# Patient Record
Sex: Female | Born: 1935 | Race: White | Hispanic: No | Marital: Married | Smoking: Never smoker
Health system: Southern US, Community
[De-identification: ages and names within clinical notes are randomized; demographics above are authoritative.]

## PROBLEM LIST (undated history)

## (undated) DIAGNOSIS — I10 Essential (primary) hypertension: Secondary | ICD-10-CM

## (undated) DIAGNOSIS — I4891 Unspecified atrial fibrillation: Secondary | ICD-10-CM

## (undated) DIAGNOSIS — H3530 Unspecified macular degeneration: Secondary | ICD-10-CM

## (undated) DIAGNOSIS — I471 Supraventricular tachycardia, unspecified: Secondary | ICD-10-CM

## (undated) DIAGNOSIS — I2510 Atherosclerotic heart disease of native coronary artery without angina pectoris: Secondary | ICD-10-CM

## (undated) DIAGNOSIS — E785 Hyperlipidemia, unspecified: Secondary | ICD-10-CM

## (undated) DIAGNOSIS — K219 Gastro-esophageal reflux disease without esophagitis: Secondary | ICD-10-CM

## (undated) HISTORY — DX: Supraventricular tachycardia, unspecified: I47.10

## (undated) HISTORY — DX: Supraventricular tachycardia: I47.1

## (undated) HISTORY — DX: Atherosclerotic heart disease of native coronary artery without angina pectoris: I25.10

## (undated) HISTORY — PX: TOTAL ABDOMINAL HYSTERECTOMY: SHX209

## (undated) HISTORY — DX: Unspecified macular degeneration: H35.30

## (undated) HISTORY — DX: Hyperlipidemia, unspecified: E78.5

## (undated) HISTORY — DX: Unspecified atrial fibrillation: I48.91

## (undated) HISTORY — DX: Gastro-esophageal reflux disease without esophagitis: K21.9

## (undated) HISTORY — DX: Essential (primary) hypertension: I10

---

## 2000-05-26 ENCOUNTER — Encounter: Admission: RE | Admit: 2000-05-26 | Discharge: 2000-05-26 | Payer: Self-pay | Admitting: Urology

## 2000-05-26 ENCOUNTER — Encounter: Payer: Self-pay | Admitting: Urology

## 2001-08-17 ENCOUNTER — Encounter: Payer: Self-pay | Admitting: Emergency Medicine

## 2001-08-17 ENCOUNTER — Ambulatory Visit (HOSPITAL_COMMUNITY): Admission: RE | Admit: 2001-08-17 | Discharge: 2001-08-17 | Payer: Self-pay | Admitting: Emergency Medicine

## 2001-09-20 ENCOUNTER — Encounter: Payer: Self-pay | Admitting: Emergency Medicine

## 2001-09-20 ENCOUNTER — Encounter: Admission: RE | Admit: 2001-09-20 | Discharge: 2001-09-20 | Payer: Self-pay | Admitting: Emergency Medicine

## 2001-09-21 ENCOUNTER — Encounter: Payer: Self-pay | Admitting: Emergency Medicine

## 2001-09-21 ENCOUNTER — Encounter: Admission: RE | Admit: 2001-09-21 | Discharge: 2001-09-21 | Payer: Self-pay | Admitting: Emergency Medicine

## 2002-04-13 ENCOUNTER — Ambulatory Visit (HOSPITAL_COMMUNITY): Admission: RE | Admit: 2002-04-13 | Discharge: 2002-04-13 | Payer: Self-pay | Admitting: Cardiology

## 2002-04-13 ENCOUNTER — Encounter: Payer: Self-pay | Admitting: Cardiology

## 2002-05-06 ENCOUNTER — Emergency Department (HOSPITAL_COMMUNITY): Admission: EM | Admit: 2002-05-06 | Discharge: 2002-05-06 | Payer: Self-pay | Admitting: Emergency Medicine

## 2002-05-06 ENCOUNTER — Encounter: Payer: Self-pay | Admitting: Emergency Medicine

## 2002-05-24 ENCOUNTER — Encounter: Admission: RE | Admit: 2002-05-24 | Discharge: 2002-05-24 | Payer: Self-pay | Admitting: Gastroenterology

## 2002-05-24 ENCOUNTER — Encounter: Payer: Self-pay | Admitting: Gastroenterology

## 2002-07-06 ENCOUNTER — Encounter (INDEPENDENT_AMBULATORY_CARE_PROVIDER_SITE_OTHER): Payer: Self-pay | Admitting: Specialist

## 2002-07-06 ENCOUNTER — Ambulatory Visit (HOSPITAL_COMMUNITY): Admission: RE | Admit: 2002-07-06 | Discharge: 2002-07-06 | Payer: Self-pay | Admitting: Gastroenterology

## 2003-05-04 ENCOUNTER — Encounter: Admission: RE | Admit: 2003-05-04 | Discharge: 2003-05-04 | Payer: Self-pay | Admitting: Emergency Medicine

## 2005-07-21 ENCOUNTER — Ambulatory Visit: Payer: Self-pay | Admitting: Cardiology

## 2005-08-05 ENCOUNTER — Ambulatory Visit: Payer: Self-pay

## 2005-08-05 ENCOUNTER — Encounter: Payer: Self-pay | Admitting: Cardiology

## 2005-08-20 ENCOUNTER — Ambulatory Visit: Payer: Self-pay | Admitting: Cardiology

## 2005-08-25 ENCOUNTER — Ambulatory Visit: Payer: Self-pay | Admitting: Internal Medicine

## 2005-11-05 ENCOUNTER — Ambulatory Visit: Payer: Self-pay | Admitting: Internal Medicine

## 2006-01-07 ENCOUNTER — Encounter: Admission: RE | Admit: 2006-01-07 | Discharge: 2006-01-07 | Payer: Self-pay | Admitting: Emergency Medicine

## 2006-02-16 ENCOUNTER — Ambulatory Visit: Payer: Self-pay | Admitting: Cardiology

## 2006-06-05 ENCOUNTER — Encounter: Admission: RE | Admit: 2006-06-05 | Discharge: 2006-06-05 | Payer: Self-pay | Admitting: Emergency Medicine

## 2006-06-24 ENCOUNTER — Ambulatory Visit: Payer: Self-pay | Admitting: Cardiology

## 2006-06-24 LAB — CONVERTED CEMR LAB
CO2: 30 meq/L (ref 19–32)
Calcium: 9.1 mg/dL (ref 8.4–10.5)
Chloride: 107 meq/L (ref 96–112)
Creatinine, Ser: 0.9 mg/dL (ref 0.4–1.2)
Glucose, Bld: 89 mg/dL (ref 70–99)

## 2006-06-29 ENCOUNTER — Ambulatory Visit: Payer: Self-pay | Admitting: Internal Medicine

## 2006-07-02 ENCOUNTER — Ambulatory Visit: Payer: Self-pay | Admitting: Cardiology

## 2006-07-07 ENCOUNTER — Ambulatory Visit: Payer: Self-pay | Admitting: Cardiology

## 2006-07-14 ENCOUNTER — Ambulatory Visit: Payer: Self-pay | Admitting: *Deleted

## 2006-07-21 ENCOUNTER — Ambulatory Visit: Payer: Self-pay | Admitting: Cardiology

## 2006-07-21 ENCOUNTER — Ambulatory Visit: Payer: Self-pay

## 2006-07-21 ENCOUNTER — Encounter: Payer: Self-pay | Admitting: Cardiology

## 2006-07-28 ENCOUNTER — Ambulatory Visit: Payer: Self-pay | Admitting: *Deleted

## 2006-08-03 ENCOUNTER — Ambulatory Visit: Payer: Self-pay | Admitting: Cardiology

## 2006-08-05 ENCOUNTER — Ambulatory Visit (HOSPITAL_COMMUNITY): Admission: RE | Admit: 2006-08-05 | Discharge: 2006-08-05 | Payer: Self-pay | Admitting: Cardiology

## 2006-08-05 ENCOUNTER — Ambulatory Visit: Payer: Self-pay | Admitting: Cardiology

## 2006-08-10 ENCOUNTER — Ambulatory Visit: Payer: Self-pay | Admitting: Cardiology

## 2006-08-24 ENCOUNTER — Ambulatory Visit: Payer: Self-pay | Admitting: Cardiology

## 2006-08-24 ENCOUNTER — Ambulatory Visit: Payer: Self-pay | Admitting: Internal Medicine

## 2006-08-28 ENCOUNTER — Ambulatory Visit: Payer: Self-pay | Admitting: Cardiology

## 2006-09-04 ENCOUNTER — Ambulatory Visit: Payer: Self-pay | Admitting: Internal Medicine

## 2006-09-11 ENCOUNTER — Ambulatory Visit: Payer: Self-pay | Admitting: Cardiology

## 2006-09-18 ENCOUNTER — Ambulatory Visit: Payer: Self-pay | Admitting: Cardiology

## 2006-09-25 ENCOUNTER — Ambulatory Visit: Payer: Self-pay | Admitting: Cardiovascular Disease

## 2006-09-29 ENCOUNTER — Ambulatory Visit: Payer: Self-pay | Admitting: Cardiology

## 2006-10-19 ENCOUNTER — Ambulatory Visit: Payer: Self-pay | Admitting: Cardiology

## 2006-11-09 ENCOUNTER — Ambulatory Visit: Payer: Self-pay | Admitting: Cardiology

## 2006-12-07 ENCOUNTER — Ambulatory Visit: Payer: Self-pay | Admitting: Cardiology

## 2006-12-28 ENCOUNTER — Ambulatory Visit: Payer: Self-pay | Admitting: Internal Medicine

## 2007-01-25 ENCOUNTER — Ambulatory Visit: Payer: Self-pay | Admitting: Internal Medicine

## 2007-02-04 ENCOUNTER — Ambulatory Visit: Payer: Self-pay | Admitting: Cardiology

## 2007-02-04 LAB — CONVERTED CEMR LAB
Basophils Absolute: 0 10*3/uL (ref 0.0–0.1)
Eosinophils Absolute: 0.2 10*3/uL (ref 0.0–0.6)
HCT: 36.8 % (ref 36.0–46.0)
Hemoglobin: 12.8 g/dL (ref 12.0–15.0)
Lymphocytes Relative: 25.2 % (ref 12.0–46.0)
MCHC: 34.7 g/dL (ref 30.0–36.0)
MCV: 90.3 fL (ref 78.0–100.0)
Monocytes Absolute: 0.5 10*3/uL (ref 0.2–0.7)
Neutro Abs: 3 10*3/uL (ref 1.4–7.7)
Neutrophils Relative %: 61.3 % (ref 43.0–77.0)
RDW: 14.4 % (ref 11.5–14.6)

## 2007-02-24 ENCOUNTER — Ambulatory Visit: Payer: Self-pay | Admitting: Cardiology

## 2007-03-24 ENCOUNTER — Ambulatory Visit: Payer: Self-pay | Admitting: Cardiovascular Disease

## 2007-04-14 ENCOUNTER — Ambulatory Visit: Payer: Self-pay | Admitting: Cardiology

## 2007-04-28 ENCOUNTER — Ambulatory Visit: Payer: Self-pay | Admitting: Cardiovascular Disease

## 2007-05-13 ENCOUNTER — Ambulatory Visit: Payer: Self-pay | Admitting: Cardiology

## 2007-05-26 ENCOUNTER — Ambulatory Visit: Payer: Self-pay | Admitting: Cardiology

## 2007-06-03 ENCOUNTER — Ambulatory Visit: Payer: Self-pay | Admitting: Cardiology

## 2007-06-10 ENCOUNTER — Ambulatory Visit: Payer: Self-pay | Admitting: Cardiology

## 2007-06-22 ENCOUNTER — Ambulatory Visit: Payer: Self-pay | Admitting: Cardiology

## 2007-06-29 ENCOUNTER — Ambulatory Visit (HOSPITAL_COMMUNITY): Admission: RE | Admit: 2007-06-29 | Discharge: 2007-06-29 | Payer: Self-pay | Admitting: Cardiology

## 2007-06-29 ENCOUNTER — Ambulatory Visit: Payer: Self-pay | Admitting: Cardiology

## 2007-07-09 ENCOUNTER — Ambulatory Visit: Payer: Self-pay | Admitting: Cardiology

## 2007-07-09 ENCOUNTER — Ambulatory Visit: Payer: Self-pay

## 2007-07-13 ENCOUNTER — Ambulatory Visit: Payer: Self-pay | Admitting: Internal Medicine

## 2007-08-10 ENCOUNTER — Ambulatory Visit: Payer: Self-pay | Admitting: Cardiology

## 2007-08-24 ENCOUNTER — Ambulatory Visit: Payer: Self-pay | Admitting: Cardiology

## 2007-09-07 ENCOUNTER — Ambulatory Visit: Payer: Self-pay | Admitting: Cardiology

## 2007-09-22 ENCOUNTER — Ambulatory Visit: Payer: Self-pay | Admitting: Cardiology

## 2007-10-20 ENCOUNTER — Ambulatory Visit: Payer: Self-pay | Admitting: Cardiovascular Disease

## 2007-11-17 ENCOUNTER — Ambulatory Visit: Payer: Self-pay | Admitting: Cardiology

## 2007-12-01 ENCOUNTER — Ambulatory Visit: Payer: Self-pay | Admitting: Cardiovascular Disease

## 2007-12-22 ENCOUNTER — Ambulatory Visit: Payer: Self-pay | Admitting: Cardiovascular Disease

## 2008-01-24 ENCOUNTER — Ambulatory Visit: Payer: Self-pay | Admitting: Cardiology

## 2008-02-13 DIAGNOSIS — R002 Palpitations: Secondary | ICD-10-CM | POA: Insufficient documentation

## 2008-02-13 DIAGNOSIS — E7800 Pure hypercholesterolemia, unspecified: Secondary | ICD-10-CM | POA: Insufficient documentation

## 2008-02-13 DIAGNOSIS — I471 Supraventricular tachycardia, unspecified: Secondary | ICD-10-CM | POA: Insufficient documentation

## 2008-02-13 DIAGNOSIS — I10 Essential (primary) hypertension: Secondary | ICD-10-CM | POA: Insufficient documentation

## 2008-02-13 DIAGNOSIS — K219 Gastro-esophageal reflux disease without esophagitis: Secondary | ICD-10-CM | POA: Insufficient documentation

## 2008-02-13 DIAGNOSIS — I4891 Unspecified atrial fibrillation: Secondary | ICD-10-CM | POA: Insufficient documentation

## 2008-02-21 ENCOUNTER — Ambulatory Visit: Payer: Self-pay | Admitting: Cardiology

## 2008-03-24 ENCOUNTER — Ambulatory Visit: Payer: Self-pay | Admitting: Cardiovascular Disease

## 2008-04-03 ENCOUNTER — Ambulatory Visit: Payer: Self-pay | Admitting: Cardiology

## 2008-04-05 ENCOUNTER — Encounter: Admission: RE | Admit: 2008-04-05 | Discharge: 2008-04-05 | Payer: Self-pay | Admitting: Emergency Medicine

## 2008-04-17 ENCOUNTER — Encounter: Admission: RE | Admit: 2008-04-17 | Discharge: 2008-04-17 | Payer: Self-pay | Admitting: Emergency Medicine

## 2008-04-18 ENCOUNTER — Ambulatory Visit: Payer: Self-pay | Admitting: Cardiology

## 2008-04-18 LAB — CONVERTED CEMR LAB
Basophils Absolute: 0 10*3/uL (ref 0.0–0.1)
Basophils Relative: 0.2 % (ref 0.0–3.0)
Eosinophils Absolute: 0.2 10*3/uL (ref 0.0–0.7)
MCHC: 34.6 g/dL (ref 30.0–36.0)
MCV: 92.2 fL (ref 78.0–100.0)
Monocytes Relative: 12.7 % — ABNORMAL HIGH (ref 3.0–12.0)
Neutro Abs: 1.8 10*3/uL (ref 1.4–7.7)
Neutrophils Relative %: 51.6 % (ref 43.0–77.0)
RBC: 4.33 M/uL (ref 3.87–5.11)

## 2008-05-02 ENCOUNTER — Ambulatory Visit: Payer: Self-pay | Admitting: Cardiology

## 2008-05-02 LAB — CONVERTED CEMR LAB
Hemoglobin: 12.5 g/dL (ref 12.0–15.0)
Lymphocytes Relative: 27.1 % (ref 12.0–46.0)
Monocytes Relative: 10.5 % (ref 3.0–12.0)
Platelets: 216 10*3/uL (ref 150–400)
RDW: 13.5 % (ref 11.5–14.6)

## 2008-05-16 ENCOUNTER — Ambulatory Visit: Payer: Self-pay | Admitting: Cardiology

## 2008-06-13 ENCOUNTER — Ambulatory Visit: Payer: Self-pay | Admitting: Cardiology

## 2008-07-12 ENCOUNTER — Ambulatory Visit: Payer: Self-pay | Admitting: Cardiology

## 2008-07-12 LAB — CONVERTED CEMR LAB
Basophils Relative: 0.8 % (ref 0.0–3.0)
Eosinophils Relative: 3.6 % (ref 0.0–5.0)
HCT: 39.3 % (ref 36.0–46.0)
Hemoglobin: 13.3 g/dL (ref 12.0–15.0)
Lymphs Abs: 1.1 10*3/uL (ref 0.7–4.0)
MCV: 91.8 fL (ref 78.0–100.0)
Monocytes Absolute: 0.5 10*3/uL (ref 0.1–1.0)
RBC: 4.28 M/uL (ref 3.87–5.11)
WBC: 4.6 10*3/uL (ref 4.5–10.5)

## 2008-08-09 ENCOUNTER — Ambulatory Visit: Payer: Self-pay | Admitting: Internal Medicine

## 2008-08-28 ENCOUNTER — Encounter (INDEPENDENT_AMBULATORY_CARE_PROVIDER_SITE_OTHER): Payer: Self-pay | Admitting: *Deleted

## 2008-09-06 ENCOUNTER — Ambulatory Visit: Payer: Self-pay | Admitting: Cardiology

## 2008-09-12 ENCOUNTER — Encounter: Payer: Self-pay | Admitting: *Deleted

## 2008-10-09 ENCOUNTER — Encounter (INDEPENDENT_AMBULATORY_CARE_PROVIDER_SITE_OTHER): Payer: Self-pay | Admitting: Cardiology

## 2008-10-09 ENCOUNTER — Ambulatory Visit: Payer: Self-pay | Admitting: Cardiovascular Disease

## 2008-10-09 ENCOUNTER — Ambulatory Visit: Payer: Self-pay | Admitting: Cardiology

## 2008-10-18 ENCOUNTER — Encounter: Payer: Self-pay | Admitting: *Deleted

## 2008-11-06 ENCOUNTER — Ambulatory Visit: Payer: Self-pay | Admitting: Cardiovascular Disease

## 2008-12-04 ENCOUNTER — Ambulatory Visit: Payer: Self-pay | Admitting: Cardiology

## 2008-12-04 LAB — CONVERTED CEMR LAB: POC INR: 3.1

## 2008-12-14 ENCOUNTER — Telehealth: Payer: Self-pay | Admitting: Cardiology

## 2008-12-19 ENCOUNTER — Telehealth: Payer: Self-pay | Admitting: Cardiology

## 2008-12-27 ENCOUNTER — Telehealth: Payer: Self-pay | Admitting: Cardiology

## 2009-01-01 ENCOUNTER — Telehealth (INDEPENDENT_AMBULATORY_CARE_PROVIDER_SITE_OTHER): Payer: Self-pay | Admitting: *Deleted

## 2009-01-01 ENCOUNTER — Ambulatory Visit: Payer: Self-pay | Admitting: Cardiology

## 2009-01-01 LAB — CONVERTED CEMR LAB: POC INR: 4.4

## 2009-01-03 ENCOUNTER — Telehealth: Payer: Self-pay | Admitting: Cardiology

## 2009-01-09 ENCOUNTER — Encounter: Admission: RE | Admit: 2009-01-09 | Discharge: 2009-01-09 | Payer: Self-pay | Admitting: Orthopedic Surgery

## 2009-01-15 ENCOUNTER — Ambulatory Visit: Payer: Self-pay | Admitting: Cardiovascular Disease

## 2009-02-05 ENCOUNTER — Ambulatory Visit: Payer: Self-pay | Admitting: Cardiology

## 2009-02-05 LAB — CONVERTED CEMR LAB: POC INR: 2.4

## 2009-03-05 ENCOUNTER — Ambulatory Visit: Payer: Self-pay | Admitting: Cardiology

## 2009-03-05 ENCOUNTER — Ambulatory Visit: Payer: Self-pay | Admitting: Internal Medicine

## 2009-03-05 LAB — CONVERTED CEMR LAB: POC INR: 2.9

## 2009-03-07 LAB — CONVERTED CEMR LAB
Albumin: 3.9 g/dL (ref 3.5–5.2)
Alkaline Phosphatase: 35 units/L — ABNORMAL LOW (ref 39–117)
Basophils Absolute: 0 10*3/uL (ref 0.0–0.1)
Eosinophils Absolute: 0.2 10*3/uL (ref 0.0–0.7)
HCT: 40 % (ref 36.0–46.0)
Lymphocytes Relative: 26.5 % (ref 12.0–46.0)
Lymphs Abs: 1.6 10*3/uL (ref 0.7–4.0)
Monocytes Relative: 4.9 % (ref 3.0–12.0)
Platelets: 200 10*3/uL (ref 150.0–400.0)
RDW: 13.7 % (ref 11.5–14.6)
TSH: 1.71 microintl units/mL (ref 0.35–5.50)

## 2009-03-13 ENCOUNTER — Ambulatory Visit: Payer: Self-pay | Admitting: Cardiology

## 2009-03-13 LAB — CONVERTED CEMR LAB: POC INR: 3.4

## 2009-03-23 ENCOUNTER — Ambulatory Visit: Payer: Self-pay | Admitting: Cardiology

## 2009-03-23 LAB — CONVERTED CEMR LAB: POC INR: 2.2

## 2009-03-27 ENCOUNTER — Ambulatory Visit: Payer: Self-pay | Admitting: Cardiology

## 2009-03-27 ENCOUNTER — Encounter (INDEPENDENT_AMBULATORY_CARE_PROVIDER_SITE_OTHER): Payer: Self-pay | Admitting: *Deleted

## 2009-03-28 ENCOUNTER — Ambulatory Visit (HOSPITAL_COMMUNITY): Admission: RE | Admit: 2009-03-28 | Discharge: 2009-03-28 | Payer: Self-pay | Admitting: Internal Medicine

## 2009-03-29 LAB — CONVERTED CEMR LAB
Basophils Relative: 0.6 % (ref 0.0–3.0)
Eosinophils Relative: 4.4 % (ref 0.0–5.0)
GFR calc non Af Amer: 57.58 mL/min (ref >60)
Glucose, Bld: 108 mg/dL — ABNORMAL HIGH (ref 70–99)
HCT: 38.6 % (ref 36.0–46.0)
Hemoglobin: 13 g/dL (ref 12.0–15.0)
Lymphocytes Relative: 26.2 % (ref 12.0–46.0)
Lymphs Abs: 1.5 10*3/uL (ref 0.7–4.0)
Monocytes Relative: 10.3 % (ref 3.0–12.0)
Neutro Abs: 3.3 10*3/uL (ref 1.4–7.7)
Potassium: 3.9 meq/L (ref 3.5–5.1)
RBC: 4.04 M/uL (ref 3.87–5.11)
Sodium: 140 meq/L (ref 135–145)
WBC: 5.6 10*3/uL (ref 4.5–10.5)

## 2009-04-10 ENCOUNTER — Ambulatory Visit: Payer: Self-pay | Admitting: Cardiology

## 2009-04-10 LAB — CONVERTED CEMR LAB: POC INR: 2.2

## 2009-05-08 ENCOUNTER — Ambulatory Visit: Payer: Self-pay | Admitting: Cardiovascular Disease

## 2009-05-08 LAB — CONVERTED CEMR LAB: POC INR: 2.1

## 2009-06-04 ENCOUNTER — Telehealth: Payer: Self-pay | Admitting: Cardiology

## 2009-06-05 ENCOUNTER — Ambulatory Visit: Payer: Self-pay | Admitting: Cardiology

## 2009-06-05 LAB — CONVERTED CEMR LAB: POC INR: 1.9

## 2009-06-12 ENCOUNTER — Ambulatory Visit: Payer: Self-pay | Admitting: Cardiology

## 2009-06-13 LAB — CONVERTED CEMR LAB
Bilirubin, Direct: 0.1 mg/dL (ref 0.0–0.3)
Total Bilirubin: 0.5 mg/dL (ref 0.3–1.2)

## 2009-07-10 ENCOUNTER — Ambulatory Visit: Payer: Self-pay | Admitting: Internal Medicine

## 2009-07-10 LAB — CONVERTED CEMR LAB: POC INR: 4.8

## 2009-07-19 ENCOUNTER — Telehealth (INDEPENDENT_AMBULATORY_CARE_PROVIDER_SITE_OTHER): Payer: Self-pay | Admitting: *Deleted

## 2009-07-24 ENCOUNTER — Ambulatory Visit: Payer: Self-pay | Admitting: Cardiovascular Disease

## 2009-07-24 LAB — CONVERTED CEMR LAB: POC INR: 3.4

## 2009-08-07 ENCOUNTER — Ambulatory Visit: Payer: Self-pay | Admitting: Cardiovascular Disease

## 2009-08-07 LAB — CONVERTED CEMR LAB: POC INR: 2.9

## 2009-08-20 ENCOUNTER — Telehealth: Payer: Self-pay | Admitting: Cardiology

## 2009-08-24 ENCOUNTER — Ambulatory Visit: Payer: Self-pay | Admitting: Cardiology

## 2009-08-27 ENCOUNTER — Ambulatory Visit: Payer: Self-pay | Admitting: Cardiovascular Disease

## 2009-08-27 ENCOUNTER — Ambulatory Visit: Payer: Self-pay | Admitting: Internal Medicine

## 2009-08-31 ENCOUNTER — Encounter: Payer: Self-pay | Admitting: Internal Medicine

## 2009-09-07 ENCOUNTER — Ambulatory Visit: Payer: Self-pay | Admitting: Internal Medicine

## 2009-09-07 LAB — CONVERTED CEMR LAB
BUN: 16 mg/dL (ref 6–23)
Basophils Absolute: 0 10*3/uL (ref 0.0–0.1)
Calcium: 8.9 mg/dL (ref 8.4–10.5)
Eosinophils Relative: 4.8 % (ref 0.0–5.0)
GFR calc non Af Amer: 64.12 mL/min (ref >60)
Glucose, Bld: 81 mg/dL (ref 70–99)
HCT: 35.3 % — ABNORMAL LOW (ref 36.0–46.0)
Hemoglobin: 12.2 g/dL (ref 12.0–15.0)
INR: 2.9 — ABNORMAL HIGH (ref 0.8–1.0)
Lymphocytes Relative: 29.4 % (ref 12.0–46.0)
Lymphs Abs: 1.2 10*3/uL (ref 0.7–4.0)
Monocytes Relative: 9.9 % (ref 3.0–12.0)
Platelets: 218 10*3/uL (ref 150.0–400.0)
Potassium: 4.3 meq/L (ref 3.5–5.1)
RDW: 15.6 % — ABNORMAL HIGH (ref 11.5–14.6)
Sodium: 143 meq/L (ref 135–145)
WBC: 4 10*3/uL — ABNORMAL LOW (ref 4.5–10.5)
aPTT: 39.1 s — ABNORMAL HIGH (ref 21.7–28.8)

## 2009-09-12 HISTORY — PX: ABLATION OF DYSRHYTHMIC FOCUS: SHX254

## 2009-09-13 ENCOUNTER — Ambulatory Visit (HOSPITAL_COMMUNITY): Admission: RE | Admit: 2009-09-13 | Discharge: 2009-09-13 | Payer: Self-pay | Admitting: Internal Medicine

## 2009-09-13 ENCOUNTER — Encounter: Payer: Self-pay | Admitting: Internal Medicine

## 2009-09-13 ENCOUNTER — Ambulatory Visit: Payer: Self-pay | Admitting: Internal Medicine

## 2009-09-14 ENCOUNTER — Ambulatory Visit (HOSPITAL_COMMUNITY): Admission: RE | Admit: 2009-09-14 | Discharge: 2009-09-15 | Payer: Self-pay | Admitting: Internal Medicine

## 2009-09-14 ENCOUNTER — Ambulatory Visit: Payer: Self-pay | Admitting: Internal Medicine

## 2009-09-17 ENCOUNTER — Telehealth: Payer: Self-pay | Admitting: Internal Medicine

## 2009-09-24 ENCOUNTER — Telehealth: Payer: Self-pay | Admitting: Internal Medicine

## 2009-09-27 ENCOUNTER — Telehealth: Payer: Self-pay | Admitting: Internal Medicine

## 2009-09-27 ENCOUNTER — Encounter: Payer: Self-pay | Admitting: Internal Medicine

## 2009-09-28 ENCOUNTER — Ambulatory Visit: Payer: Self-pay | Admitting: Cardiology

## 2009-09-28 ENCOUNTER — Ambulatory Visit (HOSPITAL_COMMUNITY): Admission: RE | Admit: 2009-09-28 | Discharge: 2009-09-28 | Payer: Self-pay | Admitting: Internal Medicine

## 2009-10-01 ENCOUNTER — Encounter: Payer: Self-pay | Admitting: Cardiology

## 2009-10-01 ENCOUNTER — Ambulatory Visit (HOSPITAL_COMMUNITY): Admission: RE | Admit: 2009-10-01 | Discharge: 2009-10-01 | Payer: Self-pay | Admitting: Cardiology

## 2009-10-01 ENCOUNTER — Ambulatory Visit: Payer: Self-pay | Admitting: Cardiology

## 2009-10-01 ENCOUNTER — Ambulatory Visit: Payer: Self-pay

## 2009-10-02 ENCOUNTER — Encounter: Payer: Self-pay | Admitting: Internal Medicine

## 2009-10-17 ENCOUNTER — Encounter: Payer: Self-pay | Admitting: Cardiology

## 2009-10-19 ENCOUNTER — Ambulatory Visit: Payer: Self-pay | Admitting: Cardiology

## 2009-10-19 ENCOUNTER — Ambulatory Visit: Payer: Self-pay | Admitting: Cardiovascular Disease

## 2009-10-19 LAB — CONVERTED CEMR LAB: POC INR: 2.8

## 2009-11-16 ENCOUNTER — Ambulatory Visit: Payer: Self-pay | Admitting: Cardiovascular Disease

## 2009-11-16 LAB — CONVERTED CEMR LAB: POC INR: 2

## 2009-11-19 ENCOUNTER — Ambulatory Visit: Payer: Self-pay | Admitting: Internal Medicine

## 2009-12-24 ENCOUNTER — Ambulatory Visit: Payer: Self-pay | Admitting: Internal Medicine

## 2010-01-18 ENCOUNTER — Ambulatory Visit: Payer: Self-pay | Admitting: Cardiology

## 2010-02-18 ENCOUNTER — Ambulatory Visit: Payer: Self-pay | Admitting: Internal Medicine

## 2010-02-18 ENCOUNTER — Ambulatory Visit: Payer: Self-pay | Admitting: Cardiovascular Disease

## 2010-03-18 ENCOUNTER — Ambulatory Visit: Payer: Self-pay | Admitting: Cardiovascular Disease

## 2010-04-16 ENCOUNTER — Ambulatory Visit: Admission: RE | Admit: 2010-04-16 | Discharge: 2010-04-16 | Payer: Self-pay | Source: Home / Self Care

## 2010-04-22 ENCOUNTER — Telehealth: Payer: Self-pay | Admitting: Internal Medicine

## 2010-04-30 ENCOUNTER — Ambulatory Visit: Admission: RE | Admit: 2010-04-30 | Discharge: 2010-04-30 | Payer: Self-pay | Source: Home / Self Care

## 2010-05-05 ENCOUNTER — Encounter: Payer: Self-pay | Admitting: Emergency Medicine

## 2010-05-14 NOTE — Medication Information (Signed)
Summary: ccr  Anticoagulant Therapy  Managed by: Kristine Linea, PharmD Referring MD: Olga Millers MD PCP: Jarome Matin, MD Supervising MD: Tenny Craw MD, Gunnar Fusi Indication 1: Atrial Fibrillation (ICD-427.31) Lab Used: [facility] Site: [facility] INR POC 2.6 INR RANGE 2 - 3  Dietary changes: no    Health status changes: no    Bleeding/hemorrhagic complications: no    Recent/future hospitalizations: no    Any changes in medication regimen? no    Recent/future dental: no  Any missed doses?: no       Is patient compliant with meds? yes       Anticoagulation Management History:      Positive risk factors for bleeding include an age of 75 years or older.  The bleeding index is 'intermediate risk'.  Positive CHADS2 values include History of HTN.  Negative CHADS2 values include Age > 61 years old.  The start date was 06/24/2006.  Her last INR was 2.9 ratio.  Anticoagulation responsible provider: Tenny Craw MD, Gunnar Fusi.  INR POC: 2.6.  Exp: 01/2011.    Anticoagulation Management Assessment/Plan:      The patient's current anticoagulation dose is Warfarin sodium 5 mg tabs: Take as directed by coumadin clinic..  The target INR is 2 - 3.  The next INR is due 01/18/2010.  Anticoagulation instructions were given to patient.  Results were reviewed/authorized by Kristine Linea, PharmD.         Prior Anticoagulation Instructions: INR 2.0  Continue Coumadin 1 tablet daily except 1/2 tablet Tue, Thu and Sat. Return to clinic in 4 weeks.  Current Anticoagulation Instructions: Continue taking 1/2 tablets (2.5mg ) on Tue, Thu, and Sat, and take one tablet (5mg ) the rest of the days. and recheck in 4 weeks.

## 2010-05-14 NOTE — Progress Notes (Signed)
Summary: pt not feeling well-EKG  Phone Note Call from Patient Call back at Home Phone [phone]   Caller: Spouse Reason for Call: Talk to Nurse, Talk to Doctor Summary of Call: pt has ablation on the 3rd her heart is still racing, nausea,tired,and sleeping a lot. Husband is concerned and wants to make sure it is normal and she is ok Initial call taken by: Omer Jack,  September 24, 2009 8:51 AM  Follow-up for Phone Call        c/o HR in the 80's to as high as 110.  She thinks she is out of rhythm now. I have asked her to come by for an EKG today. Dennis Bast, RN, BSN  September 24, 2009 9:33 AM per Dr Johney Frame if not in Rhythm by Thurs set up for DCCV     Appended Document: pt not feeling well-EKG per Dr Johney Frame increase Verapamil to 240mg  daily.  pt aware.  Will schedule for DCCV if not back in rhythm by end of week

## 2010-05-14 NOTE — Assessment & Plan Note (Signed)
Summary: d/c amiodarone and decrease Verapamil    Patient Instructions: 1)  Your physician recommends that you schedule a follow-up appointment in: 3 months with Dr Johney Frame 2)  Your physician has recommended you make the following change in your medication: stop Amiodarone and decrease Verapamil to 180mg  daily

## 2010-05-14 NOTE — Assessment & Plan Note (Signed)
Summary: 2 month rov/sl   Primary Provider:  Jarome Matin, MD  CC:  pt complains of pain on the right side of her body also arm  pain.  History of Present Illness: Sylvia [name] is a pleasant female who has a history of paroxysmal atrial fibrillation and SVT.  Her LV function is normal. Last echocardiogram in June of 2011 showed normal LV function. There was trivial aortic and mitral regurgitation.  Also note she had a cardiac catheterization in December 2003 that showed a 20% first obtuse marginal but otherwise no obstructive disease.She was referred for evaluation of atrial fibrillation and ablation. She had this procedure in June of 2011. Following the procedure she did have recurrent atrial fibrillation requiring cardioversion. Since then she did have recurrent atrial fibrillation but apparently converted recently. She does feel fatigue when she is in atrial fibrillation. She otherwise denies dyspnea, chest pain, palpitations or syncope. There is no bleeding. She has had some nausea which has improved over the past 3 days.  Current Medications (verified): 1)  Verapamil Hcl Cr 180 Mg Cr-Tabs (Verapamil Hcl) .... Take 1 and 1/2  Tablet By Mouth Once Daily 2)  Warfarin Sodium 5 Mg Tabs (Warfarin Sodium) .... Take As Directed By Coumadin Clinic. 3)  Amiodarone Hcl 200 Mg Tabs (Amiodarone Hcl) .... One Tablet Daily 4)  Vitamin C 500 Mg Tabs (Ascorbic Acid) .Marland Kitchen.. 1 Tab By Mouth Once Daily 5)  Fish Oil   Oil (Fish Oil) .... Two Tabs By Mouth Once Daily 6)  Multivitamins   Tabs (Multiple Vitamin) .Marland Kitchen.. 1 Tab By Mouth Once Daily 7)  Calcium .Marland Kitchen.. 1 Tab By Mouth Twice Daily 8)  Evista 60 Mg Tabs (Raloxifene Hcl) .... Take 1 Tablet By Mouth Once A Day 9)  Clobetasol Propionate 0.05 % Crea (Clobetasol Propionate) .... Appley Twice Daily As Needed To Scalp 10)  Miralax  Powd (Polyethylene Glycol 3350) .... As Needed 11)  Fluocinonide 0.05 % Soln (Fluocinonide) .Marland Kitchen.. 1-2 Gtts in Ears Twice Weekly As  Needed 12)  Protonix 40 Mg Tbec (Pantoprazole Sodium) .... Take 1 Tablet Twice Daily 13)  Pravachol 20 Mg Tabs (Pravastatin Sodium) .... Take 1 Tablet Each Night. 14)  Tylenol 325 Mg Tabs (Acetaminophen) .... As Needed  Allergies: 1)  ! Pcn 2)  ! Sulfa 3)  ! Reglan  Past History:  Past Medical History: Reviewed history from 08/27/2009 and no changes required. Persistent Atrial Fibrillation G E R D Macular degeneration Hyperlipidemia Hypertension  (pt denies) ?SVT  Past Surgical History: Reviewed history from 08/27/2009 and no changes required. TAH (remote)  Social History: Reviewed history from 08/27/2009 and no changes required. Tobacco Use - No.  Alcohol Use - no Married  Lives in [place].  Retired from [employer].  Review of Systems       problems with nausea and right shoulder pain but no fevers or chills, productive cough, hemoptysis, dysphasia, odynophagia, melena, hematochezia, dysuria, hematuria, rash, seizure activity, orthopnea, PND, pedal edema, claudication. Remaining systems are negative.   Vital Signs:  Patient profile:   77 year old female Height:      61 inches Weight:      154 pounds BMI:     29.20 Pulse rate:   74 / minute Resp:     12 per minute BP sitting:   135 / 74  (left arm)  Vitals Entered By: Kem Parkinson (October 19, 2009 9:36 AM)  Physical Exam  General:  Well-developed well-nourished in no acute distress.  Skin is warm and dry.  HEENT is normal.  Neck is supple. No thyromegaly.  Chest is clear to auscultation with normal expansion.  Cardiovascular exam is regular rate and rhythm.  Abdominal exam nontender or distended. No masses palpated. Extremities show no edema. neuro grossly intact    EKG  Procedure date:  10/19/2009  Findings:      Normal sinus rhythm rate 75. Axis normal. No ST changes.  Impression & Recommendations:  Problem # 1:  ATRIAL FIBRILLATION (ICD-427.31) Patient is in sinus rhythm today. We will  continue with her amiodarone, verapamil and Coumadin. Hopefully over the next 3 months the inflammation from her ablation will resolve and her atrial fibrillation will not recur. We can consider discontinuing her amiodarone at that point. This may be contributing to her nausea. If she continues on this medication then we will plan TSH, liver functions and chest x-ray in 3 months. Her updated medication list for this problem includes:    Warfarin Sodium 5 Mg Tabs (Warfarin sodium) .Marland Kitchen... Take as directed by coumadin clinic.    Amiodarone Hcl 200 Mg Tabs (Amiodarone hcl) ..... One tablet daily  Problem # 2:  COUMADIN THERAPY (ICD-V58.61) Goal INR 2-3.  Problem # 3:  HYPERTENSION, BENIGN (ICD-401.1) Blood pressure controlled. Her updated medication list for this problem includes:    Verapamil Hcl Cr 180 Mg Cr-tabs (Verapamil hcl) .Marland Kitchen... Take 1 and 1/2  tablet by mouth once daily  Problem # 4:  SVT/ PSVT/ PAT (ICD-427.0)  Her updated medication list for this problem includes:    Verapamil Hcl Cr 180 Mg Cr-tabs (Verapamil hcl) .Marland Kitchen... Take 1 and 1/2  tablet by mouth once daily    Warfarin Sodium 5 Mg Tabs (Warfarin sodium) .Marland Kitchen... Take as directed by coumadin clinic.    Amiodarone Hcl 200 Mg Tabs (Amiodarone hcl) ..... One tablet daily  Problem # 5:  HYPERCHOLESTEROLEMIA, PURE (ICD-272.0) Continue statin. Lipids and liver monitored by primary care. Her updated medication list for this problem includes:    Pravachol 20 Mg Tabs (Pravastatin sodium) .Marland Kitchen... Take 1 tablet each night.  Patient Instructions: 1)  Your physician recommends that you schedule a follow-up appointment in: 3 MONTHS

## 2010-05-14 NOTE — Letter (Signed)
Summary: [facility]  [facility]   Imported By: Debby Freiberg 10/17/2009 09:58:25  _____________________________________________________________________  External Attachment:    Type:   Image     Comment:   External Document

## 2010-05-14 NOTE — Medication Information (Signed)
Summary: rov/tm  Anticoagulant Therapy  Managed by: Lynann Bologna, PharmD Referring MD: Olga Millers MD Supervising MD: Clifton James MD, Cristal Deer Indication 1: Atrial Fibrillation (ICD-427.31) Lab Used: [facility] Site: [facility] INR POC 2.4 INR RANGE 2 - 3  Dietary changes: no    Health status changes: no    Bleeding/hemorrhagic complications: no    Recent/future hospitalizations: no    Any changes in medication regimen? no    Recent/future dental: no  Any missed doses?: no       Is patient compliant with meds? yes       Current Medications (verified): 1)  Verapamil Hcl Cr 180 Mg Cr-Tabs (Verapamil Hcl) .... One Tablet By Mouth Once Daily 2)  Warfarin Sodium 5 Mg Tabs (Warfarin Sodium) .... Take As Directed By Coumadin Clinic. 3)  Amiodarone Hcl 200 Mg Tabs (Amiodarone Hcl) .... One Tablet Daily 4)  Vitamin C 500 Mg Tabs (Ascorbic Acid) .Marland Kitchen.. 1 Tab By Mouth Once Daily 5)  Fish Oil   Oil (Fish Oil) .... Two Tabs By Mouth Once Daily 6)  Multivitamins   Tabs (Multiple Vitamin) .Marland Kitchen.. 1 Tab By Mouth Once Daily 7)  Zantac 150 Mg Tabs (Ranitidine Hcl) .Marland Kitchen.. 1 Tab By Mouth Two Times A Day 8)  Prilosec 20 Mg Cpdr (Omeprazole) .Marland Kitchen.. 1 Tab By Mouth Two Times A Day 9)  Calcium .Marland Kitchen.. 1 Tab By Mouth Twice Daily 10)  Evista 60 Mg Tabs (Raloxifene Hcl) .... Take 1 Tablet By Mouth Once A Day 11)  Clobetasol Propionate 0.05 % Crea (Clobetasol Propionate) .... Appley Twice Daily As Needed To Scalp 12)  Miralax  Powd (Polyethylene Glycol 3350) .... As Needed 13)  Fluocinonide 0.05 % Soln (Fluocinonide) .Marland Kitchen.. 1-2 Gtts in Ears Twice Weekly As Needed  Allergies (verified): 1)  ! Pcn 2)  ! Sulfa 3)  ! Reglan  Anticoagulation Management History:      The patient is taking warfarin and comes in today for a routine follow up visit.  Positive risk factors for bleeding include an age of 75 years or older.  The bleeding index is 'intermediate risk'.  Positive CHADS2 values include History of  HTN.  Negative CHADS2 values include Age > 64 years old.  The start date was 06/24/2006.  Her last INR was 2.6 ratio.  Anticoagulation responsible provider: Clifton James MD, Cristal Deer.  INR POC: 2.4.  Cuvette Lot#: [id number].  Exp: 11/2010.    Anticoagulation Management Assessment/Plan:      The patient's current anticoagulation dose is Warfarin sodium 5 mg tabs: Take as directed by coumadin clinic..  The target INR is 2 - 3.  The next INR is due 09/24/2009.  Anticoagulation instructions were given to patient.  Results were reviewed/authorized by Lynann Bologna, PharmD.  She was notified by Lynann Bologna.         Prior Anticoagulation Instructions: INR 2.9 Continue 5mg s everyday except 2.5mg s on Tuesdays, Thursdays and Saturdays. Recheck in 3 weeks.   Current Anticoagulation Instructions: INR 2.4  The patient is to continue with the same dose of coumadin.  This dosage includes: 1 tablet daily, except 1/2 tablet on Tuesdays, Thursdays, and Saturdays.   Next appointment Monday, June 13th at 11:30.

## 2010-05-14 NOTE — Progress Notes (Signed)
Summary: Heart out rhythm, racing ,skipping  Phone Note Call from Patient Call back at [facility] Phone [phone]   Caller: Patient Summary of Call: Pt heart out of rhythm and skipping and racing pt said call her after 3:00 Initial call taken by: Judie Grieve,  Aug 20, 2009 9:19 AM  Follow-up for Phone Call        spoke with pt, she is in atrial fib, she states her heart rate is running 90-105. she states she in general feels bad. she does not feel the racing as bad but when she checks her pulse it is consistantly high. she feels she has been in atrial fib for about 2 weeks now. her meds were confirmed. will foward for dr Jens Som review Deliah Goody, RN  Aug 20, 2009 4:03 PM   Additional Follow-up for Phone Call Additional follow up Details #1::        schedule f/u ov Ferman Hamming, MD, [facility]  Aug 20, 2009 5:28 PM  office visit scheduled Deliah Goody, RN  Aug 20, 2009 5:52 PM

## 2010-05-14 NOTE — Medication Information (Signed)
Summary: rov/mlw  Anticoagulant Therapy  Managed by: Eda Keys, PharmD Referring MD: Olga Millers MD PCP: Jarome Matin, MD Supervising MD: Shirlee Latch MD, Yula Crotwell Indication 1: Atrial Fibrillation (ICD-427.31) Lab Used: [facility] Site: [facility] INR RANGE 2 - 3  Dietary changes: no    Health status changes: no    Bleeding/hemorrhagic complications: no    Recent/future hospitalizations: yes       Details: Patient recently had TEE and ablation performed on 6/2 and 6/3 and patietn underwent cardioversion this past friday.  Any changes in medication regimen? yes       Details: Newly started protonix and pravastatin  Recent/future dental: no  Any missed doses?: no       Is patient compliant with meds? yes       Current Medications (verified): 1)  Verapamil Hcl Cr 180 Mg Cr-Tabs (Verapamil Hcl) .... Take 1 and 1/2  Tablet By Mouth Once Daily 2)  Warfarin Sodium 5 Mg Tabs (Warfarin Sodium) .... Take As Directed By Coumadin Clinic. 3)  Amiodarone Hcl 200 Mg Tabs (Amiodarone Hcl) .... One Tablet Daily 4)  Vitamin C 500 Mg Tabs (Ascorbic Acid) .Marland Kitchen.. 1 Tab By Mouth Once Daily 5)  Fish Oil   Oil (Fish Oil) .... Two Tabs By Mouth Once Daily 6)  Multivitamins   Tabs (Multiple Vitamin) .Marland Kitchen.. 1 Tab By Mouth Once Daily 7)  Zantac 150 Mg Tabs (Ranitidine Hcl) .Marland Kitchen.. 1 Tab By Mouth Two Times A Day 8)  Calcium .Marland Kitchen.. 1 Tab By Mouth Twice Daily 9)  Evista 60 Mg Tabs (Raloxifene Hcl) .... Take 1 Tablet By Mouth Once A Day 10)  Clobetasol Propionate 0.05 % Crea (Clobetasol Propionate) .... Appley Twice Daily As Needed To Scalp 11)  Miralax  Powd (Polyethylene Glycol 3350) .... As Needed 12)  Fluocinonide 0.05 % Soln (Fluocinonide) .Marland Kitchen.. 1-2 Gtts in Ears Twice Weekly As Needed 13)  Protonix 40 Mg Tbec (Pantoprazole Sodium) .... Take 1 Tablet Twice Daily 14)  Pravachol 20 Mg Tabs (Pravastatin Sodium) .... Take 1 Tablet Each Night.  Allergies: 1)  ! Pcn 2)  ! Sulfa 3)  !  Reglan  Anticoagulation Management History:      The patient is taking warfarin and comes in today for a routine follow up visit.  Positive risk factors for bleeding include an age of 58 years or older.  The bleeding index is 'intermediate risk'.  Positive CHADS2 values include History of HTN.  Negative CHADS2 values include Age > 28 years old.  The start date was 06/24/2006.  Her last INR was 2.9 ratio.  Anticoagulation responsible provider: Shirlee Latch MD, Tangelia Sanson.  Cuvette Lot#: [id number].  Exp: 12/2010.    Anticoagulation Management Assessment/Plan:      The patient's current anticoagulation dose is Warfarin sodium 5 mg tabs: Take as directed by coumadin clinic..  The target INR is 2 - 3.  The next INR is due 10/16/2009.  Anticoagulation instructions were given to patient.  Results were reviewed/authorized by Eda Keys, PharmD.  She was notified by Eda Keys.         Prior Anticoagulation Instructions: INR 2.4  The patient is to continue with the same dose of coumadin.  This dosage includes: 1 tablet daily, except 1/2 tablet on Tuesdays, Thursdays, and Saturdays.   Next appointment Monday, June 13th at 11:30.     Current Anticoagulation Instructions: INR 3.6  Do NOT take coumadin today.  Then return to normal dosing schedule of 1/2 tablet on Tuesday, Thursday, and  Saturday, and 1 tablet all other days.  Then return to clinic in 2 weeks.

## 2010-05-14 NOTE — Assessment & Plan Note (Signed)
Summary: F3M/DM   Primary Maguadalupe Lata:  Jarome Matin, MD  CC:  check up.  History of Present Illness: Ms. Primeau is a pleasant female who has a history of paroxysmal atrial fibrillation and SVT.  Her LV function is normal. Last echocardiogram in June of 2011 showed normal LV function. There was trivial aortic and mitral regurgitation.  Also note she had a cardiac catheterization in December 2003 that showed a 20% first obtuse marginal but otherwise no obstructive disease.She was referred for evaluation of atrial fibrillation and ablation. She had this procedure in June of 2011. Following the procedure she did have recurrent atrial fibrillation requiring cardioversion. I last saw her in July of 2011. Since then the patient denies any dyspnea on exertion, orthopnea, PND, pedal edema, palpitations, syncope or chest pain. Patient does have some fatigue.   Current Medications (verified): 1)  Verapamil Hcl Cr 180 Mg Cr-Tabs (Verapamil Hcl) .... Take 1 and 1/2  Tablet By Mouth Once Daily 2)  Warfarin Sodium 5 Mg Tabs (Warfarin Sodium) .... Take As Directed By Coumadin Clinic. 3)  Amiodarone Hcl 200 Mg Tabs (Amiodarone Hcl) .... One Tablet Daily 4)  Vitamin C 500 Mg Tabs (Ascorbic Acid) .Marland Kitchen.. 1 Tab By Mouth Once Daily 5)  Fish Oil   Oil (Fish Oil) .... Two Tabs By Mouth Once Daily 6)  Multivitamins   Tabs (Multiple Vitamin) .Marland Kitchen.. 1 Tab By Mouth Once Daily 7)  Calcium .Marland Kitchen.. 1 Tab By Mouth Twice Daily 8)  Evista 60 Mg Tabs (Raloxifene Hcl) .... Take 1 Tablet By Mouth Once A Day 9)  Clobetasol Propionate 0.05 % Crea (Clobetasol Propionate) .... Appley Twice Daily As Needed To Scalp 10)  Miralax  Powd (Polyethylene Glycol 3350) .... As Needed 11)  Fluocinonide 0.05 % Soln (Fluocinonide) .Marland Kitchen.. 1-2 Gtts in Ears Twice Weekly As Needed 12)  Pravachol 20 Mg Tabs (Pravastatin Sodium) .... Take 1 Tablet Each Night. 13)  Tylenol 325 Mg Tabs (Acetaminophen) .... As Needed 14)  Zantac 75 Mg Tabs (Ranitidine Hcl)  .... One By Mouth Two Times A Day 15)  Prilosec 20 Mg Cpdr (Omeprazole) .Marland Kitchen.. 1  Tab By Mouth Once Daily 16)  Zantac 75 75 Mg Tabs (Ranitidine Hcl) .Marland Kitchen.. 1 Tab By Mouth Once Daily 17)  Mucus Relief 400 Mg Tabs (Guaifenesin) .... As Needed 18)  Claritin 10 Mg Tabs (Loratadine) .Marland Kitchen.. 1 Tab By Mouth Once Daily 19)  Albuterol Sulfate (2.5 Mg/71ml) 0.083% Nebu (Albuterol Sulfate) .... As Directed 20)  Cough Medication .... As Needed  Allergies: 1)  ! Pcn 2)  ! Sulfa 3)  ! Reglan  Past History:  Past Medical History: Reviewed history from 11/19/2009 and no changes required. Persistent Atrial Fibrillation s/p PVI 6/11 G E R D Macular degeneration Hyperlipidemia Hypertension  (pt denies)  Past Surgical History: Reviewed history from 08/27/2009 and no changes required. TAH (remote)  Social History: Reviewed history from 08/27/2009 and no changes required. Tobacco Use - No.  Alcohol Use - no Married  Lives in [place].  Retired from [employer].  Review of Systems       Some fatigue but no fevers or chills, productive cough, hemoptysis, dysphasia, odynophagia, melena, hematochezia, dysuria, hematuria, rash, seizure activity, orthopnea, PND, pedal edema, claudication. Remaining systems are negative.   Vital Signs:  Patient profile:   77 year old female Height:      61 inches Weight:      156 pounds BMI:     29.58 Pulse rate:   76 /  minute Resp:     12 per minute BP sitting:   149 / 70  (left arm)  Vitals Entered By: Kem Parkinson (January 18, 2010 3:12 PM)  Physical Exam  General:  Well-developed well-nourished in no acute distress.  Skin is warm and dry.  HEENT is normal.  Neck is supple. No thyromegaly.  Chest is clear to auscultation with normal expansion.  Cardiovascular exam is regular rate and rhythm.  Abdominal exam nontender or distended. No masses palpated. Extremities show no edema. neuro grossly intact    Impression & Recommendations:  Problem # 1:   COUMADIN THERAPY (ICD-V58.61) Goal INR 2-3.  Problem # 2:  SVT/ PSVT/ PAT (ICD-427.0)  Her updated medication list for this problem includes:    Verapamil Hcl Cr 180 Mg Cr-tabs (Verapamil hcl) .Marland Kitchen... Take 1 and 1/2  tablet by mouth once daily    Warfarin Sodium 5 Mg Tabs (Warfarin sodium) .Marland Kitchen... Take as directed by coumadin clinic.    Amiodarone Hcl 200 Mg Tabs (Amiodarone hcl) ..... One tablet daily  Problem # 3:  HYPERTENSION, BENIGN (ICD-401.1) Blood pressure mildly elevated. We will follow this and adjust as indicated. Her updated medication list for this problem includes:    Verapamil Hcl Cr 180 Mg Cr-tabs (Verapamil hcl) .Marland Kitchen... Take 1 and 1/2  tablet by mouth once daily  Problem # 4:  ATRIAL FIBRILLATION (ICD-427.31) Patient remains in sinus rhythm status post ablation. Continue Coumadin. She will see Dr. Johney Frame back later this month and he will most likely discontinue her amiodarone at that time if she remains in sinus rhythm. Her updated medication list for this problem includes:    Warfarin Sodium 5 Mg Tabs (Warfarin sodium) .Marland Kitchen... Take as directed by coumadin clinic.    Amiodarone Hcl 200 Mg Tabs (Amiodarone hcl) ..... One tablet daily  Problem # 5:  HYPERCHOLESTEROLEMIA, PURE (ICD-272.0) Continue statin. Lipids and liver monitored by primary care. Her updated medication list for this problem includes:    Pravachol 20 Mg Tabs (Pravastatin sodium) .Marland Kitchen... Take 1 tablet each night.  Patient Instructions: 1)  Your physician recommends that you schedule a follow-up appointment in: 6 MONTHS

## 2010-05-14 NOTE — Progress Notes (Signed)
Summary: heart racing,skipping beats  out of rhythm  Phone Note Call from Patient Call back at Home Phone [phone]   Caller: Mom Summary of Call: Pt heart out of rhythm.racing,skipping beats Initial call taken by: Judie Grieve,  June 04, 2009 9:32 AM  Follow-up for Phone Call        spoke with pt, she has been out of rhythm since about wednesday. she feels great and her heart rate is around 87. she was told by the coumadin clinic to call when out of rhythm because of the amiodarone. will let dr Jens Som know Deliah Goody, RN  June 04, 2009 11:28 AM   Additional Follow-up for Phone Call Additional follow up Details #1::        Continue present meds; schedule f/u ov if she does not convert Ferman Hamming, MD, [facility]  June 04, 2009 11:47 AM  pt aware Deliah Goody, RN  June 04, 2009 3:15 PM

## 2010-05-14 NOTE — Letter (Signed)
Summary: ELectrophysiology/Ablation Procedure Instructions  [employer], Main Office  [address]   [place], [place] [address]   Phone: [phone]  Fax: [phone]     Electrophysiology/Ablation Procedure Instructions    You are scheduled for a(n) a-fib ablation on 09/14/09 at 7:30am with Dr. Johney Frame.  1.  Please come to the Short Stay Center at [facility] at 5:30am on the day of your procedure.  2.  Come prepared to stay overnight.   Please bring your insurance cards and a list of your medications.  3.  Come to the [facility] office on 09/07/09 for lab work.    You do not have to be fasting.  4.  Do not have anything to eat or drink after midnight the night before your procedure.  5.  Do NOT take these medications for the morning of your procedure unless otherwise instructed: Verapamil.  All of your remaining medications may be taken with a small amount of water.  6.  Educational material received:  _____ Ablation   * Occasionally, EP studies and ablations can become lengthy.  Please make your family aware of this before your procedure starts.  Average time ranges from 2-8 hours for EP studies/ablations.  Your physician will locate your family after the procedure with the results.  * If you have any questions after you get home, please call the office at [phone].  Anselm Pancoast    TEE---Scheduled for 09/13/09 with Dr Gala Romney  Don't eat or drink after midnight the night before procedure.  Go to Short Stay at [facility].  Be There at 11:30am

## 2010-05-14 NOTE — Medication Information (Signed)
Summary: rov/mwb  Anticoagulant Therapy  Managed by: Weston Brass, PharmD Referring MD: Olga Millers MD PCP: Jarome Matin, MD Supervising MD: Juanda Chance MD, Bruce Indication 1: Atrial Fibrillation (ICD-427.31) Lab Used: [facility] Site: [facility] INR POC 1.8 INR RANGE 2 - 3  Dietary changes: no    Health status changes: yes       Details: Bronchitis, took tri-pak of azithromycin  Bleeding/hemorrhagic complications: no    Recent/future hospitalizations: no    Any changes in medication regimen? no    Recent/future dental: no  Any missed doses?: no       Is patient compliant with meds? yes       Allergies: 1)  ! Pcn 2)  ! Sulfa 3)  ! Reglan  Anticoagulation Management History:      The patient is taking warfarin and comes in today for a routine follow up visit.  Positive risk factors for bleeding include an age of 75 years or older.  The bleeding index is 'intermediate risk'.  Positive CHADS2 values include History of HTN.  Negative CHADS2 values include Age > 27 years old.  The start date was 06/24/2006.  Her last INR was 2.9 ratio.  Anticoagulation responsible provider: Juanda Chance MD, Smitty Cords.  INR POC: 1.8.  Cuvette Lot#: [id number].  Exp: 02/2011.    Anticoagulation Management Assessment/Plan:      The patient's current anticoagulation dose is Warfarin sodium 5 mg tabs: Take as directed by coumadin clinic..  The target INR is 2 - 3.  The next INR is due 02/15/2010.  Anticoagulation instructions were given to patient.  Results were reviewed/authorized by Weston Brass, PharmD.  She was notified by Ilean Skill D candidate.         Prior Anticoagulation Instructions: Continue taking 1/2 tablets (2.5mg ) on Tue, Thu, and Sat, and take one tablet (5mg ) the rest of the days. and recheck in 4 weeks.  Current Anticoagulation Instructions: INR 1.8  Take 1 1/2 tablet today, then continue taking 1 tablet everyday except 1/2 tablet on Tuesday, Thursday, and Saturday.  Recheck in 4  weeks.

## 2010-05-14 NOTE — Assessment & Plan Note (Signed)
Summary: ? a-fib ablation per c[facility]shaw   Visit Type:  Initial Consult Primary Provider:  Jarome Matin, MD   History of Present Illness: Sylvia [name] is a pleasant 77 yo WF with a h/o persistent atrial fibrillation who presents today for EP consultation.  She reports having atrial fibrillation for about 5 years.  She has required cardioverison twice previously.  She failed medical therapy with flecainide and amiodarone.  She reports symptoms of palpitations, fatigue, and decreased exercise tolerance.  She is unaware of any triggers or precipitants for her afib.  Episodes typically begin at rest.  She reports mild associated dizziness.  She denies CP, SOB, or syncope.  She is otherwise without complaint today.  Current Medications (verified): 1)  Verapamil Hcl Cr 180 Mg Cr-Tabs (Verapamil Hcl) .... One Tablet By Mouth Once Daily 2)  Warfarin Sodium 5 Mg Tabs (Warfarin Sodium) .... Take As Directed By Coumadin Clinic. 3)  Amiodarone Hcl 200 Mg Tabs (Amiodarone Hcl) .... One Tablet Daily 4)  Vitamin C 500 Mg Tabs (Ascorbic Acid) .Marland Kitchen.. 1 Tab By Mouth Once Daily 5)  Fish Oil   Oil (Fish Oil) .... Two Tabs By Mouth Once Daily 6)  Multivitamins   Tabs (Multiple Vitamin) .Marland Kitchen.. 1 Tab By Mouth Once Daily 7)  Zantac 150 Mg Tabs (Ranitidine Hcl) .Marland Kitchen.. 1 Tab By Mouth Two Times A Day 8)  Prilosec 20 Mg Cpdr (Omeprazole) .Marland Kitchen.. 1 Tab By Mouth Two Times A Day 9)  Calcium .Marland Kitchen.. 1 Tab By Mouth Twice Daily 10)  Evista 60 Mg Tabs (Raloxifene Hcl) .... Take 1 Tablet By Mouth Once A Day 11)  Clobetasol Propionate 0.05 % Crea (Clobetasol Propionate) .... Appley Twice Daily As Needed To Scalp 12)  Miralax  Powd (Polyethylene Glycol 3350) .... As Needed 13)  Fluocinonide 0.05 % Soln (Fluocinonide) .Marland Kitchen.. 1-2 Gtts in Ears Twice Weekly As Needed  Allergies (verified): 1)  ! Pcn 2)  ! Sulfa 3)  ! Reglan  Past History:  Past Medical History: Persistent Atrial Fibrillation [facility] Macular  degeneration Hyperlipidemia Hypertension  (pt denies) ?SVT  Past Surgical History: TAH (remote)  Family History: CAD, malignancy, stroke  Social History: Tobacco Use - No.  Alcohol Use - no Married  Lives in [place].  Retired from [employer].  Review of Systems       All systems are reviewed and negative except as listed in the HPI.   Vital Signs:  Patient profile:   77 year old female Height:      61 inches Pulse rate:   93 / minute BP sitting:   148 / 84  (left arm)  Vitals Entered By: Laurance Flatten CMA (Aug 27, 2009 3:51 PM)  Physical Exam  General:  Well developed, well nourished, in no acute distress. Head:  normocephalic and atraumatic Eyes:  PERRLA/EOM intact; conjunctiva and lids normal. Nose:  no deformity, discharge, inflammation, or lesions Mouth:  Teeth, gums and palate normal. Oral mucosa normal. Neck:  Neck supple, no JVD. No masses, thyromegaly or abnormal cervical nodes. Lungs:  Clear bilaterally to auscultation and percussion. Heart:  iRRR, no m/r/g Abdomen:  Bowel sounds positive; abdomen soft and non-tender without masses, organomegaly, or hernias noted. No hepatosplenomegaly. Msk:  Back normal, normal gait. Muscle strength and tone normal. Pulses:  pulses normal in all 4 extremities Extremities:  No clubbing or cyanosis. Neurologic:  Alert and oriented x 3. Skin:  Intact without lesions or rashes. Cervical Nodes:  no significant adenopathy Psych:  Normal affect.   Echocardiogram  Procedure date:  07/21/2006  Findings:       SUMMARY   -  New onset atrial fibrillation.   -  Overall left ventricular systolic function was normal. Left         ventricular ejection fraction was estimated , range being 55         % to 65 %. There were no left ventricular regional wall         motion abnormalities.   -  There was mild aortic valvular regurgitation.   -  There was mild mitral valvular regurgitation.   -  The left atrium was mildly dilated.    -  The estimated peak pulmonary artery systolic pressure was mildly         increased.   -  The right atrium was mildly dilated.    ---------------------------------------------------------------   Prepared and Electronically Authenticated by   Olga Millers M.D.  Cardiac Cath  Procedure date:  04/13/2002  Findings:      normal:    FINDINGS:  1. Hemodynamics     A. Left ventricular pressure 131/9 mmHg.     B. Aortic pressure 131/63 mmHg.  2. Ventriculography:  Ejection fraction 65%.  No segmental wall motion     abnormalities.  No mitral regurgitation.  3. Selective coronary angiography     A. The left main coronary artery was a large caliber vessel with no        evidence of flow-limiting disease.     B. The left anterior descending artery was a large caliber vessel        wrapping around the apex.  The diagonal branches were free of flow-        limiting disease as well as the LAD proper.     C. There was a small ramus branch which was free of flow-limiting        disease.     D. The circumflex coronary artery was a large caliber vessel giving rise        to two obtuse marginal branches.  The first obtuse marginal branch had        a diffuse 20% narrowing.     E. The right coronary artery was a large caliber vessel giving rise to a        posterolateral branch and a posterior descending artery.  There was no        flow-limiting disease within this distribution.    RECOMMENDATIONS:  No angiographic evidence of significant epicardial  coronary artery disease.  The patient's chest pain is unlikely cardiac in  origin.  She will be referred to Dr. Lorenz Coaster for further followup.       EKG  Procedure date:  08/24/2009  Findings:      afib, V rate 80s, nonspecific ST/T changes  Impression & Recommendations:  Problem # 1:  ATRIAL FIBRILLATION (ICD-427.31) The patient has symptomatic persistent atrial fibrillation.  She has failed medical therapy with flecainide and  amiodarone.  Therapeutic strategies for afib including medicine and ablation were discussed in detail with the patient today. Risk, benefits, and alternatives to EP study and radiofrequency ablation for afib were also discussed in detail today. These risks include but are not limited to stroke, bleeding, vascular damage, tamponade, perforation, damage to the esophagus, lungs, and other structures, pulmonary vein stenosis, worsening renal function, and death. The patient understands these risk and wishes to proceed.  We will schedule afib ablation  at the next available time.  If her afib symptoms worsen in the interim, then she may require [facility].  Problem # 2:  COUMADIN THERAPY (ICD-V58.61) goal INR 2-3  Problem # 3:  HYPERTENSION, BENIGN (ICD-401.1) stable Her updated medication list for this problem includes:    Verapamil Hcl Cr 180 Mg Cr-tabs (Verapamil hcl) ..... One tablet by mouth once daily  Problem # 4:  HYPERCHOLESTEROLEMIA, PURE (ICD-272.0) stable

## 2010-05-14 NOTE — Medication Information (Signed)
Summary: rov/sel  Anticoagulant Therapy  Managed by: Lyna Poser, PharmD Referring MD: Olga Millers MD PCP: Jarome Matin, MD Supervising MD: Kristeen Miss Indication 1: Atrial Fibrillation (ICD-427.31) Lab Used: [facility] Site: [facility] INR POC 2 INR RANGE 2 - 3  Dietary changes: no    Health status changes: no    Bleeding/hemorrhagic complications: no    Recent/future hospitalizations: no    Any changes in medication regimen? yes       Details: started taking fluoxetine a week ago.  Recent/future dental: no  Any missed doses?: no       Is patient compliant with meds? yes       Allergies: 1)  ! Pcn 2)  ! Sulfa 3)  ! Reglan  Anticoagulation Management History:      Positive risk factors for bleeding include an age of 75 years or older.  The bleeding index is 'intermediate risk'.  Positive CHADS2 values include History of HTN.  Negative CHADS2 values include Age > 75 years old.  The start date was 06/24/2006.  Her last INR was 2.9 ratio.  Anticoagulation responsible provider: Kristeen Miss.  INR POC: 2.  Exp: 02/2011.    Anticoagulation Management Assessment/Plan:      The patient's current anticoagulation dose is Warfarin sodium 5 mg tabs: Take as directed by coumadin clinic..  The target INR is 2 - 3.  The next INR is due 03/18/2010.  Anticoagulation instructions were given to patient.  Results were reviewed/authorized by Lyna Poser, PharmD.  She was notified by Weston Brass PharmD.         Prior Anticoagulation Instructions: INR 1.8  Take 1 1/2 tablet today, then continue taking 1 tablet everyday except 1/2 tablet on Tuesday, Thursday, and Saturday.  Recheck in 4 weeks.   Current Anticoagulation Instructions: INR 2 Continue taking a half tablet on tuesday, thursday, and saturday. And 1 tablet all other days. Recheck in 4 weeks.

## 2010-05-14 NOTE — Medication Information (Signed)
Summary: rov/tm  Anticoagulant Therapy  Managed by: Leota Sauers, PharmD, BCPS, CPP Referring MD: Olga Millers MD Supervising MD: Excell Seltzer MD, Casimiro Needle Indication 1: Atrial Fibrillation (ICD-427.31) Lab Used: [facility] Site: [facility] INR POC 2.1 INR RANGE 2 - 3  Dietary changes: no    Health status changes: no    Bleeding/hemorrhagic complications: no    Recent/future hospitalizations: no    Any changes in medication regimen? no    Recent/future dental: no  Any missed doses?: no       Is patient compliant with meds? yes       Current Medications (verified): 1)  Verapamil Hcl 120 Mg Tabs (Verapamil Hcl) .... Take 1 Tablet By Mouth Twice A Day 2)  Warfarin Sodium 5 Mg Tabs (Warfarin Sodium) .... Take As Directed By Coumadin Clinic. 3)  Amiodarone Hcl 200 Mg Tabs (Amiodarone Hcl) .... Take One Tablet By Mouth Twice A Day X 2 Weeks Then One Tablet Daily 4)  Vitamin C 500 Mg Tabs (Ascorbic Acid) .Marland Kitchen.. 1 Tab By Mouth Once Daily 5)  Fish Oil   Oil (Fish Oil) .... Two Tabs By Mouth Once Daily 6)  Multivitamins   Tabs (Multiple Vitamin) .Marland Kitchen.. 1 Tab By Mouth Once Daily 7)  Zantac 150 Mg Tabs (Ranitidine Hcl) .Marland Kitchen.. 1 Tab By Mouth Two Times A Day 8)  Prilosec 20 Mg Cpdr (Omeprazole) .Marland Kitchen.. 1 Tab By Mouth Two Times A Day 9)  Calcium .Marland Kitchen.. 1 Tab By Mouth Twice Daily 10)  Evista 60 Mg Tabs (Raloxifene Hcl) .... Take 1 Tablet By Mouth Once A Day 11)  Clobetasol Propionate 0.05 % Crea (Clobetasol Propionate) .... Appley Twice Daily As Needed To Scalp 12)  Miralax  Powd (Polyethylene Glycol 3350) .... As Needed 13)  Fluocinonide 0.05 % Soln (Fluocinonide) .Marland Kitchen.. 1-2 Gtts in Ears Twice Weekly As Needed  Allergies: 1)  ! Pcn 2)  ! Sulfa 3)  ! Reglan  Anticoagulation Management History:      The patient is taking warfarin and comes in today for a routine follow up visit.  Positive risk factors for bleeding include an age of 75 years or older.  The bleeding index is 'intermediate risk'.   Positive CHADS2 values include History of HTN.  Negative CHADS2 values include Age > 91 years old.  The start date was 06/24/2006.  Her last INR was 2.6 ratio.  Anticoagulation responsible provider: Excell Seltzer MD, Casimiro Needle.  INR POC: 2.1.  Cuvette Lot#: [id number].  Exp: 05/2010.    Anticoagulation Management Assessment/Plan:      The patient's current anticoagulation dose is Warfarin sodium 5 mg tabs: Take as directed by coumadin clinic..  The target INR is 2 - 3.  The next INR is due 06/05/2009.  Anticoagulation instructions were given to patient.  Results were reviewed/authorized by Leota Sauers, PharmD, BCPS, CPP.         Prior Anticoagulation Instructions: INR 2.2 Continue 5mg s everyday except 2.5mg s on Tuesdays, Thursdays, and Saturdays. Recheck in 4 weeks.   Current Anticoagulation Instructions: INR 2.1  Coumadin 1 tab = 5mg  on Sun, Mon, Wed, Fri 1/2 tab = 2.5mg  on Tue, Thur, Sat

## 2010-05-14 NOTE — Medication Information (Signed)
Summary: rov/tm  Anticoagulant Therapy  Managed by: Bethena Midget, RN, BSN Referring MD: Olga Millers MD Supervising MD: Excell Seltzer MD, Casimiro Needle Indication 1: Atrial Fibrillation (ICD-427.31) Lab Used: [facility] Site: [facility] INR POC 3.4 INR RANGE 2 - 3  Dietary changes: yes       Details: Appetite slowly returning  Health status changes: no    Bleeding/hemorrhagic complications: no    Recent/future hospitalizations: no    Any changes in medication regimen? yes       Details: Tramadol and Flexeril added.   Recent/future dental: no  Any missed doses?: no       Is patient compliant with meds? yes       Current Medications (verified): 1)  Verapamil Hcl 120 Mg Tabs (Verapamil Hcl) .... Take 1 Tablet By Mouth Once Daily 2)  Warfarin Sodium 5 Mg Tabs (Warfarin Sodium) .... Take As Directed By Coumadin Clinic. 3)  Amiodarone Hcl 200 Mg Tabs (Amiodarone Hcl) .... Take One Tablet By Mouth Twice A Day X 2 Weeks Then One Tablet Daily 4)  Vitamin C 500 Mg Tabs (Ascorbic Acid) .Marland Kitchen.. 1 Tab By Mouth Once Daily 5)  Fish Oil   Oil (Fish Oil) .... Two Tabs By Mouth Once Daily 6)  Multivitamins   Tabs (Multiple Vitamin) .Marland Kitchen.. 1 Tab By Mouth Once Daily 7)  Zantac 150 Mg Tabs (Ranitidine Hcl) .Marland Kitchen.. 1 Tab By Mouth Two Times A Day 8)  Prilosec 20 Mg Cpdr (Omeprazole) .Marland Kitchen.. 1 Tab By Mouth Two Times A Day 9)  Calcium .Marland Kitchen.. 1 Tab By Mouth Twice Daily 10)  Evista 60 Mg Tabs (Raloxifene Hcl) .... Take 1 Tablet By Mouth Once A Day 11)  Clobetasol Propionate 0.05 % Crea (Clobetasol Propionate) .... Appley Twice Daily As Needed To Scalp 12)  Miralax  Powd (Polyethylene Glycol 3350) .... As Needed 13)  Fluocinonide 0.05 % Soln (Fluocinonide) .Marland Kitchen.. 1-2 Gtts in Ears Twice Weekly As Needed 14)  Tramadol Hcl 50 Mg Tabs (Tramadol Hcl) .Marland Kitchen.. 1 Tablet Every 6 Hours As Needed 15)  Cyclobenzaprine Hcl 10 Mg Tabs (Cyclobenzaprine Hcl) .... Take 1 Tablet Every 8hours As Needed  Allergies: 1)  ! Pcn 2)  ! Sulfa 3)   ! Reglan  Anticoagulation Management History:      The patient is taking warfarin and comes in today for a routine follow up visit.  Positive risk factors for bleeding include an age of 51 years or older.  The bleeding index is 'intermediate risk'.  Positive CHADS2 values include History of HTN.  Negative CHADS2 values include Age > 54 years old.  The start date was 06/24/2006.  Her last INR was 2.6 ratio.  Anticoagulation responsible provider: Excell Seltzer MD, Casimiro Needle.  INR POC: 3.4.  Cuvette Lot#: [id number].  Exp: 08/2010.    Anticoagulation Management Assessment/Plan:      The patient's current anticoagulation dose is Warfarin sodium 5 mg tabs: Take as directed by coumadin clinic..  The target INR is 2 - 3.  The next INR is due 08/07/2009.  Anticoagulation instructions were given to patient.  Results were reviewed/authorized by Bethena Midget, RN, BSN.  She was notified by Bethena Midget, RN, BSN.         Prior Anticoagulation Instructions: INR 4.8 Skip today' dose. On Wednesday take 1/2 pill then resume normal dose of 1 pill everyday except 1/2 pill on Tuesdays, Thursdays and Saturdays. Recheck in 2 weeks.   Current Anticoagulation Instructions: INR 3.4 Skip today's dose then resume 1 pill everyday  except 1/2 pill on Tuesdays, Thursdays and Saturdays. Recheck in 2 weeks.

## 2010-05-14 NOTE — Medication Information (Signed)
Summary: rov/mw  Anticoagulant Therapy  Managed by: Weston Brass, PharmD Referring MD: Olga Millers MD PCP: Jarome Matin, MD Supervising MD: Eden Emms MD, Theron Arista Indication 1: Atrial Fibrillation (ICD-427.31) Lab Used: [facility] Site: [facility] INR POC 2.3 INR RANGE 2 - 3  Dietary changes: no    Health status changes: no    Bleeding/hemorrhagic complications: no    Recent/future hospitalizations: no    Any changes in medication regimen? yes       Details: stopped amiodarone about 1 month ago  Recent/future dental: no  Any missed doses?: no       Is patient compliant with meds? yes       Current Medications (verified): 1)  Verapamil Hcl Cr 180 Mg Cr-Tabs (Verapamil Hcl) .... Take 1 and  Tablet By Mouth Once Daily 2)  Warfarin Sodium 5 Mg Tabs (Warfarin Sodium) .... Take As Directed By Coumadin Clinic. 3)  Vitamin C 500 Mg Tabs (Ascorbic Acid) .Marland Kitchen.. 1 Tab By Mouth Once Daily 4)  Fish Oil   Oil (Fish Oil) .... Two Tabs By Mouth Once Daily 5)  Multivitamins   Tabs (Multiple Vitamin) .Marland Kitchen.. 1 Tab By Mouth Once Daily 6)  Calcium .Marland Kitchen.. 1 Tab By Mouth Twice Daily 7)  Evista 60 Mg Tabs (Raloxifene Hcl) .... Take 1 Tablet By Mouth Once A Day 8)  Clobetasol Propionate 0.05 % Crea (Clobetasol Propionate) .... Appley Twice Daily As Needed To Scalp 9)  Miralax  Powd (Polyethylene Glycol 3350) .... As Needed 10)  Fluocinonide 0.05 % Soln (Fluocinonide) .Marland Kitchen.. 1-2 Gtts in Ears Twice Weekly As Needed 11)  Pravachol 20 Mg Tabs (Pravastatin Sodium) .... Take 1 Tablet Each Night. 12)  Tylenol 325 Mg Tabs (Acetaminophen) .... As Needed 13)  Prilosec 20 Mg Cpdr (Omeprazole) .Marland Kitchen.. 1  Tab By Mouth Two Times A Day 14)  Mucus Relief 400 Mg Tabs (Guaifenesin) .... As Needed 15)  Claritin 10 Mg Tabs (Loratadine) .Marland Kitchen.. 1 Tab By Mouth Once Daily 16)  Albuterol Sulfate (2.5 Mg/80ml) 0.083% Nebu (Albuterol Sulfate) .... As Directed 17)  Cough Medication .... As Needed 18)  Fluoxetine Hcl 10 Mg Caps  (Fluoxetine Hcl) .... Once Daily  Allergies: 1)  ! Pcn 2)  ! Sulfa 3)  ! Reglan  Anticoagulation Management History:      The patient is taking warfarin and comes in today for a routine follow up visit.  Positive risk factors for bleeding include an age of 75 years or older.  The bleeding index is 'intermediate risk'.  Positive CHADS2 values include History of HTN and Age > 51 years old.  The start date was 06/24/2006.  Her last INR was 2.9 ratio.  Anticoagulation responsible provider: Eden Emms MD, Theron Arista.  INR POC: 2.3.  Cuvette Lot#: [id number].  Exp: 03/2011.    Anticoagulation Management Assessment/Plan:      The patient's current anticoagulation dose is Warfarin sodium 5 mg tabs: Take as directed by coumadin clinic..  The target INR is 2 - 3.  The next INR is due 04/16/2010.  Anticoagulation instructions were given to patient.  Results were reviewed/authorized by Weston Brass, PharmD.  She was notified by Weston Brass PharmD.         Prior Anticoagulation Instructions: INR 2 Continue taking a half tablet on tuesday, thursday, and saturday. And 1 tablet all other days. Recheck in 4 weeks.   Current Anticoagulation Instructions: INR 2.3  Continue same dose of 1 tablet every day except 1/2 tablet on Tuesday, Thursday and Saturday.  Recheck INR in 4 weeks.

## 2010-05-14 NOTE — Miscellaneous (Signed)
Summary: cardioversion  Clinical Lists Changes  Observations: Added new observation of RESULTS MISC:  [facility]      NAME:  JENAVEE, LAGUARDIA              ACCOUNT NO.:  [id number]      MEDICAL RECORD NO.:  [id number]      DATE OF BIRTH:  1932/06/25      PHYSICIAN:  Madolyn Frieze. Jens Som, MD, [facility] VISIT DATE:  09/28/2009                                          OFFICE VISIT      This is cardioversion of atrial fibrillation.  The patient was sedated   with propofol 70 mg intravenously.  Synchronized cardioversion with 120   joules (biphasic) resulted in normal sinus rhythm.  There were no   immediate complications.  We would recommend continuing Coumadin.               Madolyn Frieze Jens Som, MD, [facility]            BSC/MEDQ  D:  09/28/2009  T:  09/29/2009  Job:  [id number]      Electronically Signed by Olga Millers MD [facility] on 10/08/2009 08:34:39 AM   (09/29/2009 9:32)      MISC. Report  Procedure date:  09/29/2009  Findings:       [facility]      NAME:  JO-ANNE, KLUTH              ACCOUNT NO.:  [id number]      MEDICAL RECORD NO.:  [id number]      DATE OF BIRTH:  04/11/1933      PHYSICIAN:  Madolyn Frieze. Jens Som, MD, [facility] VISIT DATE:  09/28/2009                                          OFFICE VISIT      This is cardioversion of atrial fibrillation.  The patient was sedated   with propofol 70 mg intravenously.  Synchronized cardioversion with 120   joules (biphasic) resulted in normal sinus rhythm.  There were no   immediate complications.  We would recommend continuing Coumadin.               Madolyn Frieze Jens Som, MD, [facility]            BSC/MEDQ  D:  09/28/2009  T:  09/29/2009  Job:  [id number]      Electronically Signed by Olga Millers MD [facility] on 10/08/2009 08:34:39 AM

## 2010-05-14 NOTE — Assessment & Plan Note (Signed)
Summary: F3M/DM   History of Present Illness: Sylvia [name] is a pleasant female who has a history of paroxysmal atrial fibrillation and SVT.  Her LV function is normal. Last echocardiogram on July 21, 2006 showed normal LV function. There was mild aortic and mitral regurgitation. There was mild biatrial enlargement. Also note she had a cardiac catheterization in December 2003 that showed a 20% first obtuse marginal but otherwise no obstructive disease. I last saw her in December of 2010. We scheduled her to have a cardioversion. However she was in sinus rhythm on arrival for the procedure. Since then she denies any dyspnea or chest pain. She does occasionally feel her heart "racing" and had palpitations. She also describes occasional dizziness but no syncope. She also complains of fatigue.  Current Medications (verified): 1)  Verapamil Hcl 120 Mg Tabs (Verapamil Hcl) .... Take 1 Tablet By Mouth Twice A Day 2)  Warfarin Sodium 5 Mg Tabs (Warfarin Sodium) .... Take As Directed By Coumadin Clinic. 3)  Amiodarone Hcl 200 Mg Tabs (Amiodarone Hcl) .... Take One Tablet By Mouth Twice A Day X 2 Weeks Then One Tablet Daily 4)  Vitamin C 500 Mg Tabs (Ascorbic Acid) .Marland Kitchen.. 1 Tab By Mouth Once Daily 5)  Fish Oil   Oil (Fish Oil) .... Two Tabs By Mouth Once Daily 6)  Multivitamins   Tabs (Multiple Vitamin) .Marland Kitchen.. 1 Tab By Mouth Once Daily 7)  Zantac 150 Mg Tabs (Ranitidine Hcl) .Marland Kitchen.. 1 Tab By Mouth Two Times A Day 8)  Prilosec 20 Mg Cpdr (Omeprazole) .Marland Kitchen.. 1 Tab By Mouth Two Times A Day 9)  Calcium .Marland Kitchen.. 1 Tab By Mouth Twice Daily 10)  Evista 60 Mg Tabs (Raloxifene Hcl) .... Take 1 Tablet By Mouth Once A Day 11)  Clobetasol Propionate 0.05 % Crea (Clobetasol Propionate) .... Appley Twice Daily As Needed To Scalp 12)  Miralax  Powd (Polyethylene Glycol 3350) .... As Needed 13)  Fluocinonide 0.05 % Soln (Fluocinonide) .Marland Kitchen.. 1-2 Gtts in Ears Twice Weekly As Needed  Allergies: 1)  ! Pcn 2)  ! Sulfa 3)  !  Reglan  Past History:  Past Medical History: Reviewed history from 02/13/2008 and no changes required. Atrial Fibrillation [facility] Hyperlipidemia Hypertension SVT  Social History: Reviewed history from 02/13/2008 and no changes required. Tobacco Use - No.  Alcohol Use - no Married   Review of Systems       Complains of dizziness and fatigue but no fevers or chills, productive cough, hemoptysis, dysphasia, odynophagia, melena, hematochezia, dysuria, hematuria, rash, seizure activity, orthopnea, PND, pedal edema, claudication. Remaining systems are negative.  Vital Signs:  Patient profile:   77 year old female Height:      61 inches Weight:      152 pounds BMI:     28.82 Pulse rate:   63 / minute Resp:     12 per minute BP sitting:   130 / 78  (left arm)  Vitals Entered By: Kem Parkinson (June 12, 2009 9:15 AM)  Physical Exam  General:  Well-developed well-nourished in no acute distress.  Skin is warm and dry.  HEENT is normal.  Neck is supple. No thyromegaly.  Chest is clear to auscultation with normal expansion.  Cardiovascular exam is regular rate and rhythm.  Abdominal exam nontender or distended. No masses palpated. Extremities show no edema. neuro grossly intact    EKG  Procedure date:  06/12/2009  Findings:      Sinus rhythm at a  rate of 63. Axis normal. First degree AV block. No ST changes.  Impression & Recommendations:  Problem # 1:  COUMADIN THERAPY (ICD-V58.61) Goal INR 2-3. Monitored in the Coumadin clinic.  Problem # 2:  ATRIAL FIBRILLATION (ICD-427.31) Patient remains in sinus rhythm. Continue amiodarone and Coumadin. Given fatigue I will decrease verapamil to once daily. Check TSH, liver functions and chest x-ray per amiodarone. Her updated medication list for this problem includes:    Warfarin Sodium 5 Mg Tabs (Warfarin sodium) .Marland Kitchen... Take as directed by coumadin clinic.    Amiodarone Hcl 200 Mg Tabs (Amiodarone hcl) .Marland Kitchen... Take one  tablet by mouth twice a day x 2 weeks then one tablet daily  Orders: TLB-TSH (Thyroid Stimulating Hormone) (84443-TSH) EKG w/ Interpretation (93000) TLB-Hepatic/Liver Function Pnl (80076-HEPATIC) T-2 View CXR (71020TC)  Her updated medication list for this problem includes:    Warfarin Sodium 5 Mg Tabs (Warfarin sodium) .Marland Kitchen... Take as directed by coumadin clinic.    Amiodarone Hcl 200 Mg Tabs (Amiodarone hcl) .Marland Kitchen... Take one tablet by mouth twice a day x 2 weeks then one tablet daily  Problem # 3:  SVT/ PSVT/ PAT (ICD-427.0)  Continue amiodarone. Her updated medication list for this problem includes:    Verapamil Hcl 120 Mg Tabs (Verapamil hcl) .Marland Kitchen... Take 1 tablet by mouth once daily    Warfarin Sodium 5 Mg Tabs (Warfarin sodium) .Marland Kitchen... Take as directed by coumadin clinic.    Amiodarone Hcl 200 Mg Tabs (Amiodarone hcl) .Marland Kitchen... Take one tablet by mouth twice a day x 2 weeks then one tablet daily  Her updated medication list for this problem includes:    Verapamil Hcl 120 Mg Tabs (Verapamil hcl) .Marland Kitchen... Take 1 tablet by mouth once daily    Warfarin Sodium 5 Mg Tabs (Warfarin sodium) .Marland Kitchen... Take as directed by coumadin clinic.    Amiodarone Hcl 200 Mg Tabs (Amiodarone hcl) .Marland Kitchen... Take one tablet by mouth twice a day x 2 weeks then one tablet daily  Problem # 4:  HYPERCHOLESTEROLEMIA, PURE (ICD-272.0) Magement per primary care.  Problem # 5:  HYPERTENSION, BENIGN (ICD-401.1) Blood pressure controlled on present medications. We'll continue to decrease verapamil to once daily secondary to fatigue. Her updated medication list for this problem includes:    Verapamil Hcl 120 Mg Tabs (Verapamil hcl) .Marland Kitchen... Take 1 tablet by mouth once daily  Problem # 6:  GERD (ICD-530.81)  Her updated medication list for this problem includes:    Zantac 150 Mg Tabs (Ranitidine hcl) .Marland Kitchen... 1 tab by mouth two times a day    Prilosec 20 Mg Cpdr (Omeprazole) .Marland Kitchen... 1 tab by mouth two times a day  Patient  Instructions: 1)  Your physician recommends that you schedule a follow-up appointment in:  2)  Your physician has recommended you make the following change in your medication:

## 2010-05-14 NOTE — Progress Notes (Signed)
  Phone Note Call from Patient   Caller: Patient Call For: med changes Summary of Call: Pt called to inform us that she is on Tramadol and Flexeril as needed. She was made of that these do not interact with coumadin.  Initial call taken by: Bethena Midget, RN, BSN,  July 19, 2009 9:37 AM

## 2010-05-14 NOTE — Medication Information (Signed)
Summary: rov/ewj  Anticoagulant Therapy  Managed by: Bethena Midget, RN, BSN Referring MD: Olga Millers MD Supervising MD: Gala Romney MD, Reuel Boom Indication 1: Atrial Fibrillation (ICD-427.31) Lab Used: [facility] Site: [facility] INR POC 4.8 INR RANGE 2 - 3  Dietary changes: yes       Details: Appetite poor  Health status changes: yes       Details: URI  Bleeding/hemorrhagic complications: no    Recent/future hospitalizations: no    Any changes in medication regimen? yes       Details: Zpak took 2 wks for 5days. Finished 10 day course of Prednisone on last Friday. Hydrocodone cough syrup PRN. Added Lasix 40mg s  and Klor-con daily   Recent/future dental: no  Any missed doses?: no       Is patient compliant with meds? yes       Allergies: 1)  ! Pcn 2)  ! Sulfa 3)  ! Reglan  Anticoagulation Management History:      The patient is taking warfarin and comes in today for a routine follow up visit.  Positive risk factors for bleeding include an age of 75 years or older.  The bleeding index is 'intermediate risk'.  Positive CHADS2 values include History of HTN.  Negative CHADS2 values include Age > 81 years old.  The start date was 06/24/2006.  Her last INR was 2.6 ratio.  Anticoagulation responsible provider: Julieann Drummonds MD, Reuel Boom.  INR POC: 4.8.  Cuvette Lot#: [id number].  Exp: 08/2010.    Anticoagulation Management Assessment/Plan:      The patient's current anticoagulation dose is Warfarin sodium 5 mg tabs: Take as directed by coumadin clinic..  The target INR is 2 - 3.  The next INR is due 07/24/2009.  Anticoagulation instructions were given to patient.  Results were reviewed/authorized by Bethena Midget, RN, BSN.  She was notified by Bethena Midget, RN, BSN.         Prior Anticoagulation Instructions: INR 1.9   Take 1 tablet today then resume same dosage 1 tablet daily except 1/2 tablet on Tuesdays, Thursdays, and Saturdays.   Recheck in 4 weeks.     Current  Anticoagulation Instructions: INR 4.8 Skip today' dose. On Wednesday take 1/2 pill then resume normal dose of 1 pill everyday except 1/2 pill on Tuesdays, Thursdays and Saturdays. Recheck in 2 weeks.

## 2010-05-14 NOTE — Assessment & Plan Note (Signed)
Summary: 3 month rov./sl   Visit Type:  Follow-up Primary Viola Placeres:  Jarome Matin, MD   History of Present Illness: The patient presents today for routine electrophysiology followup. She reports doing very well since last being seen in our clinic.  She had early ERAF following ablation but has had no further afib since cardioversion.  She is pleased with the results of the procedure and denies post procedure complications.  The patient denies symptoms of palpitations, chest pain, shortness of breath, orthopnea, PND, lower extremity edema, dizziness, presyncope, syncope, or neurologic sequela.  She has fatigue which she attributes to increased verapamil.  The patient is tolerating medications without difficulties and is otherwise without complaint today.   Current Medications (verified): 1)  Verapamil Hcl Cr 180 Mg Cr-Tabs (Verapamil Hcl) .... Take 1 and 1/2  Tablet By Mouth Once Daily 2)  Warfarin Sodium 5 Mg Tabs (Warfarin Sodium) .... Take As Directed By Coumadin Clinic. 3)  Amiodarone Hcl 200 Mg Tabs (Amiodarone Hcl) .... One Tablet Daily 4)  Vitamin C 500 Mg Tabs (Ascorbic Acid) .Marland Kitchen.. 1 Tab By Mouth Once Daily 5)  Fish Oil   Oil (Fish Oil) .... Two Tabs By Mouth Once Daily 6)  Multivitamins   Tabs (Multiple Vitamin) .Marland Kitchen.. 1 Tab By Mouth Once Daily 7)  Calcium .Marland Kitchen.. 1 Tab By Mouth Twice Daily 8)  Evista 60 Mg Tabs (Raloxifene Hcl) .... Take 1 Tablet By Mouth Once A Day 9)  Clobetasol Propionate 0.05 % Crea (Clobetasol Propionate) .... Appley Twice Daily As Needed To Scalp 10)  Miralax  Powd (Polyethylene Glycol 3350) .... As Needed 11)  Fluocinonide 0.05 % Soln (Fluocinonide) .Marland Kitchen.. 1-2 Gtts in Ears Twice Weekly As Needed 12)  Pravachol 20 Mg Tabs (Pravastatin Sodium) .... Take 1 Tablet Each Night. 13)  Tylenol 325 Mg Tabs (Acetaminophen) .... As Needed 14)  Zantac 75 Mg Tabs (Ranitidine Hcl) .... One By Mouth Two Times A Day 15)  Prilosec 20 Mg Cpdr (Omeprazole) .Marland Kitchen.. 1  Tab By Mouth Once  Daily 16)  Zantac 75 75 Mg Tabs (Ranitidine Hcl) .Marland Kitchen.. 1 Tab By Mouth Once Daily 17)  Mucus Relief 400 Mg Tabs (Guaifenesin) .... As Needed 18)  Claritin 10 Mg Tabs (Loratadine) .Marland Kitchen.. 1 Tab By Mouth Once Daily 19)  Albuterol Sulfate (2.5 Mg/24ml) 0.083% Nebu (Albuterol Sulfate) .... As Directed 20)  Cough Medication .... As Needed 21)  Fluoxetine Hcl 10 Mg Caps (Fluoxetine Hcl) .... Once Daily  Allergies: 1)  ! Pcn 2)  ! Sulfa 3)  ! Reglan  Past History:  Past Medical History: Reviewed history from 11/19/2009 and no changes required. Persistent Atrial Fibrillation s/p PVI 6/11 [facility] Macular degeneration Hyperlipidemia Hypertension  (pt denies)  Past Surgical History: Reviewed history from 08/27/2009 and no changes required. TAH (remote)  Vital Signs:  Patient profile:   77 year old female Height:      61 inches Weight:      156 pounds BMI:     29.58 Pulse rate:   64 / minute BP sitting:   130 / 62  (left arm)  Vitals Entered By: Laurance Flatten CMA (February 18, 2010 9:35 AM)  Physical Exam  General:  Well developed, well nourished, in no acute distress. Head:  normocephalic and atraumatic Eyes:  PERRLA/EOM intact; conjunctiva and lids normal. Mouth:  Teeth, gums and palate normal. Oral mucosa normal. Neck:  Neck supple, no JVD. No masses, thyromegaly or abnormal cervical nodes. Lungs:  Clear bilaterally to  auscultation and percussion. Heart:  Non-displaced PMI, chest non-tender; regular rate and rhythm, S1, S2 without murmurs, rubs or gallops. Carotid upstroke normal, no bruit. Normal abdominal aortic size, no bruits. Femorals normal pulses, no bruits. Pedals normal pulses. No edema, no varicosities. Abdomen:  Bowel sounds positive; abdomen soft and non-tender without masses, organomegaly, or hernias noted. No hepatosplenomegaly. Msk:  Back normal, normal gait. Muscle strength and tone normal. Pulses:  pulses normal in all 4 extremities Extremities:  No clubbing or  cyanosis. Neurologic:  Alert and oriented x 3. Skin:  Intact without lesions or rashes. Cervical Nodes:  no significant adenopathy Psych:  Normal affect.   EKG  Procedure date:  02/18/2010  Findings:      sinus rhythm 64 bpm, PR 212, otherwise normal ekg  Impression & Recommendations:  Problem # 1:  ATRIAL FIBRILLATION (ICD-427.31) doing very well s/p ablation, without afib we will stop amiodarone today decrease verapamil to 180mg  daily continue coumadin  Problem # 2:  COUMADIN THERAPY (ICD-V58.61) goal INR 2-3  Problem # 3:     Other Orders: EKG w/ Interpretation (93000)  Patient Instructions: 1)  return in 3 months 2)  stop amiodarone 3)  decrease verapamil to 180mg  daily

## 2010-05-14 NOTE — Progress Notes (Signed)
Summary: call to [facility]  Phone Note Call from Patient Call back at Home Phone [phone]   Caller: Patient [phone] Reason for Call: Talk to Nurse Summary of Call: pt was told to call kelly today Initial call taken by: Glynda Jaeger,  September 27, 2009 10:49 AM  Follow-up for Phone Call        TEE with Dr Jens Som on 09/28/09 at 2pm  will need H&P and labs prior pt aware to be there at 12 noon NPO after 7am Dennis Bast, RN, BSN  September 27, 2009 12:22 PM

## 2010-05-14 NOTE — Medication Information (Signed)
Summary: ROV COUMADIN - [facility]  Anticoagulant Therapy  Managed by: Cloyde Reams, RN, BSN Referring MD: Olga Millers MD Supervising MD: Daleen Squibb MD, Maisie Fus Indication 1: Atrial Fibrillation (ICD-427.31) Lab Used: [facility] Site: [facility] INR POC 1.9 INR RANGE 2 - 3  Dietary changes: no    Health status changes: no    Bleeding/hemorrhagic complications: no    Recent/future hospitalizations: no    Any changes in medication regimen? no    Recent/future dental: no  Any missed doses?: no       Is patient compliant with meds? yes       Allergies (verified): 1)  ! Pcn 2)  ! Sulfa 3)  ! Reglan  Anticoagulation Management History:      The patient is taking warfarin and comes in today for a routine follow up visit.  Positive risk factors for bleeding include an age of 75 years or older.  The bleeding index is 'intermediate risk'.  Positive CHADS2 values include History of HTN.  Negative CHADS2 values include Age > 68 years old.  The start date was 06/24/2006.  Her last INR was 2.6 ratio.  Anticoagulation responsible provider: Daleen Squibb MD, Maisie Fus.  INR POC: 1.9.  Cuvette Lot#: [id number].  Exp: 07/2010.    Anticoagulation Management Assessment/Plan:      The patient's current anticoagulation dose is Warfarin sodium 5 mg tabs: Take as directed by coumadin clinic..  The target INR is 2 - 3.  The next INR is due 07/03/2009.  Anticoagulation instructions were given to patient.  Results were reviewed/authorized by Cloyde Reams, RN, BSN.  She was notified by Cloyde Reams RN.         Prior Anticoagulation Instructions: INR 2.1  Coumadin 1 tab = 5mg  on Sun, Mon, Wed, Fri 1/2 tab = 2.5mg  on Tue, Thur, Sat  Current Anticoagulation Instructions: INR 1.9   Take 1 tablet today then resume same dosage 1 tablet daily except 1/2 tablet on Tuesdays, Thursdays, and Saturdays.   Recheck in 4 weeks.

## 2010-05-14 NOTE — Medication Information (Signed)
Summary: rov/cb  Anticoagulant Therapy  Managed by: Bethena Midget, RN, BSN Referring MD: Olga Millers MD PCP: Jarome Matin, MD Supervising MD: Eden Emms MD, Theron Arista Indication 1: Atrial Fibrillation (ICD-427.31) Lab Used: [facility] Site: [facility] INR POC 2.8 INR RANGE 2 - 3  Dietary changes: no    Health status changes: yes       Details: Nausea occ. now on Protonix BID  Bleeding/hemorrhagic complications: no    Recent/future hospitalizations: no    Any changes in medication regimen? no    Recent/future dental: no  Any missed doses?: no       Is patient compliant with meds? yes      Comments: Seeing Dr Jens Som today.  Current Medications (verified): 1)  Verapamil Hcl Cr 180 Mg Cr-Tabs (Verapamil Hcl) .... Take 1 and 1/2  Tablet By Mouth Once Daily 2)  Warfarin Sodium 5 Mg Tabs (Warfarin Sodium) .... Take As Directed By Coumadin Clinic. 3)  Amiodarone Hcl 200 Mg Tabs (Amiodarone Hcl) .... One Tablet Daily 4)  Vitamin C 500 Mg Tabs (Ascorbic Acid) .Marland Kitchen.. 1 Tab By Mouth Once Daily 5)  Fish Oil   Oil (Fish Oil) .... Two Tabs By Mouth Once Daily 6)  Multivitamins   Tabs (Multiple Vitamin) .Marland Kitchen.. 1 Tab By Mouth Once Daily 7)  Calcium .Marland Kitchen.. 1 Tab By Mouth Twice Daily 8)  Evista 60 Mg Tabs (Raloxifene Hcl) .... Take 1 Tablet By Mouth Once A Day 9)  Clobetasol Propionate 0.05 % Crea (Clobetasol Propionate) .... Appley Twice Daily As Needed To Scalp 10)  Miralax  Powd (Polyethylene Glycol 3350) .... As Needed 11)  Fluocinonide 0.05 % Soln (Fluocinonide) .Marland Kitchen.. 1-2 Gtts in Ears Twice Weekly As Needed 12)  Protonix 40 Mg Tbec (Pantoprazole Sodium) .... Take 1 Tablet Twice Daily 13)  Pravachol 20 Mg Tabs (Pravastatin Sodium) .... Take 1 Tablet Each Night.  Allergies: 1)  ! Pcn 2)  ! Sulfa 3)  ! Reglan  Anticoagulation Management History:      The patient is taking warfarin and comes in today for a routine follow up visit.  Positive risk factors for bleeding include an age of 89  years or older.  The bleeding index is 'intermediate risk'.  Positive CHADS2 values include History of HTN.  Negative CHADS2 values include Age > 1 years old.  The start date was 06/24/2006.  Her last INR was 2.9 ratio.  Anticoagulation responsible provider: Eden Emms MD, Theron Arista.  INR POC: 2.8.  Cuvette Lot#: [id number].  Exp: 12/2010.    Anticoagulation Management Assessment/Plan:      The patient's current anticoagulation dose is Warfarin sodium 5 mg tabs: Take as directed by coumadin clinic..  The target INR is 2 - 3.  The next INR is due 11/16/2009.  Anticoagulation instructions were given to patient.  Results were reviewed/authorized by Bethena Midget, RN, BSN.  She was notified by Bethena Midget, RN, BSN.         Prior Anticoagulation Instructions: INR 3.6  Do NOT take coumadin today.  Then return to normal dosing schedule of 1/2 tablet on Tuesday, Thursday, and Saturday, and 1 tablet all other days.  Then return to clinic in 2 weeks.    Current Anticoagulation Instructions: INR 2.8 Continue 5mg s daily except 2.5mg s on Tuesdays, Thursdays and Saturdays. Recheck in 4 weeks.

## 2010-05-14 NOTE — Assessment & Plan Note (Signed)
Summary: rov/atrial fib/dm   History of Present Illness: Sylvia [name] is a pleasant female who has a history of paroxysmal atrial fibrillation and SVT.  Her LV function is normal. Last echocardiogram on July 21, 2006 showed normal LV function. There was mild aortic and mitral regurgitation. There was mild biatrial enlargement. Also note she had a cardiac catheterization in December 2003 that showed a 20% first obtuse marginal but otherwise no obstructive disease. I last saw her in March 2011.  Since then she contacted the office feeling that she was in atrial fibrillation. This occurred approximately 3 weeks ago. She feels palpitations she is seen. She has some dizziness but no syncope. She denies any dyspnea or pedal edema. She occasionally feels a pain in her chest for one to 2 seconds but no other pains and no exertional chest pain. she had a cough or cold she is she is a for a were she was increase of breath of his Current Medications (verified): 1)  Verapamil Hcl 120 Mg Tabs (Verapamil Hcl) .... Take 1 Tablet By Mouth Once Daily 2)  Warfarin Sodium 5 Mg Tabs (Warfarin Sodium) .... Take As Directed By Coumadin Clinic. 3)  Amiodarone Hcl 200 Mg Tabs (Amiodarone Hcl) .... One Tablet Daily 4)  Vitamin C 500 Mg Tabs (Ascorbic Acid) .Marland Kitchen.. 1 Tab By Mouth Once Daily 5)  Fish Oil   Oil (Fish Oil) .... Two Tabs By Mouth Once Daily 6)  Multivitamins   Tabs (Multiple Vitamin) .Marland Kitchen.. 1 Tab By Mouth Once Daily 7)  Zantac 150 Mg Tabs (Ranitidine Hcl) .Marland Kitchen.. 1 Tab By Mouth Two Times A Day 8)  Prilosec 20 Mg Cpdr (Omeprazole) .Marland Kitchen.. 1 Tab By Mouth Two Times A Day 9)  Calcium .Marland Kitchen.. 1 Tab By Mouth Twice Daily 10)  Evista 60 Mg Tabs (Raloxifene Hcl) .... Take 1 Tablet By Mouth Once A Day 11)  Clobetasol Propionate 0.05 % Crea (Clobetasol Propionate) .... Appley Twice Daily As Needed To Scalp 12)  Miralax  Powd (Polyethylene Glycol 3350) .... As Needed 13)  Fluocinonide 0.05 % Soln (Fluocinonide) .Marland Kitchen.. 1-2 Gtts in Ears  Twice Weekly As Needed  Allergies: 1)  ! Pcn 2)  ! Sulfa 3)  ! Reglan  Past History:  Past Medical History: Reviewed history from 02/13/2008 and no changes required. Atrial Fibrillation [facility] Hyperlipidemia Hypertension SVT  Social History: Reviewed history from 02/13/2008 and no changes required. Tobacco Use - No.  Alcohol Use - no Married   Review of Systems       Fatigue but no fevers or chills, productive cough, hemoptysis, dysphasia, odynophagia, melena, hematochezia, dysuria, hematuria, rash, seizure activity, orthopnea, PND, pedal edema, claudication. Remaining systems are negative.   Vital Signs:  Patient profile:   77 year old female Height:      61 inches Weight:      153 pounds BMI:     29.01 Pulse rate:   90 / minute Resp:     12 per minute BP sitting:   142 / 81  (left arm)  Vitals Entered By: Kem Parkinson (Aug 24, 2009 12:33 PM)  Physical Exam  General:  Well-developed well-nourished in no acute distress. Axious appearing Skin is warm and dry.  HEENT is normal.  Neck is supple. No thyromegaly.  Chest is clear to auscultation with normal expansion.  Cardiovascular exam irregular Abdominal exam nontender or distended. No masses palpated. Extremities show no edema. neuro grossly intact    Impression & Recommendations:  Problem #  1:  COUMADIN THERAPY (ICD-V58.61) Goal INR 2-3. Monitored in the Coumadin clinic.  Problem # 2:  ATRIAL FIBRILLATION (ICD-427.31) Patient has developed recurrent atrial fibrillation. She failed flecanide previously. She has now had 2 episodes of atrial fibrillation despite amiodarone. I will refer her to Dr. Johney Frame for consideration of ablation. Repeat echocardiogram. Note she is having significant symptoms from her atrial fibrillation. We discussed proceeding with cardioversion as a temporizing measure prior to her being seen by EP. However we decided to continue with rate control for now. Increase her verapamil to  180 mg q. daily. I've asked her to contact us if her symptoms worsen and we will proceed with cardioversion at that time if her INR has been therapeutic for greater than 3 weeks. Her updated medication list for this problem includes:    Warfarin Sodium 5 Mg Tabs (Warfarin sodium) .Marland Kitchen... Take as directed by coumadin clinic.    Amiodarone Hcl 200 Mg Tabs (Amiodarone hcl) ..... One tablet daily  Orders: EP Referral (Cardiology EP Ref ) Echocardiogram (Echo)  Problem # 3:  SVT/ PSVT/ PAT (ICD-427.0)  Her updated medication list for this problem includes:    Verapamil Hcl Cr 180 Mg Cr-tabs (Verapamil hcl) ..... One tablet by mouth once daily    Warfarin Sodium 5 Mg Tabs (Warfarin sodium) .Marland Kitchen... Take as directed by coumadin clinic.    Amiodarone Hcl 200 Mg Tabs (Amiodarone hcl) ..... One tablet daily  Problem # 4:  HYPERCHOLESTEROLEMIA, PURE (ICD-272.0) Management per primary care.  Problem # 5:  HYPERTENSION, BENIGN (ICD-401.1) Blood pressure mildly elevated. Increase verapamil as described above. Her updated medication list for this problem includes:    Verapamil Hcl Cr 180 Mg Cr-tabs (Verapamil hcl) ..... One tablet by mouth once daily  Problem # 6:  GERD (ICD-530.81)  Her updated medication list for this problem includes:    Zantac 150 Mg Tabs (Ranitidine hcl) .Marland Kitchen... 1 tab by mouth two times a day    Prilosec 20 Mg Cpdr (Omeprazole) .Marland Kitchen... 1 tab by mouth two times a day  Patient Instructions: 1)  Your physician recommends that you schedule a follow-up appointment in: 8 WEEKS 2)  You have been referred to DR ALLRED FOR FIB ABLATION 3)  Your physician has requested that you have an echocardiogram.  Echocardiography is a painless test that uses sound waves to create images of your heart. It provides your doctor with information about the size and shape of your heart and how well your heart's chambers and valves are working.  This procedure takes approximately one hour. There are no  restrictions for this procedure. 4)  Your physician has recommended you make the following change in your medication: INCREASE VERAPAMIL 180MG  ONCE DAILY Prescriptions: VERAPAMIL HCL CR 180 MG CR-TABS (VERAPAMIL HCL) ONE TABLET by mouth once daily  #30 x 12   Entered by:   Deliah Goody, RN   Authorized by:   Ferman Hamming, MD, [facility]   Signed by:   Deliah Goody, RN on 08/24/2009   Method used:   Electronically to        [facility] [phone]* (retail)       [address]       [place], [place]  [address]       Ph: [phone]       Fax: [phone]   RxID:   [id number]

## 2010-05-14 NOTE — Medication Information (Signed)
Summary: rov/tm  Anticoagulant Therapy  Managed by: Bethena Midget, RN, BSN Referring MD: Olga Millers MD Supervising MD: Eden Emms MD, Theron Arista Indication 1: Atrial Fibrillation (ICD-427.31) Lab Used: [facility] Site: [facility] INR POC 2.9 INR RANGE 2 - 3  Dietary changes: no    Health status changes: no    Bleeding/hemorrhagic complications: no    Recent/future hospitalizations: no    Any changes in medication regimen? no    Recent/future dental: no  Any missed doses?: no       Is patient compliant with meds? yes       Allergies: 1)  ! Pcn 2)  ! Sulfa 3)  ! Reglan  Anticoagulation Management History:      The patient is taking warfarin and comes in today for a routine follow up visit.  Positive risk factors for bleeding include an age of 75 years or older.  The bleeding index is 'intermediate risk'.  Positive CHADS2 values include History of HTN.  Negative CHADS2 values include Age > 65 years old.  The start date was 06/24/2006.  Her last INR was 2.6 ratio.  Anticoagulation responsible provider: Eden Emms MD, Theron Arista.  INR POC: 2.9.  Cuvette Lot#: [id number].  Exp: 09/2010.    Anticoagulation Management Assessment/Plan:      The patient's current anticoagulation dose is Warfarin sodium 5 mg tabs: Take as directed by coumadin clinic..  The target INR is 2 - 3.  The next INR is due 08/28/2009.  Anticoagulation instructions were given to patient.  Results were reviewed/authorized by Bethena Midget, RN, BSN.  She was notified by Bethena Midget, RN, BSN.         Prior Anticoagulation Instructions: INR 3.4 Skip today's dose then resume 1 pill everyday except 1/2 pill on Tuesdays, Thursdays and Saturdays. Recheck in 2 weeks.   Current Anticoagulation Instructions: INR 2.9 Continue 5mg s everyday except 2.5mg s on Tuesdays, Thursdays and Saturdays. Recheck in 3 weeks.

## 2010-05-14 NOTE — Medication Information (Signed)
Summary: rov/tm  Anticoagulant Therapy  Managed by: Kristine Linea, PharmD Referring MD: Olga Millers MD PCP: Jarome Matin, MD Supervising MD: Eden Emms MD, Theron Arista Indication 1: Atrial Fibrillation (ICD-427.31) Lab Used: [facility] Site: [facility] INR POC 2.0 INR RANGE 2 - 3  Dietary changes: no    Health status changes: no    Bleeding/hemorrhagic complications: no    Recent/future hospitalizations: no    Any changes in medication regimen? no    Recent/future dental: no  Any missed doses?: no       Is patient compliant with meds? yes       Allergies: 1)  ! Pcn 2)  ! Sulfa 3)  ! Reglan  Anticoagulation Management History:      The patient is taking warfarin and comes in today for a routine follow up visit.  Positive risk factors for bleeding include an age of 75 years or older.  The bleeding index is 'intermediate risk'.  Positive CHADS2 values include History of HTN.  Negative CHADS2 values include Age > 19 years old.  The start date was 06/24/2006.  Her last INR was 2.9 ratio.  Anticoagulation responsible provider: Eden Emms MD, Theron Arista.  INR POC: 2.0.  Cuvette Lot#: [id number].  Exp: 01/2011.    Anticoagulation Management Assessment/Plan:      The patient's current anticoagulation dose is Warfarin sodium 5 mg tabs: Take as directed by coumadin clinic..  The target INR is 2 - 3.  The next INR is due 12/13/2009.  Anticoagulation instructions were given to patient.  Results were reviewed/authorized by Kristine Linea, PharmD.  She was notified by Liana Gerold, PharmD Candidate.         Prior Anticoagulation Instructions: INR 2.8 Continue 5mg s daily except 2.5mg s on Tuesdays, Thursdays and Saturdays. Recheck in 4 weeks.   Current Anticoagulation Instructions: INR 2.0  Continue Coumadin 1 tablet daily except 1/2 tablet Tue, Thu and Sat. Return to clinic in 4 weeks.

## 2010-05-14 NOTE — Assessment & Plan Note (Signed)
Summary: eph/post ablation/jml   Visit Type:  Follow-up Primary Provider:  Jarome Matin, MD   History of Present Illness: The patient presents today for routine electrophysiology followup. She reports doing very well since her afib ablation.  She had ERAF in June and early July but has not had any afib for several weeks.  She reports significant improvement in quality of life in sinus rhythm.  She denies procedure related complications.  The patient denies symptoms of palpitations, chest pain, shortness of breath, orthopnea, PND, lower extremity edema, dizziness, presyncope, syncope, or neurologic sequela. The patient is tolerating medications without difficulties and is otherwise without complaint today.   Current Medications (verified): 1)  Verapamil Hcl Cr 180 Mg Cr-Tabs (Verapamil Hcl) .... Take 1 and 1/2  Tablet By Mouth Once Daily 2)  Warfarin Sodium 5 Mg Tabs (Warfarin Sodium) .... Take As Directed By Coumadin Clinic. 3)  Amiodarone Hcl 200 Mg Tabs (Amiodarone Hcl) .... One Tablet Daily 4)  Vitamin C 500 Mg Tabs (Ascorbic Acid) .Marland Kitchen.. 1 Tab By Mouth Once Daily 5)  Fish Oil   Oil (Fish Oil) .... Two Tabs By Mouth Once Daily 6)  Multivitamins   Tabs (Multiple Vitamin) .Marland Kitchen.. 1 Tab By Mouth Once Daily 7)  Calcium .Marland Kitchen.. 1 Tab By Mouth Twice Daily 8)  Evista 60 Mg Tabs (Raloxifene Hcl) .... Take 1 Tablet By Mouth Once A Day 9)  Clobetasol Propionate 0.05 % Crea (Clobetasol Propionate) .... Appley Twice Daily As Needed To Scalp 10)  Miralax  Powd (Polyethylene Glycol 3350) .... As Needed 11)  Fluocinonide 0.05 % Soln (Fluocinonide) .Marland Kitchen.. 1-2 Gtts in Ears Twice Weekly As Needed 12)  Protonix 40 Mg Tbec (Pantoprazole Sodium) .... Take 1 Tablet Twice Daily 13)  Pravachol 20 Mg Tabs (Pravastatin Sodium) .... Take 1 Tablet Each Night. 14)  Tylenol 325 Mg Tabs (Acetaminophen) .... As Needed  Allergies: 1)  ! Pcn 2)  ! Sulfa 3)  ! Reglan  Past History:  Past Medical History: Persistent  Atrial Fibrillation s/p PVI 6/11 G E R D Macular degeneration Hyperlipidemia Hypertension  (pt denies)  Past Surgical History: Reviewed history from 08/27/2009 and no changes required. TAH (remote)  Social History: Reviewed history from 08/27/2009 and no changes required. Tobacco Use - No.  Alcohol Use - no Married  Lives in [place].  Retired from [employer].  Vital Signs:  Patient profile:   77 year old female Height:      61 inches Weight:      157 pounds BMI:     29.77 Pulse rate:   76 / minute BP sitting:   142 / 64  (left arm)  Vitals Entered By: Laurance Flatten CMA (November 19, 2009 10:51 AM)  Physical Exam  General:  Well-developed well-nourished in no acute distress.  Skin is warm and dry.  HEENT is normal.  Neck is supple. No thyromegaly.  Chest is clear to auscultation with normal expansion.  Cardiovascular exam is regular rate and rhythm.  Abdominal exam nontender or distended. No masses palpated. Extremities show no edema. neuro grossly intact    EKG  Procedure date:  11/19/2009  Findings:      sinus rhythm 72 bpm, PR 214, otherwise normal ekg  Impression & Recommendations:  Problem # 1:  ATRIAL FIBRILLATION (ICD-427.31) doing very well s/p afib ablation She has had ERAF, but feels that she has been in sinus rhythm over the past few weeks. We will continue her current medicines as her atria  heal and remodel. If she remains in sinus upon return (3 months), I will likely decrease amiodarone and verapamil at that time. Continue coumadin  Problem # 2:  HYPERTENSION, BENIGN (ICD-401.1) stable no changes today consider adding ACE inhibitor or ARB if BP remains elevated as these medicines may facilitate remodeling of the atria post ablation  Patient Instructions: 1)  Your physician recommends that you schedule a follow-up appointment in: 3 months with Dr Johney Frame 2)  Your physician has recommended you make the following change in your medication: stop  Protonix and go back on Zantac OTC

## 2010-05-14 NOTE — Progress Notes (Signed)
Summary: medication [facility]  Phone Note Call from Patient Call back at [facility] Phone [phone]   Caller: Patient Reason for Call: Talk to Nurse Summary of Call: pt has medication change and she is not sure what she needs to take. was taking Zantac and Dr Johney Frame gave her Protonix she is not sure if he wanted her to take both or just take on.  Initial call taken by: Edman Circle,  September 17, 2009 8:33 AM  Follow-up for Phone Call        stay on Prilosec and Zantac and don't get the Protonix filled Dennis Bast, RN, BSN  September 17, 2009 9:51 AM

## 2010-05-16 NOTE — Medication Information (Signed)
Summary: rov/tp  Anticoagulant Therapy  Managed by: Geoffry Paradise, PharmD Referring MD: Olga Millers MD PCP: Jarome Matin, MD Supervising MD: Eden Emms MD, Theron Arista Indication 1: Atrial Fibrillation (ICD-427.31) Lab Used: [facility] Site: [facility] INR POC 1.7 INR RANGE 2 - 3  Dietary changes: no    Health status changes: no    Bleeding/hemorrhagic complications: no    Recent/future hospitalizations: no    Any changes in medication regimen? no    Recent/future dental: no  Any missed doses?: no       Is patient compliant with meds? yes       Allergies: 1)  ! Pcn 2)  ! Sulfa 3)  ! Reglan  Anticoagulation Management History:      Positive risk factors for bleeding include an age of 34 years or older.  The bleeding index is 'intermediate risk'.  Positive CHADS2 values include History of HTN and Age > 5 years old.  The start date was 06/24/2006.  Her last INR was 2.9 ratio.  Anticoagulation responsible provider: Eden Emms MD, Theron Arista.  INR POC: 1.7.  Cuvette Lot#: [id number].  Exp: 05/2011.    Anticoagulation Management Assessment/Plan:      The patient's current anticoagulation dose is Warfarin sodium 5 mg tabs: Take as directed by coumadin clinic..  The target INR is 2 - 3.  The next INR is due 04/30/2010.  Anticoagulation instructions were given to patient.  Results were reviewed/authorized by Geoffry Paradise, PharmD.         Prior Anticoagulation Instructions: INR 2.3  Continue same dose of 1 tablet every day except 1/2 tablet on Tuesday, Thursday and Saturday.  Recheck INR in 4 weeks.   Current Anticoagulation Instructions: INR:  1.7   Your INR is low today.  Please take Coumadin 1 tablet tonight then resume your normal schedule of 1 tablet everyday except 1/2 a tablet on Tuesdays, Thursdays and Saturdays.  Please return to clinic in 2 weeks for another INR check.   Prescriptions: DEXILANT 60 MG CPDR (DEXLANSOPRAZOLE) One tablet daily  #1 x 1   Entered by:   Geoffry Paradise PharmD   Authorized by:   Marland Kitchen [facility]   Signed by:   Geoffry Paradise PharmD on 04/16/2010   Method used:   Historical   RxID:   [id number]

## 2010-05-16 NOTE — Medication Information (Signed)
Summary: ROV/tp  Anticoagulant Therapy  Managed by: Geoffry Paradise, PharmD Referring MD: Olga Millers MD PCP: Jarome Matin, MD Supervising MD: Jens Som MD, Arlys John Indication 1: Atrial Fibrillation (ICD-427.31) Lab Used: [facility] Site: [facility] INR POC 1.8 INR RANGE 2 - 3  Dietary changes: no    Health status changes: no    Bleeding/hemorrhagic complications: no    Recent/future hospitalizations: no    Any changes in medication regimen? no    Recent/future dental: no  Any missed doses?: no       Is patient compliant with meds? yes       Allergies: 1)  ! Pcn 2)  ! Sulfa 3)  ! Reglan  Anticoagulation Management History:      Positive risk factors for bleeding include an age of 20 years or older.  The bleeding index is 'intermediate risk'.  Positive CHADS2 values include History of HTN and Age > 17 years old.  The start date was 06/24/2006.  Her last INR was 2.9 ratio.  Anticoagulation responsible provider: Jens Som MD, Arlys John.  INR POC: 1.8.  Cuvette Lot#: [id number].  Exp: 05/2011.    Anticoagulation Management Assessment/Plan:      The patient's current anticoagulation dose is Warfarin sodium 5 mg tabs: Take as directed by coumadin clinic..  The target INR is 2 - 3.  The next INR is due 05/22/2010.  Anticoagulation instructions were given to patient.  Results were reviewed/authorized by Geoffry Paradise, PharmD.         Prior Anticoagulation Instructions: INR:  1.7   Your INR is low today.  Please take Coumadin 1 tablet tonight then resume your normal schedule of 1 tablet everyday except 1/2 a tablet on Tuesdays, Thursdays and Saturdays.  Please return to clinic in 2 weeks for another INR check.    Current Anticoagulation Instructions: INR:  1.8 (goal 2-3)  Your INR is low today and has been low for the past visits.  Take 1 full tablet of Coumadin tonight.  Your new schedule is 1 tablet every day except 1/2 a tablet on Thursdays and Saturdays.    Return to  clinic in 3 weeks for another INR along with your Doctor visit.

## 2010-05-16 NOTE — Progress Notes (Signed)
Summary: heart out rythnm  Phone Note Call from Patient Call back at Home Phone [phone]   Caller: Patient Summary of Call: Pt heart out rhythm on Saturday and four hour yesterday back in rhythm now,can leave mess if no answer Initial call taken by: Judie Grieve,  April 22, 2010 10:47 AM  Follow-up for Phone Call        discussed with DrAllred no changes for now.  Continue meds as they are and let us know if she goes back out of rhythm.  Pt verbalized understanding Dennis Bast, RN, BSN  April 22, 2010 11:45 AM

## 2010-05-23 ENCOUNTER — Ambulatory Visit (INDEPENDENT_AMBULATORY_CARE_PROVIDER_SITE_OTHER): Payer: Medicare Other | Admitting: Internal Medicine

## 2010-05-23 ENCOUNTER — Telehealth (INDEPENDENT_AMBULATORY_CARE_PROVIDER_SITE_OTHER): Payer: Self-pay | Admitting: *Deleted

## 2010-05-23 ENCOUNTER — Encounter (INDEPENDENT_AMBULATORY_CARE_PROVIDER_SITE_OTHER): Payer: Medicare Other

## 2010-05-23 ENCOUNTER — Encounter: Payer: Self-pay | Admitting: Internal Medicine

## 2010-05-23 ENCOUNTER — Encounter (INDEPENDENT_AMBULATORY_CARE_PROVIDER_SITE_OTHER): Payer: Self-pay | Admitting: Pharmacist

## 2010-05-23 ENCOUNTER — Other Ambulatory Visit: Payer: Self-pay | Admitting: Internal Medicine

## 2010-05-23 DIAGNOSIS — R5383 Other fatigue: Secondary | ICD-10-CM

## 2010-05-23 DIAGNOSIS — Z7901 Long term (current) use of anticoagulants: Secondary | ICD-10-CM

## 2010-05-23 DIAGNOSIS — R5381 Other malaise: Secondary | ICD-10-CM | POA: Insufficient documentation

## 2010-05-23 DIAGNOSIS — I4891 Unspecified atrial fibrillation: Secondary | ICD-10-CM

## 2010-05-23 DIAGNOSIS — I10 Essential (primary) hypertension: Secondary | ICD-10-CM

## 2010-05-23 LAB — CBC WITH DIFFERENTIAL/PLATELET
Eosinophils Absolute: 0.1 10*3/uL (ref 0.0–0.7)
Eosinophils Relative: 2.1 % (ref 0.0–5.0)
MCHC: 34.2 g/dL (ref 30.0–36.0)
MCV: 94.9 fl (ref 78.0–100.0)
Monocytes Absolute: 0.4 10*3/uL (ref 0.1–1.0)
Neutrophils Relative %: 62.3 % (ref 43.0–77.0)
Platelets: 204 10*3/uL (ref 150.0–400.0)
WBC: 4.3 10*3/uL — ABNORMAL LOW (ref 4.5–10.5)

## 2010-05-23 LAB — T4, FREE: Free T4: 0.3 ng/dL — ABNORMAL LOW (ref 0.60–1.60)

## 2010-05-23 LAB — TSH: TSH: 55.42 u[IU]/mL — ABNORMAL HIGH (ref 0.35–5.50)

## 2010-05-23 LAB — CONVERTED CEMR LAB: POC INR: 2.4

## 2010-05-30 NOTE — Assessment & Plan Note (Signed)
Summary: 3 mth rov/sl/kwb   Visit Type:  Follow-up Primary Provider:  Jarome Matin, MD   History of Present Illness:  The patient presents today for routine electrophysiology followup. She reports doing very well since last being seen in our clinic.  She is maintaining sinus rhythm.  The patient denies symptoms of chest pain, shortness of breath, orthopnea, PND, lower extremity edema, dizziness, presyncope, syncope, or neurologic sequela. She reports occasional palpitations.  Her primary concern is with ongoing fatigue.  She does not feel well rested in the am or throughout the day.  She reports feeling "sluggish".  Her fatigue did not improve with stopping amiodarone or with reduced verapamil. The patient is tolerating medications without difficulties and is otherwise without complaint today.   Current Medications (verified): 1)  Verapamil Hcl Cr 180 Mg Cr-Tabs (Verapamil Hcl) .... Take 1 and  Tablet By Mouth Once Daily 2)  Warfarin Sodium 5 Mg Tabs (Warfarin Sodium) .... Take As Directed By Coumadin Clinic. 3)  Vitamin C 500 Mg Tabs (Ascorbic Acid) .Marland Kitchen.. 1 Tab By Mouth Once Daily 4)  Fish Oil   Oil (Fish Oil) .... Two Tabs By Mouth Once Daily 5)  Multivitamins   Tabs (Multiple Vitamin) .Marland Kitchen.. 1 Tab By Mouth Once Daily 6)  Calcium .Marland Kitchen.. 1 Tab By Mouth Twice Daily 7)  Evista 60 Mg Tabs (Raloxifene Hcl) .... Take 1 Tablet By Mouth Once A Day 8)  Miralax  Powd (Polyethylene Glycol 3350) .... As Needed 9)  Fluocinonide 0.05 % Soln (Fluocinonide) .Marland Kitchen.. 1-2 Gtts in Ears Twice Weekly As Needed 10)  Pravachol 20 Mg Tabs (Pravastatin Sodium) .... Take 1 Tablet Each Night. 11)  Tylenol 325 Mg Tabs (Acetaminophen) .... As Needed 12)  Mucus Relief 400 Mg Tabs (Guaifenesin) .... As Needed 13)  Cough Medication .... As Needed 14)  Fluoxetine Hcl 10 Mg Caps (Fluoxetine Hcl) .... Once Daily 15)  Pantoprazole Sodium 40 Mg Tbec (Pantoprazole Sodium) .Marland Kitchen.. 1 Tablet Twice Daily  Allergies: 1)  ! Pcn 2)  !  Sulfa 3)  ! Reglan  Past History:  Past Medical History: Reviewed history from 11/19/2009 and no changes required. Persistent Atrial Fibrillation s/p PVI 6/11 [facility] Macular degeneration Hyperlipidemia Hypertension  (pt denies)  Past Surgical History: Reviewed history from 08/27/2009 and no changes required. TAH (remote)  Social History: Reviewed history from 08/27/2009 and no changes required. Tobacco Use - No.  Alcohol Use - no Married  Lives in [place].  Retired from [employer].  Review of Systems       All systems are reviewed and negative except as listed in the HPI.   Vital Signs:  Patient profile:   77 year old female Height:      61 inches Weight:      150 pounds BMI:     28.44 Pulse rate:   70 / minute BP sitting:   166 / 82  (right arm)  Vitals Entered By: Laurance Flatten CMA (May 23, 2010 9:41 AM)  Physical Exam  General:  Well developed, well nourished, in no acute distress. Head:  normocephalic and atraumatic Eyes:  PERRLA/EOM intact; conjunctiva and lids normal. Mouth:  Teeth, gums and palate normal. Oral mucosa normal. Neck:  Neck supple, no JVD. No masses, thyromegaly or abnormal cervical nodes. Lungs:  Clear bilaterally to auscultation and percussion. Heart:  Non-displaced PMI, chest non-tender; regular rate and rhythm, S1, S2 without murmurs, rubs or gallops. Carotid upstroke normal, no bruit. Normal abdominal aortic  size, no bruits. Femorals normal pulses, no bruits. Pedals normal pulses. No edema, no varicosities. Abdomen:  Bowel sounds positive; abdomen soft and non-tender without masses, organomegaly, or hernias noted. No hepatosplenomegaly. Msk:  Back normal, normal gait. Muscle strength and tone normal. Extremities:  No clubbing or cyanosis. Neurologic:  Alert and oriented x 3. Skin:  Intact without lesions or rashes. Psych:  Normal affect.   EKG  Procedure date:  05/23/2010  Findings:      sinus rhythm 70 bpm, PR 200, QRS 86,  QTc 462, otherwise normal ekg  Impression & Recommendations:  Problem # 1:  ATRIAL FIBRILLATION (ICD-427.31) doing very well s/p ablation, maintaining sinus rhythm off of amiodarone she has had occasional palpitations but not prolonged afib documented continue verapamil 180mg  daily continue coumadin  Problem # 2:  HYPERTENSION, BENIGN (ICD-401.1) stable  Problem # 3:  FATIGUE (ICD-780.79) unclear etiology,  she denies snoring/ apnea and does not wish to have sleep study performed we will evaluate for anemia/ thyroid dysfunction with CBC and TFTs  Other Orders: TLB-CBC Platelet - w/Differential (85025-CBCD) TLB-T4 (Thyrox), Free [id number]) TLB-TSH (Thyroid Stimulating Hormone) (56213-YQM)  Patient Instructions: 1)  Your physician recommends that you schedule a follow-up appointment in: 3 months with Dr Johney Frame 2)  Your physician recommends that you continue on your current medications as directed. Please refer to the Current Medication list given to you today.

## 2010-05-30 NOTE — Medication Information (Addendum)
Summary: Coumadin Clinic  Anticoagulant Therapy  Managed by: Eda Keys, PharmD Referring MD: Olga Millers MD PCP: Jarome Matin, MD Supervising MD: nahser Indication 1: Atrial Fibrillation (ICD-427.31) Lab Used: [facility] Site: [facility] INR POC 2.4 INR RANGE 2 - 3  Dietary changes: no    Health status changes: no    Bleeding/hemorrhagic complications: no    Recent/future hospitalizations: no    Any changes in medication regimen? no    Recent/future dental: no  Any missed doses?: no       Is patient compliant with meds? yes       Current Medications (verified): 1)  Verapamil Hcl Cr 180 Mg Cr-Tabs (Verapamil Hcl) .... Take 1 and  Tablet By Mouth Once Daily 2)  Warfarin Sodium 5 Mg Tabs (Warfarin Sodium) .... Take As Directed By Coumadin Clinic. 3)  Vitamin C 500 Mg Tabs (Ascorbic Acid) .Marland Kitchen.. 1 Tab By Mouth Once Daily 4)  Fish Oil   Oil (Fish Oil) .... Two Tabs By Mouth Once Daily 5)  Multivitamins   Tabs (Multiple Vitamin) .Marland Kitchen.. 1 Tab By Mouth Once Daily 6)  Calcium .Marland Kitchen.. 1 Tab By Mouth Twice Daily 7)  Evista 60 Mg Tabs (Raloxifene Hcl) .... Take 1 Tablet By Mouth Once A Day 8)  Clobetasol Propionate 0.05 % Crea (Clobetasol Propionate) .... [place] Twice Daily As Needed To Scalp 9)  Miralax  Powd (Polyethylene Glycol 3350) .... As Needed 10)  Fluocinonide 0.05 % Soln (Fluocinonide) .Marland Kitchen.. 1-2 Gtts in Ears Twice Weekly As Needed 11)  Pravachol 20 Mg Tabs (Pravastatin Sodium) .... Take 1 Tablet Each Night. 12)  Tylenol 325 Mg Tabs (Acetaminophen) .... As Needed 13)  Mucus Relief 400 Mg Tabs (Guaifenesin) .... As Needed 14)  Claritin 10 Mg Tabs (Loratadine) .Marland Kitchen.. 1 Tab By Mouth Once Daily 15)  Albuterol Sulfate (2.5 Mg/14ml) 0.083% Nebu (Albuterol Sulfate) .... As Directed 16)  Cough Medication .... As Needed 17)  Fluoxetine Hcl 10 Mg Caps (Fluoxetine Hcl) .... Once Daily 18)  Pantoprazole Sodium 40 Mg Tbec (Pantoprazole Sodium) .Marland Kitchen.. 1 Tablet Twice  Daily  Allergies (verified): 1)  ! Pcn 2)  ! Sulfa 3)  ! Reglan  Anticoagulation Management History:      The patient is taking warfarin and comes in today for a routine follow up visit.  Positive risk factors for bleeding include an age of 59 years or older.  The bleeding index is 'intermediate risk'.  Positive CHADS2 values include History of HTN and Age > 5 years old.  The start date was 06/24/2006.  Her last INR was 2.9 ratio.  Anticoagulation responsible provider: nahser.  INR POC: 2.4.  Cuvette Lot#: [id number].  Exp: 04/2011.    Anticoagulation Management Assessment/Plan:      The patient's current anticoagulation dose is Warfarin sodium 5 mg tabs: Take as directed by coumadin clinic..  The target INR is 2 - 3.  The next INR is due 06/20/2010.  Anticoagulation instructions were given to patient.  Results were reviewed/authorized by Eda Keys, PharmD.  She was notified by Eda Keys.         Prior Anticoagulation Instructions: INR:  1.8 (goal 2-3)  Your INR is low today and has been low for the past visits.  Take 1 full tablet of Coumadin tonight.  Your new schedule is 1 tablet every day except 1/2 a tablet on Thursdays and Saturdays.    Return to clinic in 3 weeks for another INR along with your Doctor visit.  Current Anticoagulation Instructions: INR 2.4  Continue current dosing schedule of 1/2 tablet on Thursday and Saturday and 1 tablet all other days.  Return to clinic in 4 weeks.

## 2010-06-03 DIAGNOSIS — I4891 Unspecified atrial fibrillation: Secondary | ICD-10-CM

## 2010-06-05 NOTE — Progress Notes (Signed)
  Phone Note Call from Patient   Reason for Call: Talk to Nurse Summary of Call: pt calling to give you the names of the meds she's on for her eye-avastin and the abx tobramycine Initial call taken by: Glynda Jaeger,  May 23, 2010 1:43 PM

## 2010-06-24 ENCOUNTER — Encounter (INDEPENDENT_AMBULATORY_CARE_PROVIDER_SITE_OTHER): Payer: Medicare Other

## 2010-06-24 ENCOUNTER — Encounter: Payer: Self-pay | Admitting: Cardiology

## 2010-06-24 DIAGNOSIS — I4891 Unspecified atrial fibrillation: Secondary | ICD-10-CM

## 2010-06-24 DIAGNOSIS — Z7901 Long term (current) use of anticoagulants: Secondary | ICD-10-CM

## 2010-06-30 LAB — PROTIME-INR
INR: 3.61 — ABNORMAL HIGH (ref 0.00–1.49)
Prothrombin Time: 35.7 seconds — ABNORMAL HIGH (ref 11.6–15.2)

## 2010-06-30 LAB — CBC
HCT: 35.8 % — ABNORMAL LOW (ref 36.0–46.0)
MCHC: 34.4 g/dL (ref 30.0–36.0)
MCV: 92.3 fL (ref 78.0–100.0)
RBC: 3.88 MIL/uL (ref 3.87–5.11)
WBC: 4.5 10*3/uL (ref 4.0–10.5)

## 2010-06-30 LAB — BASIC METABOLIC PANEL
BUN: 13 mg/dL (ref 6–23)
CO2: 27 mEq/L (ref 19–32)
Chloride: 109 mEq/L (ref 96–112)
Creatinine, Ser: 0.98 mg/dL (ref 0.4–1.2)
GFR calc Af Amer: >60 mL/min (ref >60)
Potassium: 4.3 mEq/L (ref 3.5–5.1)

## 2010-07-01 LAB — PROTIME-INR: INR: 2.91 — ABNORMAL HIGH (ref 0.00–1.49)

## 2010-07-02 NOTE — Medication Information (Signed)
Summary: rov/tm  Anticoagulant Therapy  Managed by: Samantha Crimes, PharmD Referring MD: Olga Millers MD PCP: Jarome Matin, MD Supervising MD: Antoine Poche MD, Fayrene Fearing Indication 1: Atrial Fibrillation (ICD-427.31) Lab Used: [facility] Site: [facility] INR POC 1.9 INR RANGE 2 - 3  Dietary changes: no    Health status changes: no    Bleeding/hemorrhagic complications: no    Recent/future hospitalizations: no    Any changes in medication regimen? yes       Details: just finished 3 days of cipro, adding levo 50 mcg  Recent/future dental: no  Any missed doses?: no       Is patient compliant with meds? yes       Current Medications (verified): 1)  Verapamil Hcl Cr 180 Mg Cr-Tabs (Verapamil Hcl) .... Take 1 and  Tablet By Mouth Once Daily 2)  Warfarin Sodium 5 Mg Tabs (Warfarin Sodium) .... Take As Directed By Coumadin Clinic. 3)  Vitamin C 500 Mg Tabs (Ascorbic Acid) .Marland Kitchen.. 1 Tab By Mouth Once Daily 4)  Fish Oil   Oil (Fish Oil) .... Two Tabs By Mouth Once Daily 5)  Multivitamins   Tabs (Multiple Vitamin) .Marland Kitchen.. 1 Tab By Mouth Once Daily 6)  Calcium .Marland Kitchen.. 1 Tab By Mouth Twice Daily 7)  Evista 60 Mg Tabs (Raloxifene Hcl) .... Take 1 Tablet By Mouth Once A Day 8)  Miralax  Powd (Polyethylene Glycol 3350) .... As Needed 9)  Fluocinonide 0.05 % Soln (Fluocinonide) .Marland Kitchen.. 1-2 Gtts in Ears Twice Weekly As Needed 10)  Pravachol 20 Mg Tabs (Pravastatin Sodium) .... Take 1 Tablet Each Night. 11)  Tylenol 325 Mg Tabs (Acetaminophen) .... As Needed 12)  Mucus Relief 400 Mg Tabs (Guaifenesin) .... As Needed 13)  Cough Medication .... As Needed 14)  Fluoxetine Hcl 10 Mg Caps (Fluoxetine Hcl) .... Once Daily 15)  Pantoprazole Sodium 40 Mg Tbec (Pantoprazole Sodium) .Marland Kitchen.. 1 Tablet Twice Daily  Allergies (verified): 1)  ! Pcn 2)  ! Sulfa 3)  ! Reglan  Anticoagulation Management History:      Positive risk factors for bleeding include an age of 75 years or older.  The bleeding index is  'intermediate risk'.  Positive CHADS2 values include History of HTN and Age > 75 years old.  The start date was 06/24/2006.  Her last INR was 2.9 ratio.  Anticoagulation responsible provider: Antoine Poche MD, Fayrene Fearing.  INR POC: 1.9.  Exp: 04/2011.    Anticoagulation Management Assessment/Plan:      The patient's current anticoagulation dose is Warfarin sodium 5 mg tabs: Take as directed by coumadin clinic..  The target INR is 2 - 3.  The next INR is due 07/22/2010.  Anticoagulation instructions were given to patient.  Results were reviewed/authorized by Samantha Crimes, PharmD.         Prior Anticoagulation Instructions: INR 2.4  Continue current dosing schedule of 1/2 tablet on Thursday and Saturday and 1 tablet all other days.  Return to clinic in 4 weeks.    Current Anticoagulation Instructions: Cont with current regimen Return to the clinic 4 weeks on 4/9 @ 8:45 am

## 2010-07-12 ENCOUNTER — Telehealth: Payer: Self-pay | Admitting: Cardiology

## 2010-07-12 NOTE — Telephone Encounter (Signed)
Walk In Pt Form" PT dropped off paper from [facility] " to be completed sent to [facility]ss[facility] Nurse 07/12/10/KM

## 2010-07-22 ENCOUNTER — Encounter: Payer: Medicare Other | Admitting: *Deleted

## 2010-07-26 ENCOUNTER — Ambulatory Visit (INDEPENDENT_AMBULATORY_CARE_PROVIDER_SITE_OTHER): Payer: Medicare Other | Admitting: *Deleted

## 2010-07-26 ENCOUNTER — Ambulatory Visit (INDEPENDENT_AMBULATORY_CARE_PROVIDER_SITE_OTHER): Payer: Medicare Other | Admitting: Cardiology

## 2010-07-26 ENCOUNTER — Encounter: Payer: Self-pay | Admitting: Cardiology

## 2010-07-26 DIAGNOSIS — I10 Essential (primary) hypertension: Secondary | ICD-10-CM

## 2010-07-26 DIAGNOSIS — E7800 Pure hypercholesterolemia, unspecified: Secondary | ICD-10-CM

## 2010-07-26 DIAGNOSIS — I471 Supraventricular tachycardia: Secondary | ICD-10-CM

## 2010-07-26 DIAGNOSIS — Z7901 Long term (current) use of anticoagulants: Secondary | ICD-10-CM | POA: Insufficient documentation

## 2010-07-26 DIAGNOSIS — I4891 Unspecified atrial fibrillation: Secondary | ICD-10-CM

## 2010-07-26 LAB — POCT INR: INR: 2.5

## 2010-07-26 NOTE — Assessment & Plan Note (Signed)
-   Continue verapamil 

## 2010-07-26 NOTE — Assessment & Plan Note (Signed)
Continue statin. Managed by primary care. 

## 2010-07-26 NOTE — Assessment & Plan Note (Signed)
Blood pressure elevated today. However she states normal at home. She will continue to follow. She will keep records and bring those with her next visit. She will also bring her cuff to correlate. Further adjustments based on follow up results.

## 2010-07-26 NOTE — Patient Instructions (Signed)
Your physician recommends that you schedule a follow-up appointment in: 1 year with Dr. Crenshaw. 

## 2010-07-26 NOTE — Progress Notes (Signed)
HPI: [name] is a pleasant female who has a history of paroxysmal atrial fibrillation and SVT.  Her LV function is normal. Last echocardiogram in June of 2011 showed normal LV function. There was trivial aortic and mitral regurgitation.  Also note she had a cardiac catheterization in December 2003 that showed a 20% first obtuse marginal but otherwise no obstructive disease. She was referred for atrial fibrillation ablation. She had this procedure in June of 2011. Following the procedure she did have recurrent atrial fibrillation requiring cardioversion and transient use of amiodarone which has now been discontinued. I last saw her in Oct of 2011. Since then she has occasional palpitations but not sustained. No chest pain, dyspnea or syncope.  She has been diagnosed with hypothyroid.  Current Outpatient Prescriptions  Medication Sig Dispense Refill  . acetaminophen (TYLENOL) 100 MG/ML solution Take 10 mg/kg by mouth every 4 (four) hours as needed.        . Ascorbic Acid (VITAMIN C) 500 MG CAPS Take by mouth daily.       . Calcium Carb-Cholecalciferol (CALCIUM 1000 + D PO) Take by mouth. TWICE A DAY        . fish oil-omega-3 fatty acids 1000 MG capsule 1 tab po bid      . fluocinonide (LIDEX) 0.05 % external solution 2 (two) times daily. 1-2 GTTS IN EARS TWICE WEEKLY AS NEEDED       . guaiFENesin (ROBITUSSIN) 100 MG/5ML liquid Take 200 mg by mouth 3 (three) times daily as needed.        Marland Kitchen levothyroxine (SYNTHROID, LEVOTHROID) 75 MCG tablet Take 75 mcg by mouth daily.        . Multiple Vitamin (MULTIVITAMIN) tablet Take 1 tablet by mouth daily.        . pantoprazole (PROTONIX) 40 MG tablet Take 40 mg by mouth 2 (two) times daily.        . polyethylene glycol powder (GLYCOLAX/MIRALAX) powder Take 17 g by mouth daily. PRN        . pravastatin (PRAVACHOL) 20 MG tablet Take 20 mg by mouth daily.        . raloxifene (EVISTA) 60 MG tablet Take 60 mg by mouth daily.        . verapamil (CALAN-SR) 180 MG  CR tablet Take 180 mg by mouth at bedtime.        Marland Kitchen warfarin (COUMADIN) 5 MG tablet Take by mouth as directed.        Marland Kitchen DISCONTD: FLUoxetine (PROZAC) 10 MG capsule Take 10 mg by mouth daily.        Marland Kitchen DISCONTD: pantoprazole (PROTONIX) 20 MG tablet Take 20 mg by mouth daily. BID           Past Medical History  Diagnosis Date  . Arrhythmia     ATRIAL FIBRILLATION S/P  PVI 6/11  . GERD (gastroesophageal reflux disease)   . Macular degeneration   . Hyperlipidemia   . SVT (supraventricular tachycardia)   . Hypertension     Past Surgical History  Procedure Date  . Total abdominal hysterectomy     History   Social History  . Marital Status: Married    Spouse Name: N/A    Number of Children: N/A  . Years of Education: N/A   Occupational History  . Not on file.   Social History Main Topics  . Smoking status: Never Smoker   . Smokeless tobacco: Not on file  . Alcohol Use: No  . Drug Use:  Not on file  . Sexually Active: Not on file   Other Topics Concern  . Not on file   Social History Narrative  . No narrative on file    ROS: no fevers or chills, productive cough, hemoptysis, dysphasia, odynophagia, melena, hematochezia, dysuria, hematuria, rash, seizure activity, orthopnea, PND, pedal edema, claudication. Remaining systems are negative.  Physical Exam: Well-developed well-nourished in no acute distress.  Skin is warm and dry.  HEENT is normal.  Neck is supple. No thyromegaly.  Chest is clear to auscultation with normal expansion.  Cardiovascular exam is regular rate and rhythm.  Abdominal exam nontender or distended. No masses palpated. Extremities show no edema. neuro grossly intact

## 2010-07-26 NOTE — Assessment & Plan Note (Signed)
Status post ablation. Continue Coumadin. Followup Dr. Johney Frame.

## 2010-08-19 ENCOUNTER — Encounter: Payer: Self-pay | Admitting: Internal Medicine

## 2010-08-19 ENCOUNTER — Ambulatory Visit (INDEPENDENT_AMBULATORY_CARE_PROVIDER_SITE_OTHER): Payer: Medicare Other | Admitting: *Deleted

## 2010-08-19 ENCOUNTER — Ambulatory Visit (INDEPENDENT_AMBULATORY_CARE_PROVIDER_SITE_OTHER): Payer: Medicare Other | Admitting: Internal Medicine

## 2010-08-19 VITALS — BP 132/70 | HR 70 | Ht 61.0 in | Wt 154.0 lb

## 2010-08-19 DIAGNOSIS — I10 Essential (primary) hypertension: Secondary | ICD-10-CM

## 2010-08-19 DIAGNOSIS — I4891 Unspecified atrial fibrillation: Secondary | ICD-10-CM

## 2010-08-19 LAB — POCT INR: INR: 2.2

## 2010-08-19 NOTE — Progress Notes (Signed)
The patient presents today for routine electrophysiology followup.  Since last being seen in our clinic, the patient reports doing very well.  She is unaware of any further symptomatic atrial fibrillation off AAD therapy and reports that her palpitations have resolved.  She remains very active and is pleased with the results of her ablation.  Her fatigue has improved significantly with treatment of thyroid dysfunction. Today, she denies symptoms of chest pain, shortness of breath, orthopnea, PND, lower extremity edema, dizziness, presyncope, syncope, or neurologic sequela.  The patient feels that she is tolerating medications without difficulties and is otherwise without complaint today.   Past Medical History  Diagnosis Date  . Atrial fibrillation     ATRIAL FIBRILLATION S/P  PVI 6/11  . GERD (gastroesophageal reflux disease)   . Macular degeneration   . Hyperlipidemia   . Hypertension    Past Surgical History  Procedure Date  . Total abdominal hysterectomy     Current Outpatient Prescriptions  Medication Sig Dispense Refill  . acetaminophen (TYLENOL) 100 MG/ML solution Take 10 mg/kg by mouth every 4 (four) hours as needed.        . Ascorbic Acid (VITAMIN C) 500 MG CAPS Take by mouth daily.       . Calcium Carb-Cholecalciferol (CALCIUM 1000 + D PO) Take by mouth. TWICE A DAY        . fish oil-omega-3 fatty acids 1000 MG capsule 1 tab po bid      . fluocinonide (LIDEX) 0.05 % external solution 2 (two) times daily. 1-2 GTTS IN EARS TWICE WEEKLY AS NEEDED       . gabapentin (NEURONTIN) 100 MG capsule Take 100 mg by mouth as directed.        Marland Kitchen guaiFENesin (ROBITUSSIN) 100 MG/5ML liquid Take 200 mg by mouth 3 (three) times daily as needed.        Marland Kitchen levothyroxine (SYNTHROID, LEVOTHROID) 75 MCG tablet Take 75 mcg by mouth daily.        . Multiple Vitamin (MULTIVITAMIN) tablet Take 1 tablet by mouth daily.        . pantoprazole (PROTONIX) 40 MG tablet Take 40 mg by mouth 2 (two) times daily.         . polyethylene glycol powder (GLYCOLAX/MIRALAX) powder Take 17 g by mouth daily. PRN        . pravastatin (PRAVACHOL) 20 MG tablet Take 20 mg by mouth daily.        . raloxifene (EVISTA) 60 MG tablet Take 60 mg by mouth daily.        . verapamil (CALAN-SR) 180 MG CR tablet Take 180 mg by mouth at bedtime.        Marland Kitchen warfarin (COUMADIN) 5 MG tablet Take by mouth as directed.          Allergies  Allergen Reactions  . Metoclopramide Hcl     REACTION: nervous, climbs the wall  . Penicillins     REACTION: hives  . Sulfonamide Derivatives     REACTION: rash    History   Social History  . Marital Status: Married    Spouse Name: N/A    Number of Children: N/A  . Years of Education: N/A   Occupational History  . Not on file.   Social History Main Topics  . Smoking status: Never Smoker   . Smokeless tobacco: Not on file  . Alcohol Use: No  . Drug Use: Not on file  . Sexually Active: Not on file  Other Topics Concern  . Not on file   Social History Narrative   Lives with spouse    Family History  Problem Relation Age of Onset  . Stroke Other   . Coronary artery disease Other    Physical Exam: Filed Vitals:   08/19/10 0951  BP: 132/70  Pulse: 70  Height: 5\' 1"  (1.549 m)  Weight: 154 lb (69.854 kg)    GEN- The patient is well appearing, alert and oriented x 3 today.   Head- normocephalic, atraumatic Eyes-  Sclera clear, conjunctiva pink Ears- hearing intact Oropharynx- clear Neck- supple, no JVP Lymph- no cervical lymphadenopathy Lungs- Clear to ausculation bilaterally, normal work of breathing Heart- Regular rate and rhythm, no murmurs, rubs or gallops, PMI not laterally displaced GI- soft, NT, ND, + BS Extremities- no clubbing, cyanosis, or edema MS- no significant deformity or atrophy Skin- no rash or lesion Psych- euthymic mood, full affect Neuro- strength and sensation are intact  EKG today reveals sinus rhythm 70 bpm, PR 194, QRS 86, Qtc 466,  otherwise normal ekg  Assessment and Plan:

## 2010-08-19 NOTE — Assessment & Plan Note (Signed)
Doing well s/p PVI without recurrence Her CHADS2 score is 2 (age and HTN)  We will therefore continue coumadin at this time. We will decrease verapamil to 90mg  po qhs if BP allows.

## 2010-08-19 NOTE — Patient Instructions (Signed)
Your physician wants you to follow-up in: 6 months with Dr Jacquiline Doe will receive a reminder letter in the mail two months in advance. If you don't receive a letter, please call our office to schedule the follow-up appointment.  Your physician has recommended you make the following change in your medication: decrease the pm dose of Verapamil 180mg  1/2 tablet the pm dose

## 2010-08-19 NOTE — Assessment & Plan Note (Signed)
Review of her home journal reveals good blood pressure control at home She will continue to follow her BP closely as we decrease verapamil.

## 2010-08-23 ENCOUNTER — Encounter: Payer: Medicare Other | Admitting: *Deleted

## 2010-08-27 NOTE — Assessment & Plan Note (Signed)
[facility]                            CARDIOLOGY OFFICE NOTE   Sylvia [name], Sylvia [name]                     MRN:          [id number]  DATE:09/29/2006                            DOB:          1932-09-07    Sylvia [name] is a pleasant female who has a history of atrial  fibrillation.  When I last saw her, she had developed recurrent atrial  fibrillation.  We plan to begin Coumadin and once she had been  therapeutic, proceed with cardioversion and initiation of flecainide;  however, she decided she did not want to proceed with that.  Since then,  she denies any dyspnea, chest pain, palpitations, or syncope.  She does  occasionally feel a skip.   Her medications include Evista 60 mg p.o. daily, calcium 1 daily,  aspirin 81 mg p.o. daily, multivitamin, fish oil, otic vitamins,  Coumadin as directed, verapamil 180 mg p.o. b.i.d., and Zegerid.   PHYSICAL EXAMINATION:  VITAL SIGNS:  Her physical exam today shows a  blood pressure of 139/67, pulse 68.  She weighs 150 pounds.  HEENT:  Normal.  NECK:  Supple.  CHEST:  Clear.  CARDIOVASCULAR:  Regular rate and rhythm.  ABDOMEN:  Benign.  EXTREMITIES:  No edema.   Electrocardiogram shows a sinus rhythm at a rate of 69.  The axis is  normal.  There are no significant ST changes.   DIAGNOSES:  1. Paroxysmal atrial fibrillation:  The patient is converted on her      own to sinus rhythm.  She does not want to proceed with an anti-      arrhythmic at this point.  We will therefore continue with her      verapamil at a higher dose, (180 mg p.o. b.i.d.).  She will also      continue on Coumadin with a goal INR of 2-3 (she is age greater      than 49, and we discussed the embolic risk factors).  I will      discontinue her aspirin.  If she has recurrent atrial fibrillation      in the future that is symptomatic, then we will consider flecainide      at that point.  2. History of supraventricular tachycardia:  If  this recurs, we could      consider ablation by Dr. Ladona Ridgel in the future.  3. Hypertension:  The verapamil should continue to take care of her      blood pressure and is well controlled today.   I will see her back in four months.     Madolyn Frieze Jens Som, MD, [facility]  Electronically Signed   BSC/MedQ  DD: 09/29/2006  DT: 09/30/2006  Job #: [id number]

## 2010-08-27 NOTE — Procedures (Signed)
[facility]                              EXERCISE TREADMILL   NAME:[name], [name]                  MRN:          [id number]  DATE:07/09/2007                            DOB:          1932/12/30    This is an exercise treadmill after the initiation of flecainide for  atrial fibrillation to exclude exercise-induced ventricular tachycardia.  The patient exercised for 1 minute and 35 seconds on the Bruce protocol.  That was the equivalent 3.8 METS.  Her heart rate at resting was 78 and  increased to 113, which was 76% of her predicted maximum.  Her blood  pressure at rest was 152/89 and increased to 197/75.  There was no chest  pain during the study.  There were no ST changes.  There are no  arrhythmias noted.  The study was terminated secondary to fatigue.   FINAL INTERPRETATION:  Exercise treadmill with no chest pain and no  electrographic changes.  There were no exercise-induced ventricular  arrhythmias.  There was poor exercise tolerance.  Note the patient was  told to return in this office for a trough flecainide level.  She will  follow up otherwise as previously arranged.     Madolyn Frieze Jens Som, MD, [facility]  Electronically Signed    BSC/MedQ  DD: 07/09/2007  DT: 07/10/2007  Job #: [id number]

## 2010-08-27 NOTE — Assessment & Plan Note (Signed)
[facility]                            CARDIOLOGY OFFICE NOTE   CHANNING, SAVICH                  MRN:          [id number]  DATE:09/22/2007                            DOB:          1933/01/22    Ms. Ryland is a pleasant female who has a history of paroxysmal atrial  fibrillation and SVT.  Her LV function has been preserved.  I last saw  her on May 26, 2007, and at that time she had recurrent atrial  fibrillation.  We placed the patient on flecainide and ultimately she  underwent successful cardioversion.  She had a followup exercise  treadmill to exclude exercise-induced arrhythmias on July 09, 2007, and  it was negative.  She also had a trough flecainide level that was normal  at 0.68.  Since that time, she has dyspnea with more extreme activities  but no orthopnea, PND, pedal edema, palpitations, presyncope, syncope,  or chest pain.   MEDICATIONS:  1. Calcium.  2. Fish oil.  3. Coumadin as directed.  4. Verapamil 180 mg p.o. b.i.d.  5. Prilosec.  6. Zantac.  7. Multivitamin.  8. Vitamin C.  9. Magnivision.  10.Prevision.  11.Fosamax.  12.Flecainide 100 mg p.o. b.i.d.   PHYSICAL EXAM:  Blood pressure of 145/68 and the pulse is 66.  Weight is  153 pounds.  HEENT:  Normal.  NECK:  Supple.  CHEST:  Clear.  CARDIOVASCULAR:  Regular rate and rhythm.  ABDOMEN:  No tenderness.  SKIN:  No edema.   Electrocardiogram shows a sinus rhythm at a rate of 64.  There is a  first-degree AV block.   DIAGNOSES:  1. Paroxysmal atrial fibrillation - Ms. Thornley remains in sinus      rhythm today.  We will continue her flecainide 100 mg p.o. b.i.d.      as well as verapamil in case her atrial fibrillation recurs.  She      will continue on Coumadin with a goal INR of 2-3.  2. History of supraventricular tachycardia - She has had no further      episodes of this.  3. Coumadin therapy - This is being monitored in the Coumadin Clinic.  4. Hypertension - Blood pressure is reasonably well controlled on the      present medications.   I will see her back in approximately 6 months.     Madolyn Frieze Jens Som, MD, [facility]  Electronically Signed    BSC/MedQ  DD: 09/22/2007  DT: 09/22/2007  Job #: [id number]   cc:   Reuben Likes, M.D.

## 2010-08-27 NOTE — Assessment & Plan Note (Signed)
[facility]   NAME:  Sylvia [name], Sylvia [name]              ACCOUNT NO.:  [id number]   MEDICAL RECORD NO.:  [id number]      DATE OF BIRTH:  1932/07/14   PHYSICIAN:  Madolyn Frieze. Jens Som, MD, [facility] VISIT DATE:  06/29/2007                                   OFFICE VISIT   This is cardioversion of atrial fibrillation.  The patient was sedated  with penthothal 150 mg intravenously total by anesthesia.  Synchronized  cardioversion with 120 joules (biphasic) resulted in atrial  fibrillation.  A second attempt with 150 joules resulted in atrial  fibrillation as well.  A third attempt with 200 joules resulted in  normal sinus rhythm.  There were no immediate complications.  We would  recommend continuing the flecainide and the Coumadin.      Madolyn Frieze Jens Som, MD, [facility]  Electronically Signed     BSC/MEDQ  D:  06/29/2007  T:  06/29/2007  Job:  [id number]

## 2010-08-27 NOTE — Assessment & Plan Note (Signed)
[facility]                            CARDIOLOGY OFFICE NOTE   GEARLDENE, FIORENZA                     MRN:          [id number]  DATE:05/26/2007                            DOB:          11-10-1932    Mrs. Sylvia [name] is a very pleasant female who has a history of paroxysmal  atrial fibrillation and SVT.  Note, she did have a previous  catheterization in December 2003 that showed nonobstructive coronary  disease.  Her most recent echocardiogram was performed on July 21, 2006.  LV function was normal.  There is mild aortic insufficiency and mitral  regurgitation.  There is mild left atrial enlargement.  __________  mildly enlarged.  Since I last saw her, she does have some fatigue.  She  also has some dyspnea on exertion but there is no orthopnea, PND or  pedal edema.  She occasionally feels sore in her chest, but there is no  exertional chest pain.  She also has had palpitations.  She also has had  a URI.   CURRENT MEDICATIONS:  1. Evista 60 mg daily.  2. Calcium.  3. Fish oil.  4. Coumadin.  5. Verapamil 180 mg p.o. b.i.d.  6. Prilosec.  7. Zantac.  8. Vitamin and vitamin C.  9. She also takes magnesium.  10.Provision.  11.Mucous relief.  12.Albuterol.   PHYSICAL EXAMINATION:  Shows a blood pressure of 141/75 and pulses 87.  Her HEENT:  Normal.  NECK: Supple.  CHEST:  Clear.  CARDIOVASCULAR: Reveals an irregular rhythm.  ABDOMEN: Shows no tenderness.  EXTREMITIES:  Show no edema.   Electrocardiogram shows atrial fibrillation at a rate of 96.  The axis  is normal.  There are nonspecific ST changes.   DIAGNOSES:  1. Recurrent atrial fibrillation - Mrs. Cordial has converted back to      atrial fibrillation.  She is also complaining of fatigue and      shortness of breath.  This may be related to her recurrent atrial      fibrillation, although she has had some of this in sinus rhythm in      the past.  Her rate is 96.  I will increase  her verapamil to 240 mg      p.o. b.i.d.  She will continue on Coumadin with goal INR 2-3.  Once      we have documented that she has been 2 or higher with her INR for      three consecutive weeks, then we will begin flecainide 100 mg p.o.      b.i.d. She was will then be cardioverted 3 days later if she does      not convert on her own.  I will also check a trough flecainide      level on the third day.  She will then need an exercise treadmill      to exclude exercise-induced arrhythmia.  Hopefully, if she can      maintain sinus rhythm this will make her feel better long term.  2. History of SVT- We will follow this expectantly.  3. Hypertension -  Her blood pressure has been mildly elevated, but we      are increasing her verapamil.  4. Coumadin therapy - This is being monitored our Coumadin Clinic.   We will obtain her most recent laboratories from Dr. Lorenz Coaster including  her TSH.  I will see her back in 4 weeks.     Madolyn Frieze Jens Som, MD, [facility]  Electronically Signed    BSC/MedQ  DD: 05/26/2007  DT: 05/27/2007  Job #: [id number]   cc:   Reuben Likes, M.D.

## 2010-08-27 NOTE — Assessment & Plan Note (Signed)
[facility]                            CARDIOLOGY OFFICE NOTE   Sylvia [name], Sylvia [name]                     MRN:          [id number]  DATE:08/24/2006                            DOB:          April 15, 1932    Sylvia [name] returns for followup today.  We recently brought her to  [facility] and proceeded with cardioversion of her atrial  fibrillation to sinus rhythm.  Since then, she states her palpitations  have improved, but she still has these.  She also states her dyspnea has  improved, but she still has dyspnea on exertion.  There is no orthopnea  or PND, but there can be mild pedal edema.  She has not had chest pain  or syncope.   MEDICATIONS:  1. Evista 60 mg p.o. daily.  2. Calcium 1 p.o. daily.  3. Aspirin 81 mg p.o. daily.  4. Multivitamin daily.  5. Fish oil.  6. Verapamil 240 mg p.o. daily.  7. Eye drops.  8. Prilosec.  9. Coumadin as directed.   PHYSICAL EXAMINATION:  VITAL SIGNS:  Blood pressure of 144/80, and her  pulse is 84.  She weighs 153 pounds.  NECK:  Supple.  CHEST:  Clear.  CARDIOVASCULAR:  Reveals an irregular rhythm.  EXTREMITIES:  Show no edema.   Her electrocardiogram shows atrial fibrillation at a rate of 92.  The  axis is normal.  There are nonspecific ST changes.   DIAGNOSES:  1. Recurrent atrial fibrillation.  The patient continues to state that      she feels bad with this rhythm.  She has complained of palpitations      and mild dyspnea as well as fatigue.  I therefore think we need to      reestablish sinus rhythm for symptomatic relief.  We did check her      INR today, and her level was subtherapeutic at 1.5.  We had long      discussions today concerning rate control versus rhythm control and      have elected to proceed with rhythm control.  This is particularly      in light of her symptoms.  We will schedule her for a TEE      cardioversion once her INR has been therapeutic.  We will then  initiate flecainide at 50 mg p.o. b.i.d. to help maintain sinus      rhythm.  Once we initiate the flecainide, then she will need a      stress Myoview both to exclude ischemia and also to exclude      exercise-induced ventricular arrhythmia.  Note she did have a      cardiac catheterization in 2003 that showed a 20% first obtuse      marginal, but no other coronary disease was noted.  I therefore      think flecainide is an appropriate choice.  She will continue on      her Coumadin afterward, with a goal INR of 2-3.  2. History of supraventricular tachycardia.  If this becomes more      frequent in the  future, then we could consider referral back to Dr.      Ladona Ridgel for consideration of ablation.  3. Hypertension.  We will increase her verapamil to 360 mg p.o. daily      both for her blood pressure and for rate control.  We will see her      back in 4-6 weeks.     Madolyn Frieze Jens Som, MD, [facility]  Electronically Signed    BSC/MedQ  DD: 08/24/2006  DT: 08/25/2006  Job #: [id number]

## 2010-08-27 NOTE — Assessment & Plan Note (Signed)
[facility]                            CARDIOLOGY OFFICE NOTE   Sylvia [name], Sylvia [name]                     MRN:          [id number]  DATE:02/04/2007                            DOB:          04/27/1932    Sylvia [name] is a pleasant female who has a history of paroxysmal  atrial fibrillation and SVT. Since I last saw her, she does have some  fatigue. She also has some dyspnea on exertion. There is no orthopnea,  PND, pedal edema or syncope. There is no chest pain. She has felt her  heart skip occasionally, but nothing sustained.   CURRENT MEDICATIONS:  1. Evista 60 mg p.o. daily.  2. Calcium.  3. Multivitamin.  4. Fish oil.  5. Optic vitamin.  6. Coumadin as directed.  7. Verapamil 180 mg p.o. b.i.d.  8. Prilosec.  9. Zantac 150 mg two p.o. q nightly.   PHYSICAL EXAMINATION:  Shows a blood pressure of 132/72, pulse 74. She  weighs 151 pounds.  HEENT: Is normal.  NECK: Supple.  CHEST: Is clear.  CARDIOVASCULAR: regular rate and rhythm.  ABDOMEN: Benign.  EXTREMITIES: Show no edema.   Her electrocardiogram shows a sinus rhythm at a rate of 74. There are no  ST changes noted.   DIAGNOSES:  1. History of paroxysmal atrial fibrillation. The patient remains in      sinus rhythm today.  She is complaining of fatigue and some      shortness of breath and wonders whether this is related to the      verapamil. I think this is unlikely, but I did offer to change      medications if she wished. However, she has done well on the      verapamil and we have elected to continue with that at present. She      will continue on Coumadin with a goal of 2-3 given that she is      older than 75 years of age and has a history of hypertension. If      she has recurrent atrial fibrillation in the future then we could      consider an anti-arrhythmic such as flecainide.  2. History of SVT. She has not had any episodes and we could refer her      back to Dr.   Ladona Ridgel in the future if she would like to consider      ablation.  3. Coumadin therapy. This is being monitored in our Coumadin clinic. I      have outlined orders 2-3. I will check a CBC today given her      history of fatigue. Note, she did state that her TSH at Dr.      Melanee Spry office approximately six months ago was normal.  4. Hypertension. Her blood pressure is well-controlled on her present      medications.   I will see her back in six months.     Madolyn Frieze Jens Som, MD, [facility]  Electronically Signed    BSC/MedQ  DD: 02/04/2007  DT: 02/04/2007  Job #: [id number]  cc:   Reuben Likes, M.D.

## 2010-08-27 NOTE — Assessment & Plan Note (Signed)
[facility]                            CARDIOLOGY OFFICE NOTE   MEEAH, TOTINO                  MRN:          [id number]  DATE:04/18/2008                            DOB:          11-Nov-1932    [name] is a pleasant female who has a history of paroxysmal atrial  fibrillation and SVT.  Her LV function is normal.  Since I last saw her  on September 22, 2007, she has been relatively well.  She did state she had a  bout in November however short-lived, and otherwise she has had no  palpitations.  She denies any dyspnea, chest pain, or syncope, and there  is no pedal edema.  She did state that back in early December, she had  an episode of vomiting and it appeared to be black.  There is also  some dark stools.  She was seen by Dr. Lorenz Coaster and placed on Carafate.  He also apparently ordered an upper GI that showed reflux/esophagitis,  but I do not have those records available.  She has had no further  episodes since then.   MEDICATIONS AT PRESENT:  1. Calcium.  2. Fish oil.  3. Coumadin as directed.  4. Vitamin C.  5. Multivitamin.  6. __________ mucus relief.  7. Fosamax.  8. Flecainide 100 mg p.o. b.i.d.  9. Verapamil 120 mg p.o. b.i.d.  10.Zantac 150 mg p.o. b.i.d.  11.Prilosec 20 mg p.o. b.i.d.   PHYSICAL EXAMINATION:  VITAL SIGNS:  Blood pressure of 136/68 and her  pulse is 76.  She weighs 152 pounds.  HEENT:  Normal.  NECK:  Supple.  CHEST:  Clear.  CARDIOVASCULAR:  Regular rate and rhythm.  ABDOMEN:  No tenderness.  EXTREMITIES:  No edema.   Electrocardiogram shows sinus rhythm with occasional PVC.  There is a  first-degree AV block.  The axis is normal.  There are nonspecific ST  changes.   DIAGNOSES:  1. Paroxysmal atrial fibrillation - Sylvia [name] remains in sinus      rhythm today.  She will continue on her flecainide as well as her      verapamil.  She will also continue on Coumadin with goal INR 2-3.  2. History of  supraventricular tachycardia - she has had no further      episodes of this at present.  3. Coumadin therapy - this is being monitored up in Coumadin Clinic      with a goal INR of 2-3.  We will check a CBC today.  4. Hypertension - blood pressure is adequately controlled on her      present medications.  5. Recent question gastrointestinal bleed - we are checking CBC.  Dr.      Lorenz Coaster has evaluated the patient and apparently has ordered an      upper gastrointestinal that showed reflux.  I will leave this issue      to him.  We will see Sylvia [name] back in 6 months.     Madolyn Frieze Jens Som, MD, [facility]  Electronically Signed    BSC/MedQ  DD: 04/18/2008  DT: 04/18/2008  Job #:  [id number]   cc:   Reuben Likes, M.D.

## 2010-08-30 ENCOUNTER — Telehealth: Payer: Self-pay | Admitting: Pharmacist

## 2010-08-30 NOTE — Assessment & Plan Note (Signed)
[facility]                            CARDIOLOGY OFFICE NOTE   LORELEY, SCHWALL                     MRN:          [id number]  DATE:07/07/2006                            DOB:          [birth year]    Sylvia [name] returns for followup today.  She is a pleasant female who  has a history of supraventricular tachycardia.  She recently developed  pneumonia with a productive cough and was seen by Dr. Lorenz Coaster and placed  on antibiotics.  She was also noted to be in atrial fibrillation which  was a new diagnosis.  She saw Tereso Newcomer, PA-C, on June 24, 2006.  At  that time he scheduled her to have lab work.  She had normal BMET and  her TSH was normal at 2.15.  She was placed on Digoxin in addition to  her Verapamil and Coumadin was initiated.  Since then, she states her  pneumonia is slowly improving.  She does have some dyspnea but there is  no orthopnea, PND, pedal edema, palpitations, presyncope, syncope, or  chest pain.   MEDICATIONS:  Her medications include-  1. Evista 60 mg p.o. daily.  2. Calcium 1 p.o. daily.  3. Aspirin 81 mg p.o. daily.  4. Multivitamin.  5. Fish oil.  6. Verapamil 240 mg p.o. daily.  7. Coumadin.  8. Nexium 40 mg p.o. daily.  9. Carafate.  10.Zantac.  11.Digoxin 0.25 mg p.o. daily.   PHYSICAL EXAMINATION:  VITAL SIGNS:  Blood pressure 130/76.  Pulse 85.  She weighs 148 pounds.  NECK:  Supple.  CHEST:  Clear.  CARDIOVASCULAR EXAM:  Reveals an irregular rhythm.  EXTREMITIES:  Show no edema.  Electrocardiogram shows atrial fibrillation at a rate of 83.  There are  no significant ST changes noted.   DIAGNOSES:  1. New onset atrial fibrillation.  2. Recent pneumonia.  3. History of hypertension.  4. Gastroesophageal reflux disease.  5. History of supraventricular tachycardia.   PLAN:  Mrs. Foronda has developed atrial fibrillation.  I wonder if  this may have occurred due to the stress of her pneumonia.  Her  rate is  controlled on the Verapamil and the Digoxin and she is now on Coumadin.  We will plan to wait for her INR to be therapeutic for three straight  weeks and then we will plan to proceed with an outpatient cardioversion.  NOTE:  Her TSH was normal.  We will plan to repeat her echocardiogram to  reassess her LV function.  Hopefully her atrial fibrillation will not  recur if this indeed was related to her pneumonia.  We will schedule her  back in six weeks.     Madolyn Frieze Jens Som, MD, [facility]  Electronically Signed    BSC/MedQ  DD: 07/07/2006  DT: 07/07/2006  Job #: [id number]   cc:   Reuben Likes, M.D.

## 2010-08-30 NOTE — Op Note (Signed)
NAME:  Sylvia [name], FAIT                        ACCOUNT NO.:  [id number]   MEDICAL RECORD NO.:  [id number]                   PATIENT TYPE:  AMB   LOCATION:  ENDO                                 FACILITY:   PHYSICIAN:  Anselmo Rod, M.D.               DATE OF BIRTH:  Jan 09, 1933   DATE OF PROCEDURE:  07/06/2002  DATE OF DISCHARGE:                                 OPERATIVE REPORT   PROCEDURE:  Esophagogastroduodenoscopy with biopsies.   ENDOSCOPIST:  Charna Elizabeth, M.D.   INSTRUMENT USED:  Olympus video panendoscope.   INDICATIONS FOR PROCEDURE:  This is a 77 year old white female with history  of epigastric pain not responding to PPIs; rule out peptic ulcer disease,  esophagitis, gastritis, etc.   PREPROCEDURE PREPARATION:  Informed consent was procured from the patient.  The patient fasted for eight hours prior to the procedure.   PREPROCEDURE PHYSICAL:  VITAL SIGNS:  The patient had stable vital signs.  NECK:  Supple.  CHEST:  Clear to auscultation.  CARDIAC:  S1 and S2 regular.  ABDOMEN:  Soft with normal bowel sounds.   DESCRIPTION OF PROCEDURE:  The patient was placed in the left lateral  decubitus position and sedated with 40 mg of Demerol and 4 mg of Versed  intravenously.  Once the patient was adequately sedated, maintained on low-  flow oxygen and continuous cardiac monitoring, the Olympus video  panendoscope was advanced through the mouth piece, over the tongue, into the  esophagus under direct vision.  The entire esophagus appeared normal with no  evidence of rings, stricture, masses, esophagitis or Barrett's mucosa.   The scope was then advanced into the stomach.  Multiple small sessile polyps  were seen in the proximal portion of the stomach.  These were biopsied for  pathology.  There was evidence of linear erosions and gastritis in the mid  body of the stomach.  This was biopsied as well to rule out H. pylori.  The  antrum and the proximal small bowel  including the duodenum appeared normal.  There was a small hiatal hernia seen on high retroflexion.   IMPRESSION:  1. Normal appearing esophagus and proximal small bowel.  2. Small hiatal hernia seen on retroflexion.  3. Multiple small sessile polyps and linear erosions biopsied from the     proximal portion of the stomach and mid body.   RECOMMENDATIONS:  1. Await pathology results.  2. Proceed with a colonoscopy at this time.  3.     Continue PPIs for now.  4. Treat with antibiotics if H. pylori is present by biopsies.  5. Proceed with a colonoscopy at this time.  Further recommendations will be     made thereafter.  Anselmo Rod, M.D.    JNM/MEDQ  D:  07/06/2002  T:  07/06/2002  Job:  [id number]   cc:   Reuben Likes, M.D.  [address]  [place]  [place] [address]  Fax: [phone]

## 2010-08-30 NOTE — Op Note (Signed)
   NAME:  Sylvia [name], Sylvia [name]                        ACCOUNT NO.:  [id number]   MEDICAL RECORD NO.:  [id number]                   PATIENT TYPE:  AMB   LOCATION:  ENDO                                 FACILITY:  [facility]   PHYSICIAN:  Anselmo Rod, M.D.               DATE OF BIRTH:  Aug 23, 1932   DATE OF PROCEDURE:  07/06/2002  DATE OF DISCHARGE:                                 OPERATIVE REPORT   PROCEDURE:  Screening colonoscopy.   ENDOSCOPIST:  Anselmo Rod, M.D.   INSTRUMENT USED:  Pediatric adjustable Olympus colonoscope.   INDICATION FOR PROCEDURE:  A 77 year old white female undergoing screening  colonoscopy.  Rule out colonic polyps, masses, etc.   PREPROCEDURE PREPARATION:  Informed consent was procured from the patient.  The patient fasted for eight hours prior to the procedure and prepped with a  bottle of Miralax and Gatorade the night prior to the procedure.   PREPROCEDURE PHYSICAL:  VITAL SIGNS:  The patient had stable vital signs.  NECK:  Supple.  CHEST:  Clear to auscultation.  S1, S2 regular.  ABDOMEN:  Soft with normal bowel sounds.   DESCRIPTION OF PROCEDURE:  The patient was placed in the left lateral  decubitus position and sedated with an additional 20 mg of Demerol and 2 mg  of Versed intravenously.  Once the patient was adequately sedate and  maintained on low-flow oxygen and continuous cardiac monitoring, the Olympus  video colonoscope was advanced from the rectum to the cecum with slight  difficulty.  The patient had a very atonic colon.  No masses, polyps,  erosions, ulcerations, or diverticula were seen.  Small internal hemorrhoids  were seen on retroflexion in the rectum.   IMPRESSION:  1. Essentially unrevealing colonoscopy except for small, nonbleeding     internal hemorrhoids seen on retroflexion.  2. No masses or polyps seen.   RECOMMENDATIONS:  1. Considering the patient's age, a repeat colorectal cancer screening is     recommended in the  next five to 10 years     unless the patient develops any abnormal symptoms in the interim.  2. A high-fiber diet.  3. Outpatient follow-up in the next two weeks for further recommendations.                                               Anselmo Rod, M.D.    JNM/MEDQ  D:  07/06/2002  T:  07/07/2002  Job:  [id number]   cc:   Reuben Likes, M.D.  [address]  [place]  [place] [address]  Fax: [phone]

## 2010-08-30 NOTE — Cardiovascular Report (Signed)
NAME:  Sylvia [name], Sylvia [name]                        ACCOUNT NO.:  [id number]   MEDICAL RECORD NO.:  [id number]                   PATIENT TYPE:  OIB   LOCATION:  2899                                 FACILITY:  [facility]   PHYSICIAN:  Learta Codding, M.D. [facility]             DATE OF BIRTH:  [birth year]   DATE OF PROCEDURE:  04/13/2002  DATE OF DISCHARGE:                              CARDIAC CATHETERIZATION   PROCEDURE PERFORMED:  1. Left heart catheterization with selective coronary angiography.  2. Ventriculography.   DIAGNOSES:  1. No significant flow-limiting epicardial coronary artery disease.  2. Normal left ventricular systolic function.  3. Normal left ventricular end diastolic pressure.   INDICATIONS FOR PROCEDURE:  The patient is a 77 year old female who was  referred with increased substernal chest pain.  The patient had a prior  negative Cardiolite study but now due to ongoing symptoms has been referred  to the catheterization lab for diagnostic catheterization to assess her  coronary anatomy.   DESCRIPTION OF PROCEDURE:  After informed consent was obtained, the patient  was brought to the catheterization laboratory.  The right groin was  sterilely prepped and draped, and a #6 [place] arterial sheath was placed  using the modified Seldinger technique.  The #6 [place] and JR4 catheters  were used to engage the left and right coronary ostia, respectively.  A #6  French angled pigtail catheter was used for ventriculography.  At the  termination of the procedure, all catheters and sheath were removed.  Adequate hemostasis provided.  No complications were encountered.   FINDINGS:  1. Hemodynamics     A. Left ventricular pressure 131/9 mmHg.     B. Aortic pressure 131/63 mmHg.  2. Ventriculography:  Ejection fraction 65%.  No segmental wall motion     abnormalities.  No mitral regurgitation.  3. Selective coronary angiography     A. The left main coronary artery was a large caliber  vessel with no        evidence of flow-limiting disease.     B. The left anterior descending artery was a large caliber vessel        wrapping around the apex.  The diagonal branches were free of flow-        limiting disease as well as the LAD proper.     C. There was a small ramus branch which was free of flow-limiting        disease.     D. The circumflex coronary artery was a large caliber vessel giving rise        to two obtuse marginal branches.  The first obtuse marginal branch had        a diffuse 20% narrowing.     E. The right coronary artery was a large caliber vessel giving rise to a        posterolateral branch and a posterior descending artery.  There was  no        flow-limiting disease within this distribution.    RECOMMENDATIONS:  No angiographic evidence of significant epicardial  coronary artery disease.  The patient's chest pain is unlikely cardiac in  origin.  She will be referred to Dr. Lorenz Coaster for further followup.                                               Learta Codding, M.D. [facility]    GED/MEDQ  D:  04/13/2002  T:  04/13/2002  Job:  [id number]   cc:   Reuben Likes, M.D.  [address]  [place]  [place] [address]  Fax: [phone]   Charlton Haws, M.D. [facility]   Olga Millers, M.D. [facility]

## 2010-08-30 NOTE — Assessment & Plan Note (Signed)
[facility]                            CARDIOLOGY OFFICE NOTE   DELIGHT, BICKLE                     MRN:          [id number]  DATE:06/24/2006                            DOB:          Oct 07, 1932    PRIMARY CARDIOLOGIST:  Dr. Ivor Messier, electrophysiologist Dr. Lewayne Bunting.   PRIMARY CARE PHYSICIAN:  Dr. Lorenz Coaster.   HISTORY OF PRESENT ILLNESS:  Miss Kirley is a very pleasant 77-year-  old female patient followed by Dr. Jens Som and Dr. Ladona Ridgel with a  history of supraventricular tachycardia and palpitations. She has been  controlled on verapamil therapy in the past. Over the last month she has  had increasing acid reflux symptoms. She saw Dr. Lorenz Coaster last week and  was noted to be in atrial fibrillation and was set up for an appointment  today. She notes over the last month that she has had tachy palpitations  especially at night prior to taking her verapamil. Since she saw Dr.  Lorenz Coaster last week, she has had no recurrent palpitations.  She notes that  no medication changes have been made. She denies any significant change  in her shortness of breath with exertion. This is chronic for her. She  denies orthopnea or paroxysmal nocturnal dyspnea. She has trace pedal  edema from time to time but there has been no change. She did have chest  discomfort with her acid reflux disease. She is now on triple therapy  and this is improving with that.   CURRENT MEDICATIONS:  1. Evista 60 mg daily.  2. Calcium.  3. Aspirin 81 mg daily.  4. Multivitamin.  5. Fish oil.  6. Verapamil 240 mg daily.  7. Optic vitamin.  8. Nexium 40 mg daily.  9. Carafate 4 times a day.  10.Zantac 300 mg a day.  11.Allegra p.r.n.   ALLERGIES:  SULFA AND PENICILLIN.   PHYSICAL EXAMINATION:  She is a well-nourished, well-developed female in  no acute distress. Blood pressure 160/64, pulse 101, weight 151. Repeat  blood pressure by me on the left 142/90, on the right  130/88.  HEENT: Unremarkable.  NECK: No JVD.  CARDIAC: S1, S2, regular rate and rhythm.  LUNGS: Clear to auscultation bilaterally.  ABDOMEN: Soft, nontender.  EXTREMITIES: Without edema. Calves were soft, nontender.  SKIN: Warm and dry.  NEUROLOGIC: She is alert and oriented x3. Cranial nerves II-XII grossly  intact.   Electrocardiogram reveals, course A-Fib with a heart rate of 99, no  ischemic changes.   IMPRESSION:  1. New onset atrial fibrillation with controlled ventricular rate.  2. History of hypertension.  3. History of nonobstructive coronary disease by catheterization      December 2003.      a.     First obtuse marginal 20% stenosis at that time.  4. Good left ventricular function with an EF of 55% to 65% by      echocardiogram April 2007.  5. Dyslipidemia.  6. Gastroesophageal reflux disease/hiatal hernia- followed by Dr.      Lorenz Coaster.   PLAN:  The patient presents to the office today for further evaluation  of new onset of atrial fibrillation. She had been followed by Dr.  Jens Som and Dr. Ladona Ridgel in the past for supraventricular tachycardia.  There was some consideration in the past about whether or not she should  undergo oblation of her SVT. At the last time she saw Dr. Ladona Ridgel it was  decided to continue medical therapy. She now has atrial fibrillation.  Her embolic risk factors include hypertension as well as age. She denies  any history of diabetes mellitus, stroke, or heart failure. She was  given a prescription for Coumadin by her primary care physician. She had  not filled that yet. I discussed the case further with Dr. Ladona Ridgel. He  agreed that she needed to be on Coumadin. I asked her to go ahead and  start that today. Her heart rate is well controlled on her  electrocardiogram today but I imagine her heart rate is probably  averaging somewhere in the low 100s. I discussed this further with Dr.  Ladona Ridgel who agreed that we should start digoxin today. She will  start on  0.125 mg a day. We will check a BMET, PT, and INR, and TSH level today.  We will get her set up in our Coumadin clinic for follow up in the next  5 to 7 days. I will bring her back in follow up with Dr. Jens Som in the  next 2 weeks. She will need weekly INRs in the event that we do proceed  with cardioversion.      Tereso Newcomer, PA-C  Electronically Signed      Doylene Canning. Ladona Ridgel, MD  Electronically Signed   SW/MedQ  DD: 06/24/2006  DT: 06/26/2006  Job #: [id number]   cc:   Reuben Likes, M.D.

## 2010-08-30 NOTE — Telephone Encounter (Signed)
Spoke with pt.  Surgery has been postponed.  She is having problems with her eye and is needing injections.  Is going to wait until everything is better with her eyes before having surgery.  She will let us know when it is rescheduled.

## 2010-08-30 NOTE — Assessment & Plan Note (Signed)
[facility]                              CARDIOLOGY OFFICE NOTE   HARVEST, DEIST                     MRN:          [id number]  DATE:02/16/2006                            DOB:          04/03/1933    Sylvia [name] returns for followup today.  She has a history of SVT,  possibly secondary to paroxysmal atrial tachycardia.  She was recently  having worsening palpitations and we sent her to see Dr. Ladona Ridgel.  Note, she  did not tolerate Toprol due to severe fatigue.  He did place the patient on  Verapamil and suggested an event monitor if her symptoms continue.  Since  starting the verapamil, her palpitations have improved.  She, at present,  denies any dyspnea or chest pain and only occasionally feels a skip but  nothing sustained.   Her medications include:  1. Evista 60 mg p.o. daily.  2. Calcium.  3. Aspirin 81 mg p.o. daily.  4. Multivitamin.  5. Prilosec 20 mg p.o. nightly.  6. Fish oil.  7. Verapamil 240 mg p.o. daily.   Her physical exam today shows a blood pressure of 135/59 and her pulse is  73.  She weighs 149 pounds.  NECK:  Supple.  CHEST:  Clear.  CARDIOVASCULAR:  Regular rate and rhythm.  EXTREMITIES:  No edema.   Electrocardiogram shows a sinus rhythm at a rate of 66.  There are no ST  changes noted.   DIAGNOSES:  1. Supraventricular tachycardia.  2. Hypertension.  3. History of hyperlipidemia.  4. Gastroesophageal reflux disease.   PLAN:  Sylvia [name] is much improved on the verapamil.  We will continue  with those medications.  If she has worsening palpitations in the future,  then I have instructed her to either come to the office or to the emergency  room for an electrocardiogram.  If we are not able to  catch the rhythm that way, then we will try a CardioNet monitor.  If she has  worsening SVT in the future, then we could consider ablation.  I will see  her back in 6 months.     ______________________________  Madolyn Frieze. Jens Som, MD, [facility]    BSC/MedQ  DD: 02/16/2006  DT: 02/16/2006  Job #: [id number]   cc:   Reuben Likes, M.D.

## 2010-08-30 NOTE — Assessment & Plan Note (Signed)
[place] [facility]                           ELECTROPHYSIOLOGY OFFICE NOTE   Sylvia [name], Sylvia [name]                     MRN:          [id number]  DATE:11/05/2005                            DOB:          [birth year]    Sylvia [name] returns today for followup.  She is a very pleasant woman with  a history of palpitations and possible SVT who I saw back in May.  At that  time, she was having trouble with beta blockers and having frequent  palpitations.  There were no real sustained prolonged episodes of SVT.  The  patient was tried on Verapamil 240 mg a day, and she returns today for  followup.  She states that for about six weeks after the initiation of  Verapamil, she still had palpitations, though not quite severe, but over the  last two to three weeks, she has had no more symptomatic palpitations.  She  does note some constipation on the Verapamil but denied much in the way of  peripheral edema.   PHYSICAL EXAMINATION:  GENERAL:  She is a pleasant, well-appearing woman in  no distress.  VITAL SIGNS:  Blood pressure 124/59.  The pulse was 62 and regular.  Respirations were 18.  The weight was 151 pounds.  NECK:  Revealed no jugular venous distention.  LUNGS:  Clear bilaterally to auscultation.  CARDIOVASCULAR:  Regular rate and rhythm with normal S1 and S2.  EXTREMITIES:  Demonstrate no edema.   EKG demonstrates sinus rhythm.   IMPRESSION:  1.  Symptomatic palpitations.  2.  Chronic Verapamil therapy.  3.  Constipation on Verapamil   DISCUSSION:  I have discussed the treatment options with Sylvia [name] in  detail.  Her palpations are very quiet at the present time.  While she does  have constipation on Verapamil, she is tolerating this medicine reasonably  well and for this reason will continue her on Verapamil.  Will plan to see  her back in the office on a p.r.n. basis.                                   Doylene Canning. Ladona Ridgel, MD   GWT/MedQ  DD:   11/05/2005  DT:  11/05/2005  Job #:  [id number]

## 2010-08-30 NOTE — Assessment & Plan Note (Signed)
[facility]                            CARDIOLOGY OFFICE NOTE   CHARITY, TESSIER                     MRN:          [id number]  DATE:08/03/2006                            DOB:          02-Dec-1932    [name] returns for followup today.  She has a history of SVT.  She  recently developed atrial fibrillation after a bout of pneumonia.  Her  TSH was normal.  We did repeat her echocardiogram on July 21, 2006.  Her  LV function was normal.  There was mild aortic insufficiency and mitral  regurgitation.  There was mild biatrial enlargement.  We have been  anticoagulating her in anticipation of cardioverting.  She does have  mild dyspnea on exertion but there is no orthopnea, PND, or chest pain.  She has mild pedal edema, predominantly in the evening.  She also has  palpitations and weakness at times.   MEDICATIONS:  1. Coumadin.  2. Evista 60 mg p.o. daily.  3. Calcium daily.  4. Aspirin 81 mg p.o. daily.  5. Multivitamin.  6. Fish oil.  7. Verapamil 240 mg p.o. daily.  8. Digoxin 0.125 mg p.o. daily.  9. Prilosec.   PHYSICAL EXAMINATION:  VITAL SIGNS:  Shows a blood pressure of 128/72,  and her pulse is 65.  NECK:  Supple.  CHEST:  Clear.  CARDIOVASCULAR:  Reveals an irregular rhythm.  EXTREMITIES:  Show no edema.   DIAGNOSES:  1. Atrial fibrillation.  [name] has been therapeutic for greater      than 3 weeks.  We will proceed with an outpatient cardioversion.      The risks and benefits have been discussed and she agrees to      proceed.  She will continue on her digoxin and verapamil, but I      will hold her verapamil the morning of the procedure.  She will      resume this afterwards for rate control if her atrial fibrillation      recurs.  She will need to continue on Coumadin for four weeks      immediately following the procedure.  2. Recent pneumonia.  I am postulating that her atrial fibrillation      was related to her  recent pneumonia and hopefully this will not      recur.  3. History of supraventricular tachycardia.  4. History of hypertension.  5. Gastroesophageal reflux disease.   We will see her back in approximately 4 weeks.     Madolyn Frieze Jens Som, MD, [facility]  Electronically Signed    BSC/MedQ  DD: 08/03/2006  DT: 08/03/2006  Job #: [id number]

## 2010-08-30 NOTE — Telephone Encounter (Signed)
Message copied by Weston Brass on Fri Aug 30, 2010  9:37 AM ------      Message from: Olga Millers      Created: Tue Aug 20, 2010  6:04 AM       Ok to hold coumadin prior to procedure and resume after      Olga Millers            ----- Message -----         From: Mariane Masters, PHARMD         Sent: 08/19/2010   3:31 PM           To: Lewayne Bunting, MD            Pt having orthopedic procedure by Dr. Cleophas Dunker on 6/14.  Needs clearance to hold Coumadin.  Please advise.  Thanks.

## 2010-09-13 ENCOUNTER — Telehealth: Payer: Self-pay | Admitting: Cardiology

## 2010-09-13 NOTE — Telephone Encounter (Signed)
Called patient and left message for her to return my call She can increase her Verapamil back to the original dose and I will discuss with Dr Johney Frame on Mon  If her HR gets to fast she can go to the ER

## 2010-09-13 NOTE — Telephone Encounter (Signed)
Per pt calling C/O out of rhthym heart rate 101.

## 2010-09-16 ENCOUNTER — Ambulatory Visit (INDEPENDENT_AMBULATORY_CARE_PROVIDER_SITE_OTHER): Payer: Medicare Other | Admitting: *Deleted

## 2010-09-16 DIAGNOSIS — I4891 Unspecified atrial fibrillation: Secondary | ICD-10-CM

## 2010-09-16 LAB — POCT INR: INR: 1.8

## 2010-09-16 NOTE — Telephone Encounter (Signed)
Dr Johney Frame agreed with plan

## 2010-10-07 ENCOUNTER — Ambulatory Visit (INDEPENDENT_AMBULATORY_CARE_PROVIDER_SITE_OTHER): Payer: Medicare Other | Admitting: *Deleted

## 2010-10-07 DIAGNOSIS — I4891 Unspecified atrial fibrillation: Secondary | ICD-10-CM

## 2010-10-07 LAB — POCT INR: INR: 2

## 2010-10-08 ENCOUNTER — Telehealth: Payer: Self-pay | Admitting: Internal Medicine

## 2010-10-08 NOTE — Telephone Encounter (Signed)
tried to call the patient at home but did not get an answer  Looking at schedule and patient has appointment tomorrow

## 2010-10-08 NOTE — Telephone Encounter (Signed)
Pt is out of rhythm and they want her seen asap

## 2010-10-09 ENCOUNTER — Ambulatory Visit: Payer: Medicare Other | Admitting: Internal Medicine

## 2010-10-17 ENCOUNTER — Ambulatory Visit (INDEPENDENT_AMBULATORY_CARE_PROVIDER_SITE_OTHER): Payer: Medicare Other | Admitting: Internal Medicine

## 2010-10-17 ENCOUNTER — Encounter: Payer: Self-pay | Admitting: Internal Medicine

## 2010-10-17 ENCOUNTER — Ambulatory Visit (INDEPENDENT_AMBULATORY_CARE_PROVIDER_SITE_OTHER): Payer: Medicare Other | Admitting: *Deleted

## 2010-10-17 VITALS — BP 135/75 | HR 119 | Resp 16 | Ht 62.0 in | Wt 154.0 lb

## 2010-10-17 DIAGNOSIS — I10 Essential (primary) hypertension: Secondary | ICD-10-CM

## 2010-10-17 DIAGNOSIS — I4891 Unspecified atrial fibrillation: Secondary | ICD-10-CM

## 2010-10-17 DIAGNOSIS — K219 Gastro-esophageal reflux disease without esophagitis: Secondary | ICD-10-CM

## 2010-10-17 LAB — POCT INR: INR: 2.7

## 2010-10-17 MED ORDER — VERAPAMIL HCL ER 180 MG PO TBCR: EXTENDED_RELEASE_TABLET | ORAL | Status: DC

## 2010-10-17 MED ORDER — FLECAINIDE ACETATE 50 MG PO TABS: 50.0000 mg | ORAL_TABLET | Freq: Two times a day (BID) | ORAL | Status: DC

## 2010-10-17 NOTE — Assessment & Plan Note (Signed)
Sylvia [name] has done very well since her ablation.  Unfortunately, she has now returned to afib.  She has symptoms of palpitations and fatigue. At this time, I will start flecainide 50mg  BID.  She has tolerated flecainide in the past.  I will also increase verapamil to 90mg  qam and 180mg  qpm.  She will continue weekly INR checks.  If she remains in afib upon return, we will proceed with cardioversion.

## 2010-10-17 NOTE — Assessment & Plan Note (Signed)
Stable No change required today  

## 2010-10-17 NOTE — Patient Instructions (Signed)
Your physician has recommended you make the following change in your medication:  1) Increase Verapamil to 1/2 tablet in the morning and a whole tablet in the evening. 2) Start Flecainide 50mg  one tablet twice daily.  Your physician recommends that you schedule a follow-up appointment : Monday 10/28/10 @ 8:45am with Dr. Johney Frame.  You need to have weekly coumadin checks until you see Dr. Johney Frame back.

## 2010-10-17 NOTE — Progress Notes (Signed)
The patient presents today for routine electrophysiology followup.  Since last being seen in our clinic, the patient reports doing reasonably well.  She reports that several weeks ago, she reverted to atrial fibrillation.  She reports occasional palpitations and worsening of her chronic fatigue, but otherwise appears to be tolerating afib reasonably well.  Today, she denies symptoms of chest pain, shortness of breath, orthopnea, PND, lower extremity edema, dizziness, presyncope, syncope, or neurologic sequela.  The patient feels that she is tolerating medications without difficulties and is otherwise without complaint today.   Past Medical History  Diagnosis Date  . Atrial fibrillation     ATRIAL FIBRILLATION S/P  PVI 6/11  . GERD (gastroesophageal reflux disease)   . Macular degeneration   . Hyperlipidemia   . Hypertension    Past Surgical History  Procedure Date  . Total abdominal hysterectomy   . Ablation of dysrhythmic focus 09/2009    s/p PVI by JA    Current Outpatient Prescriptions  Medication Sig Dispense Refill  . acetaminophen (TYLENOL) 100 MG/ML solution Take 10 mg/kg by mouth every 4 (four) hours as needed.        . Ascorbic Acid (VITAMIN C) 500 MG CAPS Take by mouth daily.       . Calcium Carb-Cholecalciferol (CALCIUM 1000 + D PO) Take by mouth. TWICE A DAY        . ferrous sulfate 325 (65 FE) MG tablet Take 325 mg by mouth daily with breakfast.        . fish oil-omega-3 fatty acids 1000 MG capsule 1 tab po bid      . fluocinonide (LIDEX) 0.05 % external solution 2 (two) times daily. 1-2 GTTS IN EARS TWICE WEEKLY AS NEEDED       . levothyroxine (SYNTHROID, LEVOTHROID) 75 MCG tablet Take 75 mcg by mouth daily.        . Multiple Vitamin (MULTIVITAMIN) tablet Take 1 tablet by mouth daily.        . pantoprazole (PROTONIX) 40 MG tablet Take 40 mg by mouth 2 (two) times daily.        . polyethylene glycol powder (GLYCOLAX/MIRALAX) powder Take 17 g by mouth daily. PRN        .  pravastatin (PRAVACHOL) 20 MG tablet Take 20 mg by mouth daily.        . raloxifene (EVISTA) 60 MG tablet Take 60 mg by mouth daily.        Marland Kitchen warfarin (COUMADIN) 5 MG tablet Take by mouth as directed.        Marland Kitchen DISCONTD: losartan (COZAAR) 100 MG tablet Take 100 mg by mouth daily.        Marland Kitchen DISCONTD: verapamil (CALAN-SR) 180 MG CR tablet Take 180 mg by mouth at bedtime.       . flecainide (TAMBOCOR) 50 MG tablet Take 1 tablet (50 mg total) by mouth 2 (two) times daily.  60 tablet  11  . verapamil (CALAN-SR) 180 MG CR tablet Take 1/2 tablet every morning and 1 tablet every evening.  45 tablet  11  . DISCONTD: flecainide (TAMBOCOR) 50 MG tablet Take 1 tablet (50 mg total) by mouth 2 (two) times daily.  60 tablet  11  . DISCONTD: gabapentin (NEURONTIN) 100 MG capsule Take 100 mg by mouth as directed.        Marland Kitchen DISCONTD: guaiFENesin (ROBITUSSIN) 100 MG/5ML liquid Take 200 mg by mouth 3 (three) times daily as needed.        Marland Kitchen  DISCONTD: verapamil (CALAN-SR) 180 MG CR tablet Take 1/2 tablet every morning and 1 tablet every evening.  45 tablet  11    Allergies  Allergen Reactions  . Metoclopramide Hcl     REACTION: nervous, climbs the wall  . Penicillins     REACTION: hives  . Sulfonamide Derivatives     REACTION: rash    History   Social History  . Marital Status: Married    Spouse Name: N/A    Number of Children: N/A  . Years of Education: N/A   Occupational History  . Not on file.   Social History Main Topics  . Smoking status: Never Smoker   . Smokeless tobacco: Not on file  . Alcohol Use: No  . Drug Use: No  . Sexually Active: Not on file   Other Topics Concern  . Not on file   Social History Narrative   Lives with spouse    Family History  Problem Relation Age of Onset  . Stroke Other   . Coronary artery disease Other     ROS-  All systems are reviewed and are negative except as outlined in the HPI above    Physical Exam: Filed Vitals:   10/17/10 1102  BP: 135/75   Pulse: 119  Resp: 16  Height: 5\' 2"  (1.575 m)  Weight: 154 lb (69.854 kg)    GEN- The patient is well appearing, alert and oriented x 3 today.   Head- normocephalic, atraumatic Eyes-  Sclera clear, conjunctiva pink Ears- hearing intact Oropharynx- clear Neck- supple, no JVP Lymph- no cervical lymphadenopathy Lungs- Clear to ausculation bilaterally, normal work of breathing Heart- irregular rate and rhythm, no murmurs, rubs or gallops, PMI not laterally displaced GI- soft, NT, ND, + BS Extremities- no clubbing, cyanosis, or edema MS- no significant deformity or atrophy Skin- no rash or lesion Psych- euthymic mood, full affect Neuro- strength and sensation are intact  ekg today reveals afib, V rate 100 bpm, nonspecific ST/T changes  Assessment and Plan:

## 2010-10-24 ENCOUNTER — Encounter: Payer: Self-pay | Admitting: Internal Medicine

## 2010-10-25 ENCOUNTER — Encounter (INDEPENDENT_AMBULATORY_CARE_PROVIDER_SITE_OTHER): Payer: Medicare Other | Admitting: *Deleted

## 2010-10-25 DIAGNOSIS — I4891 Unspecified atrial fibrillation: Secondary | ICD-10-CM

## 2010-10-25 LAB — POCT INR: INR: 2.5

## 2010-10-28 ENCOUNTER — Ambulatory Visit (INDEPENDENT_AMBULATORY_CARE_PROVIDER_SITE_OTHER): Payer: Medicare Other | Admitting: Internal Medicine

## 2010-10-28 ENCOUNTER — Encounter: Payer: Self-pay | Admitting: Internal Medicine

## 2010-10-28 VITALS — BP 128/80 | HR 96 | Ht 62.0 in | Wt 152.0 lb

## 2010-10-28 DIAGNOSIS — R5381 Other malaise: Secondary | ICD-10-CM

## 2010-10-28 DIAGNOSIS — I4891 Unspecified atrial fibrillation: Secondary | ICD-10-CM

## 2010-10-28 DIAGNOSIS — I10 Essential (primary) hypertension: Secondary | ICD-10-CM

## 2010-10-28 MED ORDER — FLECAINIDE ACETATE 100 MG PO TABS: ORAL_TABLET | ORAL | Status: DC

## 2010-10-28 NOTE — Patient Instructions (Addendum)
Your physician recommends that you schedule a follow-up appointment in: 6 weeks with Dr Johney Frame  Your physician has recommended you make the following change in your medication: increase your Flecainide to 100mg  twice daily  Come in on Thurs morning for INR and EKG  If still in afib will go over to hospital to have cardioversion with Dr Tenny Craw  Don't eat or drink anything after midnight the night before coming to office

## 2010-10-28 NOTE — Progress Notes (Signed)
The patient presents today for routine electrophysiology followup.  Since last being seen in our clinic, the patient reports doing reasonably well.  She remains in atrial fibrillation.  She reports occasional palpitations and worsening of her chronic fatigue, but otherwise appears to be tolerating afib reasonably well.  Today, she denies symptoms of chest pain, shortness of breath, orthopnea, PND, lower extremity edema, dizziness, presyncope, syncope, or neurologic sequela.  The patient feels that she is tolerating medications without difficulties and is otherwise without complaint today.   Past Medical History  Diagnosis Date  . Atrial fibrillation     ATRIAL FIBRILLATION S/P  PVI 6/11  . GERD (gastroesophageal reflux disease)   . Macular degeneration   . Hyperlipidemia   . Hypertension    Past Surgical History  Procedure Date  . Total abdominal hysterectomy   . Ablation of dysrhythmic focus 09/2009    s/p PVI by JA    Current Outpatient Prescriptions  Medication Sig Dispense Refill  . acetaminophen (TYLENOL) 100 MG/ML solution Take 10 mg/kg by mouth every 4 (four) hours as needed.        . Ascorbic Acid (VITAMIN C) 500 MG CAPS Take by mouth daily.       . Calcium Carb-Cholecalciferol (CALCIUM 1000 + D PO) Take by mouth. TWICE A DAY        . ferrous sulfate 325 (65 FE) MG tablet Take 325 mg by mouth daily with breakfast.        . fish oil-omega-3 fatty acids 1000 MG capsule 2 tab po bid      . flecainide (TAMBOCOR) 50 MG tablet Take 1 tablet (50 mg total) by mouth 2 (two) times daily.  60 tablet  11  . fluocinonide (LIDEX) 0.05 % external solution 2 (two) times daily. 1-2 GTTS IN EARS TWICE WEEKLY AS NEEDED       . levothyroxine (SYNTHROID, LEVOTHROID) 75 MCG tablet Take 75 mcg by mouth daily.        . Multiple Vitamin (MULTIVITAMIN) tablet Take 1 tablet by mouth daily.        . pantoprazole (PROTONIX) 40 MG tablet Take 40 mg by mouth 2 (two) times daily.        . polyethylene glycol  powder (GLYCOLAX/MIRALAX) powder Take 17 g by mouth daily. PRN        . pravastatin (PRAVACHOL) 20 MG tablet Take 20 mg by mouth daily.        . raloxifene (EVISTA) 60 MG tablet Take 60 mg by mouth daily.        . verapamil (CALAN-SR) 180 MG CR tablet Take 1/2 tablet every morning and 1 tablet every evening.  45 tablet  11  . warfarin (COUMADIN) 5 MG tablet Take by mouth as directed.          Allergies  Allergen Reactions  . Metoclopramide Hcl     REACTION: nervous, climbs the wall  . Penicillins     REACTION: hives  . Sulfonamide Derivatives     REACTION: rash    History   Social History  . Marital Status: Married    Spouse Name: N/A    Number of Children: N/A  . Years of Education: N/A   Occupational History  . Not on file.   Social History Main Topics  . Smoking status: Never Smoker   . Smokeless tobacco: Not on file  . Alcohol Use: No  . Drug Use: No  . Sexually Active: Not on file   Other  Topics Concern  . Not on file   Social History Narrative   Lives with spouse    Family History  Problem Relation Age of Onset  . Stroke Other   . Coronary artery disease Other     ROS-  All systems are reviewed and are negative except as outlined in the HPI above    Physical Exam: Filed Vitals:   10/28/10 0846  BP: 128/80  Pulse: 96  Height: 5\' 2"  (1.575 m)  Weight: 152 lb (68.947 kg)    GEN- The patient is well appearing, alert and oriented x 3 today.   Head- normocephalic, atraumatic Eyes-  Sclera clear, conjunctiva pink Ears- hearing intact Oropharynx- clear Neck- supple, no JVP Lymph- no cervical lymphadenopathy Lungs- Clear to ausculation bilaterally, normal work of breathing Heart- irregular rate and rhythm, no murmurs, rubs or gallops, PMI not laterally displaced GI- soft, NT, ND, + BS Extremities- no clubbing, cyanosis, or edema MS- no significant deformity or atrophy Skin- no rash or lesion Psych- euthymic mood, full affect Neuro- strength and  sensation are intact  ekg today reveals coarse afib, V rate 90 bpm, nonspecific ST/T changes  Assessment and Plan:

## 2010-10-28 NOTE — Assessment & Plan Note (Signed)
Likely exacerbated by afib Will proceed with cardioversion as above.

## 2010-10-28 NOTE — Assessment & Plan Note (Signed)
She remains in afib but is better rate controlled. I will increase flecainide to 100mg  BID  Continue coumadin. She will proceed to cardioversion later this week if she has not converted to sinus at that time.  Risks, benefits, and alternatives to increasing flecainide and proceeding with cardioversion were discussed at length with patient and her spouse who wish to proceed.

## 2010-10-28 NOTE — Assessment & Plan Note (Signed)
Stable No change required today  

## 2010-10-31 ENCOUNTER — Encounter: Payer: Self-pay | Admitting: *Deleted

## 2010-10-31 ENCOUNTER — Ambulatory Visit (INDEPENDENT_AMBULATORY_CARE_PROVIDER_SITE_OTHER): Payer: Medicare Other

## 2010-10-31 ENCOUNTER — Ambulatory Visit (INDEPENDENT_AMBULATORY_CARE_PROVIDER_SITE_OTHER): Payer: Medicare Other | Admitting: *Deleted

## 2010-10-31 ENCOUNTER — Other Ambulatory Visit (INDEPENDENT_AMBULATORY_CARE_PROVIDER_SITE_OTHER): Payer: Medicare Other | Admitting: *Deleted

## 2010-10-31 ENCOUNTER — Ambulatory Visit (HOSPITAL_COMMUNITY)
Admission: RE | Admit: 2010-10-31 | Discharge: 2010-10-31 | Disposition: A | Discharge status: Home / Self Care | Payer: Medicare Other | Source: Ambulatory Visit | Attending: Internal Medicine | Admitting: Internal Medicine

## 2010-10-31 VITALS — BP 124/78 | HR 96 | Wt 152.0 lb

## 2010-10-31 DIAGNOSIS — Z01810 Encounter for preprocedural cardiovascular examination: Secondary | ICD-10-CM | POA: Insufficient documentation

## 2010-10-31 DIAGNOSIS — I4891 Unspecified atrial fibrillation: Secondary | ICD-10-CM

## 2010-10-31 DIAGNOSIS — K219 Gastro-esophageal reflux disease without esophagitis: Secondary | ICD-10-CM | POA: Insufficient documentation

## 2010-10-31 DIAGNOSIS — I10 Essential (primary) hypertension: Secondary | ICD-10-CM | POA: Insufficient documentation

## 2010-10-31 LAB — CBC WITH DIFFERENTIAL/PLATELET
Basophils Absolute: 0 10*3/uL (ref 0.0–0.1)
Basophils Relative: 1 % (ref 0–1)
Eosinophils Absolute: 0.2 10*3/uL (ref 0.0–0.7)
Eosinophils Relative: 5 % (ref 0–5)
Lymphocytes Relative: 30 % (ref 12–46)
MCV: 90.3 fL (ref 78.0–100.0)
Platelets: 186 10*3/uL (ref 150–400)
RDW: 15.1 % (ref 11.5–15.5)
WBC: 4.1 10*3/uL (ref 4.0–10.5)

## 2010-10-31 LAB — BASIC METABOLIC PANEL WITH GFR
CO2: 29 mEq/L (ref 19–32)
Calcium: 9 mg/dL (ref 8.4–10.5)
Creat: 0.84 mg/dL (ref 0.50–1.10)

## 2010-10-31 NOTE — Patient Instructions (Signed)
Pt instructed to proceed with today's scheduled Cardioversion. Pt verbalizes understanding.

## 2010-10-31 NOTE — Progress Notes (Signed)
Pt here for Nurse Visit prior to Cardioversion. RN assessed manual b/p, weight and ekg. B/P 124/78 left arm while sitting. Weight 152 lbs. RN reviewed EKG: Atrial Fibrillation: HR 120-82. Wende Mott, PA reviewed EKG. Pt will proceed to [facility] for scheduled Cardioversion today. Pt verbalizes understanding.

## 2010-11-01 ENCOUNTER — Encounter: Payer: Medicare Other | Admitting: *Deleted

## 2010-11-04 ENCOUNTER — Encounter: Payer: Medicare Other | Admitting: *Deleted

## 2010-11-06 ENCOUNTER — Ambulatory Visit (INDEPENDENT_AMBULATORY_CARE_PROVIDER_SITE_OTHER): Payer: Medicare Other | Admitting: *Deleted

## 2010-11-06 DIAGNOSIS — I4891 Unspecified atrial fibrillation: Secondary | ICD-10-CM

## 2010-11-07 NOTE — Discharge Summary (Signed)
  NAME:  Sylvia [name], Sylvia [name] NO.:  [id number]  MEDICAL RECORD NO.:  [id number]  LOCATION:  [facility]                         FACILITY:  [facility]  PHYSICIAN:  Pricilla Riffle, MD, FACCDATE OF BIRTH:  25-Oct-1932  DATE OF ADMISSION:  10/31/2010 DATE OF DISCHARGE:                              DISCHARGE SUMMARY   IDENTIFICATION:  The patient is a 77 year old with atrial fibrillation, plan for cardioversion.  The patient was anesthetized by Anesthesia with 80 mg propofol IV with the pads in the AP position.  She was cardioverted with 200 joules synchronized biphasic energy, this was unsuccessful.  Again, she was cardioverted with 200 joules synchronized biphasic energy with the addition of chest compression during inspiration.  This led to successful cardioversion to sinus rhythm.  A 12-lead EKG pending. Procedure without complications.     Pricilla Riffle, MD, [facility]     PVR/MEDQ  D:  10/31/2010  T:  10/31/2010  Job:  [id number]  Electronically Signed by Dietrich Pates MD [facility] on 11/07/2010 11:52:58 PM

## 2010-11-14 ENCOUNTER — Telehealth: Payer: Self-pay | Admitting: Internal Medicine

## 2010-11-14 NOTE — Telephone Encounter (Signed)
Dr. Johney Frame aware of pt's condition. Because pt is not able to come in the office for an EKG today, MD recommends for pt to keep the appointment for August 20 th. Pt. To call the office in symptoms get worse. Patient verbalized understanding.

## 2010-11-14 NOTE — Telephone Encounter (Signed)
Patient had a Cardioversion on July 19 th, on July 28 th pt's heart went out of rhythm. Today HR is still out of rhythm rate 93 beats/minute. Pt. Was offered to come to the office for an EKG, she said she had  injections in both eyes, she feels weak and don't feel like getting up. She has an appointment on August 20 th Pt.  would like to know if she needs to be seen sooner. She would like for Dr. Johney Frame know and what he recommends.

## 2010-11-14 NOTE — Telephone Encounter (Signed)
Per pt call, pt had a cardioversion done on July 19th, on July 28th pt heart went out of rhythm. When pt took her blood pressure today her HR was 93. Pt wanted to inform Dr. Johney Frame that her heart was not out of rhythm. Pt has appt on August 20th and didn't know if Allred wanted to see her sooner. Please return pt call to advise/discuss.

## 2010-12-02 ENCOUNTER — Ambulatory Visit (INDEPENDENT_AMBULATORY_CARE_PROVIDER_SITE_OTHER): Payer: Medicare Other | Admitting: Internal Medicine

## 2010-12-02 ENCOUNTER — Ambulatory Visit (INDEPENDENT_AMBULATORY_CARE_PROVIDER_SITE_OTHER): Payer: Medicare Other | Admitting: *Deleted

## 2010-12-02 ENCOUNTER — Encounter: Payer: Self-pay | Admitting: Internal Medicine

## 2010-12-02 DIAGNOSIS — I4891 Unspecified atrial fibrillation: Secondary | ICD-10-CM

## 2010-12-02 DIAGNOSIS — R5381 Other malaise: Secondary | ICD-10-CM

## 2010-12-02 DIAGNOSIS — I10 Essential (primary) hypertension: Secondary | ICD-10-CM

## 2010-12-02 DIAGNOSIS — R5383 Other fatigue: Secondary | ICD-10-CM

## 2010-12-02 MED ORDER — AMIODARONE HCL 200 MG PO TABS: ORAL_TABLET | ORAL | Status: DC

## 2010-12-02 NOTE — Assessment & Plan Note (Signed)
She remains in afib but is better rate controlled. She has failed medical therapy with flecainide.  We will therefore stop flecainide today.  Continue coumadin.  Therapeutic strategies for afib including medicine and ablation were discussed in detail with the patient today. Risk, benefits, and alternatives to each stategy were also discussed in detail today.  At this time, she wishes to avoid ablation.  We will therefore retry amiodarone.  She failed medical therapy with amiodarone prior to her first ablation, but may have better results now that she has undergone one afib ablation procedure. If she develops recurrent afib, then we should strongly reconsider repeat ablation. Risks of amiodarone were discussed today and she wishes to initiate this medicine.  We will check LFTs and TFTs today.  She will need close INR checks on amiodarone. Amiodarone 400mg  BID x 2 weeks, then 200mg  BID.  Return to our office in 4 weeks.  She will require repeat cardioversion if she remains in afib.

## 2010-12-02 NOTE — Assessment & Plan Note (Signed)
Stable No change required today  

## 2010-12-02 NOTE — Patient Instructions (Signed)
Your physician recommends that you schedule a follow-up appointment in: 4 weeks with Dr Johney Frame  Your physician has recommended you make the following change in your medication: stop Flecainide in 3 days 12/06/10 start Amiodarone 400mg  twice daily(take 2-200mg  tablets twice daily) for 2 weeks then decrease to 200mg  twice daily Labs today

## 2010-12-02 NOTE — Assessment & Plan Note (Signed)
Likely due to afib  As above

## 2010-12-02 NOTE — Progress Notes (Signed)
The patient presents today for routine electrophysiology followup.  Since last being seen in our clinic, the patient reports doing reasonably well. She was cardioverted but unfortunately, returned to afib in 9 days.  She reports occasional palpitations and worsening of her chronic fatigue, but otherwise appears to be tolerating afib reasonably well.  Today, she denies symptoms of chest pain, shortness of breath, orthopnea, PND, lower extremity edema, dizziness, presyncope, syncope, or neurologic sequela.  The patient feels that she is tolerating medications without difficulties and is otherwise without complaint today.   Past Medical History  Diagnosis Date  . Atrial fibrillation     ATRIAL FIBRILLATION S/P  PVI 6/11  . GERD (gastroesophageal reflux disease)   . Macular degeneration   . Hyperlipidemia   . Hypertension    Past Surgical History  Procedure Date  . Total abdominal hysterectomy   . Ablation of dysrhythmic focus 09/2009    s/p PVI by JA    Current Outpatient Prescriptions  Medication Sig Dispense Refill  . acetaminophen (TYLENOL) 100 MG/ML solution Take 10 mg/kg by mouth every 4 (four) hours as needed.        . Ascorbic Acid (VITAMIN C) 500 MG CAPS Take by mouth daily.       . Calcium Carb-Cholecalciferol (CALCIUM 1000 + D PO) Take by mouth. TWICE A DAY       . ferrous sulfate 325 (65 FE) MG tablet Take 325 mg by mouth daily with breakfast.        . fish oil-omega-3 fatty acids 1000 MG capsule 2 tab po bid      . fluocinonide (LIDEX) 0.05 % external solution 2 (two) times daily. 1-2 GTTS IN EARS TWICE WEEKLY AS NEEDED       . levothyroxine (SYNTHROID, LEVOTHROID) 75 MCG tablet Take 75 mcg by mouth daily.        Marland Kitchen losartan (COZAAR) 100 MG tablet Take 100 mg by mouth daily.        . Multiple Vitamin (MULTIVITAMIN) tablet Take 1 tablet by mouth daily.        . pantoprazole (PROTONIX) 40 MG tablet Take 40 mg by mouth daily.       . polyethylene glycol powder (GLYCOLAX/MIRALAX)  powder Take 17 g by mouth daily. PRN        . pravastatin (PRAVACHOL) 20 MG tablet Take 20 mg by mouth daily.        . raloxifene (EVISTA) 60 MG tablet Take 60 mg by mouth daily.        . verapamil (CALAN-SR) 180 MG CR tablet Take 1/2 tablet every morning and 1 tablet every evening.  45 tablet  11  . warfarin (COUMADIN) 5 MG tablet Take by mouth as directed.        Marland Kitchen amiodarone (PACERONE) 200 MG tablet Take 2 tablets by mouth twice a day for 2 weeks then decrease to 1 tablet twice daily  56 tablet  3    Allergies  Allergen Reactions  . Metoclopramide Hcl     REACTION: nervous, climbs the wall  . Penicillins     REACTION: hives  . Sulfonamide Derivatives     REACTION: rash    History   Social History  . Marital Status: Married    Spouse Name: N/A    Number of Children: N/A  . Years of Education: N/A   Occupational History  . Not on file.   Social History Main Topics  . Smoking status: Never Smoker   .  Smokeless tobacco: Not on file  . Alcohol Use: No  . Drug Use: No  . Sexually Active: Not on file   Other Topics Concern  . Not on file   Social History Narrative   Lives with spouse    Family History  Problem Relation Age of Onset  . Stroke Other   . Coronary artery disease Other     ROS-  All systems are reviewed and are negative except as outlined in the HPI above    Physical Exam: Filed Vitals:   12/02/10 1212  BP: 148/80  Pulse: 85  Height: 5\' 2"  (1.575 m)  Weight: 156 lb 6.4 oz (70.943 kg)    GEN- The patient is well appearing, alert and oriented x 3 today.   Head- normocephalic, atraumatic Eyes-  Sclera clear, conjunctiva pink Ears- hearing intact Oropharynx- clear Neck- supple, no JVP Lymph- no cervical lymphadenopathy Lungs- Clear to ausculation bilaterally, normal work of breathing Heart- irregular rate and rhythm, no murmurs, rubs or gallops, PMI not laterally displaced GI- soft, NT, ND, + BS Extremities- no clubbing, cyanosis, or  edema MS- no significant deformity or atrophy Skin- no rash or lesion Psych- euthymic mood, full affect Neuro- strength and sensation are intact  ekg today reveals afib, V rate 85 bpm, nonspecific ST/T changes  Assessment and Plan:

## 2010-12-03 ENCOUNTER — Encounter: Payer: Self-pay | Admitting: *Deleted

## 2010-12-03 LAB — HEPATIC FUNCTION PANEL
Alkaline Phosphatase: 44 U/L (ref 39–117)
Bilirubin, Direct: 0.1 mg/dL (ref 0.0–0.3)
Total Bilirubin: 0.6 mg/dL (ref 0.3–1.2)
Total Protein: 6.4 g/dL (ref 6.0–8.3)

## 2010-12-04 ENCOUNTER — Encounter: Payer: Medicare Other | Admitting: *Deleted

## 2010-12-10 ENCOUNTER — Ambulatory Visit (INDEPENDENT_AMBULATORY_CARE_PROVIDER_SITE_OTHER): Payer: Medicare Other | Admitting: *Deleted

## 2010-12-10 DIAGNOSIS — I4891 Unspecified atrial fibrillation: Secondary | ICD-10-CM

## 2010-12-17 ENCOUNTER — Ambulatory Visit (INDEPENDENT_AMBULATORY_CARE_PROVIDER_SITE_OTHER): Payer: Medicare Other | Admitting: *Deleted

## 2010-12-17 DIAGNOSIS — I4891 Unspecified atrial fibrillation: Secondary | ICD-10-CM

## 2010-12-17 LAB — POCT INR: INR: 3.5

## 2010-12-26 ENCOUNTER — Ambulatory Visit (INDEPENDENT_AMBULATORY_CARE_PROVIDER_SITE_OTHER): Payer: Medicare Other | Admitting: *Deleted

## 2010-12-26 DIAGNOSIS — I4891 Unspecified atrial fibrillation: Secondary | ICD-10-CM

## 2010-12-26 LAB — POCT INR: INR: 3.5

## 2010-12-30 ENCOUNTER — Encounter: Payer: Medicare Other | Admitting: *Deleted

## 2011-01-03 ENCOUNTER — Ambulatory Visit (INDEPENDENT_AMBULATORY_CARE_PROVIDER_SITE_OTHER): Payer: Medicare Other | Admitting: *Deleted

## 2011-01-03 DIAGNOSIS — I4891 Unspecified atrial fibrillation: Secondary | ICD-10-CM

## 2011-01-03 LAB — POCT INR: INR: 2.8

## 2011-01-06 ENCOUNTER — Encounter: Payer: Medicare Other | Admitting: *Deleted

## 2011-01-06 ENCOUNTER — Ambulatory Visit (INDEPENDENT_AMBULATORY_CARE_PROVIDER_SITE_OTHER): Payer: Medicare Other | Admitting: Internal Medicine

## 2011-01-06 ENCOUNTER — Encounter: Payer: Self-pay | Admitting: *Deleted

## 2011-01-06 ENCOUNTER — Other Ambulatory Visit: Payer: Self-pay | Admitting: *Deleted

## 2011-01-06 ENCOUNTER — Ambulatory Visit (INDEPENDENT_AMBULATORY_CARE_PROVIDER_SITE_OTHER): Payer: Medicare Other | Admitting: *Deleted

## 2011-01-06 ENCOUNTER — Encounter: Payer: Self-pay | Admitting: Internal Medicine

## 2011-01-06 VITALS — BP 133/64 | HR 74 | Ht 62.0 in | Wt 157.4 lb

## 2011-01-06 DIAGNOSIS — I4891 Unspecified atrial fibrillation: Secondary | ICD-10-CM

## 2011-01-06 DIAGNOSIS — I10 Essential (primary) hypertension: Secondary | ICD-10-CM

## 2011-01-06 DIAGNOSIS — Z7901 Long term (current) use of anticoagulants: Secondary | ICD-10-CM

## 2011-01-06 LAB — CBC
HCT: 36.6
Hemoglobin: 12.5
MCHC: 34
MCV: 90.7
RDW: 14.8

## 2011-01-06 LAB — CBC WITH DIFFERENTIAL/PLATELET
Eosinophils Relative: 4.7 % (ref 0.0–5.0)
HCT: 38.5 % (ref 36.0–46.0)
Hemoglobin: 12.6 g/dL (ref 12.0–15.0)
Lymphs Abs: 1.1 10*3/uL (ref 0.7–4.0)
MCV: 94.4 fl (ref 78.0–100.0)
Monocytes Absolute: 0.4 10*3/uL (ref 0.1–1.0)
Monocytes Relative: 9.3 % (ref 3.0–12.0)
Neutro Abs: 2.3 10*3/uL (ref 1.4–7.7)
WBC: 3.9 10*3/uL — ABNORMAL LOW (ref 4.5–10.5)

## 2011-01-06 LAB — BASIC METABOLIC PANEL
CO2: 23
Chloride: 108
Glucose, Bld: 90
Potassium: 4.2
Sodium: 138

## 2011-01-06 LAB — POCT INR: INR: 2.2

## 2011-01-06 LAB — PROTIME-INR: INR: 3.1 — ABNORMAL HIGH

## 2011-01-06 NOTE — Patient Instructions (Addendum)
Your physician has recommended that you have a Cardioversion (DCCV). Electrical Cardioversion uses a jolt of electricity to your heart either through paddles or wired patches attached to your chest. This is a controlled, usually prescheduled, procedure. Defibrillation is done under light anesthesia in the hospital, and you usually go home the day of the procedure. This is done to get your heart back into a normal rhythm. You are not awake for the procedure. Please see the instruction sheet given to you today.  Your physician recommends that you schedule a follow-up appointment in: 5 weeks follow up with Dr Johney Frame

## 2011-01-07 ENCOUNTER — Ambulatory Visit (HOSPITAL_COMMUNITY)
Admission: RE | Admit: 2011-01-07 | Discharge: 2011-01-07 | Disposition: A | Discharge status: Home / Self Care | Payer: Medicare Other | Source: Ambulatory Visit | Attending: Cardiology | Admitting: Cardiology

## 2011-01-07 DIAGNOSIS — Z7901 Long term (current) use of anticoagulants: Secondary | ICD-10-CM | POA: Insufficient documentation

## 2011-01-07 DIAGNOSIS — I4891 Unspecified atrial fibrillation: Secondary | ICD-10-CM | POA: Insufficient documentation

## 2011-01-07 LAB — CBC
MCHC: 34.1 g/dL (ref 30.0–36.0)
MCV: 91.2 fL (ref 78.0–100.0)
Platelets: 156 10*3/uL (ref 150–400)
RDW: 15.5 % (ref 11.5–15.5)
WBC: 4 10*3/uL (ref 4.0–10.5)

## 2011-01-07 LAB — BASIC METABOLIC PANEL
Calcium: 8.9 mg/dL (ref 8.4–10.5)
Chloride: 107 mEq/L (ref 96–112)
Creatinine, Ser: 0.84 mg/dL (ref 0.50–1.10)
GFR calc non Af Amer: >60 mL/min (ref >60)
GFR: 58.66 mL/min — ABNORMAL LOW (ref >60.00)
Glucose, Bld: 104 mg/dL — ABNORMAL HIGH (ref 70–99)
Glucose, Bld: 82 mg/dL (ref 70–99)
Potassium: 4.1 mEq/L (ref 3.5–5.1)
Sodium: 142 mEq/L (ref 135–145)
Sodium: 142 mEq/L (ref 135–145)

## 2011-01-08 NOTE — Progress Notes (Signed)
The patient presents today for routine electrophysiology followup.  Since last being seen in our clinic, the patient reports doing reasonably well. She remains in afib despite amiodarone.  She reports occasional palpitations and worsening of her chronic fatigue, but otherwise appears to be tolerating afib reasonably well.  Today, she denies symptoms of chest pain, shortness of breath, orthopnea, PND, lower extremity edema, dizziness, presyncope, syncope, or neurologic sequela.  The patient feels that she is tolerating medications without difficulties and is otherwise without complaint today.   Past Medical History  Diagnosis Date  . Atrial fibrillation     ATRIAL FIBRILLATION S/P  PVI 6/11  . GERD (gastroesophageal reflux disease)   . Macular degeneration   . Hyperlipidemia   . Hypertension    Past Surgical History  Procedure Date  . Total abdominal hysterectomy   . Ablation of dysrhythmic focus 09/2009    s/p PVI by JA    Current Outpatient Prescriptions  Medication Sig Dispense Refill  . acetaminophen (TYLENOL) 100 MG/ML solution Take 10 mg/kg by mouth every 4 (four) hours as needed.        Marland Kitchen amiodarone (PACERONE) 200 MG tablet 1 daily ( patient could only tolerate one)       . calcium citrate-vitamin D (CITRACAL+D) 315-200 MG-UNIT per tablet Take 1 tablet by mouth daily.        . ferrous sulfate 325 (65 FE) MG tablet Take 325 mg by mouth daily with breakfast.        . fish oil-omega-3 fatty acids 1000 MG capsule 2 tab po bid      . fluocinonide (LIDEX) 0.05 % external solution 2 (two) times daily. 1-2 GTTS IN EARS TWICE WEEKLY AS NEEDED       . HYDROcodone-acetaminophen (VICODIN) 5-500 MG per tablet As directed      . levothyroxine (SYNTHROID, LEVOTHROID) 75 MCG tablet Take 75 mcg by mouth daily.        Marland Kitchen losartan (COZAAR) 100 MG tablet Take 100 mg by mouth daily.        . Multiple Vitamin (MULTIVITAMIN) tablet Take 1 tablet by mouth daily.        . pantoprazole (PROTONIX) 40 MG  tablet Take 40 mg by mouth 2 (two) times daily.       . polyethylene glycol powder (GLYCOLAX/MIRALAX) powder Take 17 g by mouth daily. PRN        . pravastatin (PRAVACHOL) 20 MG tablet Take 20 mg by mouth daily.        . raloxifene (EVISTA) 60 MG tablet Take 60 mg by mouth daily.        . verapamil (CALAN-SR) 180 MG CR tablet Take 1/2 tablet every morning and 1 tablet every evening.  45 tablet  11  . warfarin (COUMADIN) 5 MG tablet Take by mouth as directed.          Allergies  Allergen Reactions  . Metoclopramide Hcl     REACTION: nervous, climbs the wall  . Penicillins     REACTION: hives  . Sulfonamide Derivatives     REACTION: rash    History   Social History  . Marital Status: Married    Spouse Name: N/A    Number of Children: N/A  . Years of Education: N/A   Occupational History  . Not on file.   Social History Main Topics  . Smoking status: Never Smoker   . Smokeless tobacco: Not on file  . Alcohol Use: No  . Drug Use:  No  . Sexually Active: Not on file   Other Topics Concern  . Not on file   Social History Narrative   Lives with spouse    Family History  Problem Relation Age of Onset  . Stroke Other   . Coronary artery disease Other     ROS-  All systems are reviewed and are negative except as outlined in the HPI above    Physical Exam: Filed Vitals:   01/06/11 1204  BP: 133/64  Pulse: 74  Height: 5\' 2"  (1.575 m)  Weight: 157 lb 6.4 oz (71.396 kg)    GEN- The patient is well appearing, alert and oriented x 3 today.   Head- normocephalic, atraumatic Eyes-  Sclera clear, conjunctiva pink Ears- hearing intact Oropharynx- clear Neck- supple, no JVP Lymph- no cervical lymphadenopathy Lungs- Clear to ausculation bilaterally, normal work of breathing Heart- irregular rate and rhythm, no murmurs, rubs or gallops, PMI not laterally displaced GI- soft, NT, ND, + BS Extremities- no clubbing, cyanosis, or edema MS- no significant deformity or  atrophy Skin- no rash or lesion Psych- euthymic mood, full affect Neuro- strength and sensation are intact   Assessment and Plan:

## 2011-01-08 NOTE — Assessment & Plan Note (Signed)
Stable No change required today  

## 2011-01-08 NOTE — Assessment & Plan Note (Signed)
She remains in afib but is better rate controlled. She has failed medical therapy with flecainide.  She remains in afib on amiodarone  Continue coumadin.  Risks, benefits, and alternatives to repeat cardioversion were discussed with the patient today.  She wishes to proceed.  If she fails medical therapy with amiodarone, then we will consider rate control long term vs repeat catheter ablation.

## 2011-01-09 ENCOUNTER — Telehealth: Payer: Self-pay | Admitting: *Deleted

## 2011-01-09 NOTE — Telephone Encounter (Signed)
lmom for patient to call in regards to coming in and discussing repeat ablation Tried cell number but no voicemail avaliable

## 2011-01-09 NOTE — Telephone Encounter (Signed)
Dr Allred aware 

## 2011-01-09 NOTE — Telephone Encounter (Signed)
Pt called back and she doesn't want ablation

## 2011-01-09 NOTE — Progress Notes (Signed)
Done

## 2011-01-12 NOTE — Cardiovascular Report (Signed)
  NAME:  Sylvia [name], Sylvia [name] NO.:  [id number]  MEDICAL RECORD NO.:  [id number]  LOCATION:  [facility]                         FACILITY:  [facility]  PHYSICIAN:  Marca Ancona, MD      DATE OF BIRTH:  [birth year]  DATE OF PROCEDURE:  01/07/2011 DATE OF DISCHARGE:                           CARDIAC CATHETERIZATION   PRIMARY CARDIOLOGIST:  Hillis Range, MD  HISTORY OF PRESENT ILLNESS:  This is a 77 year old with a history of atrial fibrillation.  She has been therapeutically anticoagulated with Coumadin for over 4 weeks.  At this point, INR today is 2.3.  PROCEDURE NOTE:  After informed consent was obtained, the patient was sedated by Anesthesiology using IV propofol.  The patient received direct current cardioversion using 200 joules with a biphasic waveform. Initially, she did not convert to sinus rhythm.  I then tried again with sternal pressure at 200 joules, she did not convert to sinus rhythm.  I tried third time.  Once again with sternal pressure at 200 joules, she still did not convert to sinus rhythm.  No complications.  I will let Dr. Johney Frame know about the procedure and he will follow up with her.     Marca Ancona, MD     DM/MEDQ  D:  01/07/2011  T:  01/07/2011  Job:  [id number]  Electronically Signed by Marca Ancona MD on 01/12/2011 11:22:46 PM

## 2011-02-03 ENCOUNTER — Ambulatory Visit (INDEPENDENT_AMBULATORY_CARE_PROVIDER_SITE_OTHER): Payer: Medicare Other | Admitting: *Deleted

## 2011-02-03 DIAGNOSIS — Z7901 Long term (current) use of anticoagulants: Secondary | ICD-10-CM

## 2011-02-03 DIAGNOSIS — I4891 Unspecified atrial fibrillation: Secondary | ICD-10-CM

## 2011-02-03 LAB — POCT INR: INR: 1.5

## 2011-02-10 ENCOUNTER — Encounter: Payer: Self-pay | Admitting: Internal Medicine

## 2011-02-10 ENCOUNTER — Ambulatory Visit (INDEPENDENT_AMBULATORY_CARE_PROVIDER_SITE_OTHER): Payer: Medicare Other | Admitting: Internal Medicine

## 2011-02-10 VITALS — BP 154/89 | HR 104 | Ht 61.0 in | Wt 156.0 lb

## 2011-02-10 DIAGNOSIS — I4891 Unspecified atrial fibrillation: Secondary | ICD-10-CM

## 2011-02-10 DIAGNOSIS — I10 Essential (primary) hypertension: Secondary | ICD-10-CM

## 2011-02-10 MED ORDER — VERAPAMIL HCL 180 MG PO TBCR: EXTENDED_RELEASE_TABLET | ORAL | Status: DC

## 2011-02-10 NOTE — Patient Instructions (Signed)
Your physician recommends that you schedule a follow-up appointment in: 3 months with Dr Johney Frame  Your physician has recommended you make the following change in your medication:  1)Increase Verapamil to 180mg  twice daily

## 2011-02-10 NOTE — Assessment & Plan Note (Signed)
Long term persistent afib refractory to ablation and medical therapy Therapeutic strategies for afib including rate control vs repeat ablation were discussed in detail with the patient today.  At this time, she is clear that she would prefer rate control long term.  I will increase verapamil to 180mg  bid. Continue coumadin. No changes

## 2011-02-10 NOTE — Progress Notes (Signed)
The patient presents today for routine electrophysiology followup.  Since last being seen in our clinic, the patient reports doing reasonably well. She remains in afib.  She failed to achieve sinus with recent cardioversion despite amiodarone.  She reports occasional palpitations.  She feels that her fatigue has improved.  She appears to be tolerating afib reasonably well.  Today, she denies symptoms of chest pain, shortness of breath, orthopnea, PND, lower extremity edema, dizziness, presyncope, syncope, or neurologic sequela.  The patient feels that she is tolerating medications without difficulties and is otherwise without complaint today.   Past Medical History  Diagnosis Date  . Atrial fibrillation     ATRIAL FIBRILLATION S/P  PVI 6/11  . GERD (gastroesophageal reflux disease)   . Macular degeneration   . Hyperlipidemia   . Hypertension    Past Surgical History  Procedure Date  . Total abdominal hysterectomy   . Ablation of dysrhythmic focus 09/2009    s/p PVI by JA    Current Outpatient Prescriptions  Medication Sig Dispense Refill  . acetaminophen (TYLENOL) 100 MG/ML solution Take 10 mg/kg by mouth every 4 (four) hours as needed.        . calcium citrate-vitamin D (CITRACAL+D) 315-200 MG-UNIT per tablet Take 1 tablet by mouth daily.        . ferrous sulfate 325 (65 FE) MG tablet Take 325 mg by mouth daily with breakfast.        . fish oil-omega-3 fatty acids 1000 MG capsule 2 tab po bid      . fluocinonide (LIDEX) 0.05 % external solution 2 (two) times daily. 1-2 GTTS IN EARS TWICE WEEKLY AS NEEDED       . HYDROcodone-acetaminophen (VICODIN) 5-500 MG per tablet As directed      . levothyroxine (SYNTHROID, LEVOTHROID) 75 MCG tablet Take 75 mcg by mouth daily.        Marland Kitchen losartan (COZAAR) 100 MG tablet Take 100 mg by mouth daily.        . Multiple Vitamin (MULTIVITAMIN) tablet Take 1 tablet by mouth daily.        . pantoprazole (PROTONIX) 40 MG tablet Take 40 mg by mouth 2 (two) times  daily.       . polyethylene glycol powder (GLYCOLAX/MIRALAX) powder Take 17 g by mouth daily. PRN        . pravastatin (PRAVACHOL) 20 MG tablet Take 20 mg by mouth daily.        . raloxifene (EVISTA) 60 MG tablet Take 60 mg by mouth daily.        . verapamil (CALAN-SR) 180 MG CR tablet Take one tablet twice daily  60 tablet  11  . warfarin (COUMADIN) 5 MG tablet Take by mouth as directed.          Allergies  Allergen Reactions  . Metoclopramide Hcl     REACTION: nervous, climbs the wall  . Penicillins     REACTION: hives  . Sulfonamide Derivatives     REACTION: rash    History   Social History  . Marital Status: Married    Spouse Name: N/A    Number of Children: N/A  . Years of Education: N/A   Occupational History  . Not on file.   Social History Main Topics  . Smoking status: Never Smoker   . Smokeless tobacco: Not on file  . Alcohol Use: No  . Drug Use: No  . Sexually Active: Not on file   Other Topics Concern  .  Not on file   Social History Narrative   Lives with spouse    Family History  Problem Relation Age of Onset  . Stroke Other   . Coronary artery disease Other     ROS-  All systems are reviewed and are negative except as outlined in the HPI above    Physical Exam: Filed Vitals:   02/10/11 1121  BP: 154/89  Pulse: 104  Height: 5\' 1"  (1.549 m)  Weight: 156 lb (70.761 kg)    GEN- The patient is well appearing, alert and oriented x 3 today.   Head- normocephalic, atraumatic Eyes-  Sclera clear, conjunctiva pink Ears- hearing intact Oropharynx- clear Neck- supple, no JVP Lymph- no cervical lymphadenopathy Lungs- Clear to ausculation bilaterally, normal work of breathing Heart- irregular rate and rhythm, no murmurs, rubs or gallops, PMI not laterally displaced GI- soft, NT, ND, + BS Extremities- no clubbing, cyanosis, or edema MS- no significant deformity or atrophy Skin- no rash or lesion Psych- euthymic mood, full affect Neuro-  strength and sensation are intact   Assessment and Plan:

## 2011-02-10 NOTE — Assessment & Plan Note (Signed)
Above goal, though she reports good BP control at home No changes today

## 2011-02-17 ENCOUNTER — Ambulatory Visit (INDEPENDENT_AMBULATORY_CARE_PROVIDER_SITE_OTHER): Payer: Medicare Other | Admitting: *Deleted

## 2011-02-17 DIAGNOSIS — Z7901 Long term (current) use of anticoagulants: Secondary | ICD-10-CM

## 2011-02-17 DIAGNOSIS — I4891 Unspecified atrial fibrillation: Secondary | ICD-10-CM

## 2011-03-03 ENCOUNTER — Ambulatory Visit (INDEPENDENT_AMBULATORY_CARE_PROVIDER_SITE_OTHER): Payer: Medicare Other | Admitting: *Deleted

## 2011-03-03 DIAGNOSIS — I4891 Unspecified atrial fibrillation: Secondary | ICD-10-CM

## 2011-03-03 DIAGNOSIS — Z7901 Long term (current) use of anticoagulants: Secondary | ICD-10-CM

## 2011-03-17 ENCOUNTER — Ambulatory Visit (INDEPENDENT_AMBULATORY_CARE_PROVIDER_SITE_OTHER): Payer: Medicare Other | Admitting: *Deleted

## 2011-03-17 DIAGNOSIS — I4891 Unspecified atrial fibrillation: Secondary | ICD-10-CM

## 2011-03-17 DIAGNOSIS — Z7901 Long term (current) use of anticoagulants: Secondary | ICD-10-CM

## 2011-03-17 LAB — POCT INR: INR: 2.2

## 2011-03-29 ENCOUNTER — Other Ambulatory Visit: Payer: Self-pay | Admitting: Cardiology

## 2011-04-14 ENCOUNTER — Ambulatory Visit (INDEPENDENT_AMBULATORY_CARE_PROVIDER_SITE_OTHER): Payer: Medicare Other | Admitting: *Deleted

## 2011-04-14 DIAGNOSIS — Z7901 Long term (current) use of anticoagulants: Secondary | ICD-10-CM

## 2011-04-14 DIAGNOSIS — I4891 Unspecified atrial fibrillation: Secondary | ICD-10-CM

## 2011-04-14 LAB — POCT INR: INR: 1.7

## 2011-04-23 DIAGNOSIS — H353290 Exudative age-related macular degeneration, unspecified eye, stage unspecified: Secondary | ICD-10-CM | POA: Diagnosis not present

## 2011-04-23 DIAGNOSIS — H35059 Retinal neovascularization, unspecified, unspecified eye: Secondary | ICD-10-CM | POA: Diagnosis not present

## 2011-04-28 ENCOUNTER — Encounter: Payer: Medicare Other | Admitting: *Deleted

## 2011-04-29 ENCOUNTER — Ambulatory Visit (INDEPENDENT_AMBULATORY_CARE_PROVIDER_SITE_OTHER): Payer: Medicare Other | Admitting: *Deleted

## 2011-04-29 DIAGNOSIS — I4891 Unspecified atrial fibrillation: Secondary | ICD-10-CM | POA: Diagnosis not present

## 2011-04-29 DIAGNOSIS — Z7901 Long term (current) use of anticoagulants: Secondary | ICD-10-CM

## 2011-04-29 LAB — POCT INR: INR: 1.8

## 2011-05-05 DIAGNOSIS — S83289A Other tear of lateral meniscus, current injury, unspecified knee, initial encounter: Secondary | ICD-10-CM | POA: Diagnosis not present

## 2011-05-12 ENCOUNTER — Ambulatory Visit (INDEPENDENT_AMBULATORY_CARE_PROVIDER_SITE_OTHER): Payer: Medicare Other | Admitting: Internal Medicine

## 2011-05-12 ENCOUNTER — Ambulatory Visit (INDEPENDENT_AMBULATORY_CARE_PROVIDER_SITE_OTHER): Payer: Medicare Other | Admitting: *Deleted

## 2011-05-12 ENCOUNTER — Encounter: Payer: Self-pay | Admitting: Internal Medicine

## 2011-05-12 VITALS — BP 145/69 | HR 87 | Ht 62.0 in | Wt 157.0 lb

## 2011-05-12 DIAGNOSIS — I4891 Unspecified atrial fibrillation: Secondary | ICD-10-CM

## 2011-05-12 DIAGNOSIS — Z7901 Long term (current) use of anticoagulants: Secondary | ICD-10-CM

## 2011-05-12 LAB — POCT INR: INR: 2.4

## 2011-05-12 NOTE — Assessment & Plan Note (Signed)
Long term persistent afib refractory to ablation and medical therapy Therapeutic strategies for afib including rate control vs repeat ablation were discussed in detail with the patient today.  At this time, she remains clear that she would prefer rate control long term.  Continue coumadin. No changes

## 2011-05-12 NOTE — Progress Notes (Signed)
The patient presents today for routine electrophysiology followup.  Since last being seen in our clinic, the patient reports doing reasonably well. She remains in afib.  She reports occasional palpitations.  She has stable fatigue.  She appears to be tolerating afib reasonably well.  Today, she denies symptoms of chest pain, shortness of breath, orthopnea, PND, lower extremity edema, dizziness, presyncope, syncope, or neurologic sequela.  The patient feels that she is tolerating medications without difficulties and is otherwise without complaint today.   Past Medical History  Diagnosis Date  . Atrial fibrillation     ATRIAL FIBRILLATION S/P  PVI 6/11  . GERD (gastroesophageal reflux disease)   . Macular degeneration   . Hyperlipidemia   . Hypertension    Past Surgical History  Procedure Date  . Total abdominal hysterectomy   . Ablation of dysrhythmic focus 09/2009    s/p PVI by JA    Current Outpatient Prescriptions  Medication Sig Dispense Refill  . acetaminophen (TYLENOL) 100 MG/ML solution Take 10 mg/kg by mouth every 4 (four) hours as needed.        . calcium citrate-vitamin D (CITRACAL+D) 315-200 MG-UNIT per tablet Take 1 tablet by mouth daily.        . ferrous sulfate 325 (65 FE) MG tablet Take 325 mg by mouth daily with breakfast.        . fish oil-omega-3 fatty acids 1000 MG capsule 2 tab po bid      . fluocinonide (LIDEX) 0.05 % external solution 2 (two) times daily. 1-2 GTTS IN EARS TWICE WEEKLY AS NEEDED       . levothyroxine (SYNTHROID, LEVOTHROID) 75 MCG tablet Take 75 mcg by mouth daily.        Marland Kitchen losartan (COZAAR) 100 MG tablet Take 100 mg by mouth daily.        . Multiple Vitamin (MULTIVITAMIN) tablet Take 1 tablet by mouth daily.        . pantoprazole (PROTONIX) 40 MG tablet Take 40 mg by mouth 2 (two) times daily.       . polyethylene glycol powder (GLYCOLAX/MIRALAX) powder Take 17 g by mouth daily. PRN        . pravastatin (PRAVACHOL) 20 MG tablet Take 20 mg by mouth  daily.        . raloxifene (EVISTA) 60 MG tablet Take 60 mg by mouth daily.        . verapamil (CALAN-SR) 180 MG CR tablet Take one tablet twice daily  60 tablet  11  . warfarin (COUMADIN) 5 MG tablet Take as directed by the Anticoagulation Clinic  30 tablet  3    Allergies  Allergen Reactions  . Metoclopramide Hcl     REACTION: nervous, climbs the wall  . Penicillins     REACTION: hives  . Sulfonamide Derivatives     REACTION: rash    History   Social History  . Marital Status: Married    Spouse Name: N/A    Number of Children: N/A  . Years of Education: N/A   Occupational History  . Not on file.   Social History Main Topics  . Smoking status: Never Smoker   . Smokeless tobacco: Not on file  . Alcohol Use: No  . Drug Use: No  . Sexually Active: Not on file   Other Topics Concern  . Not on file   Social History Narrative   Lives with spouse    Family History  Problem Relation Age of Onset  .  Stroke Other   . Coronary artery disease Other      Physical Exam: Filed Vitals:   05/12/11 1106  BP: 145/69  Pulse: 87  Height: 5\' 2"  (1.575 m)  Weight: 157 lb (71.215 kg)    GEN- The patient is well appearing, alert and oriented x 3 today.   Head- normocephalic, atraumatic Eyes-  Sclera clear, conjunctiva pink Ears- hearing intact Oropharynx- clear Neck- supple, no JVP Lymph- no cervical lymphadenopathy Lungs- Clear to ausculation bilaterally, normal work of breathing Heart- irregular rate and rhythm, no murmurs, rubs or gallops, PMI not laterally displaced GI- soft, NT, ND, + BS Extremities- no clubbing, cyanosis, or edema MS- no significant deformity or atrophy Skin- no rash or lesion Psych- euthymic mood, full affect Neuro- strength and sensation are intact  ekg today reveals afib, V rate 87 bpm  Assessment and Plan:

## 2011-05-12 NOTE — Patient Instructions (Signed)
Your physician wants you to follow-up in: 4 months with Dr Allred You will receive a reminder letter in the mail two months in advance. If you don't receive a letter, please call our office to schedule the follow-up appointment.  

## 2011-05-22 DIAGNOSIS — M659 Synovitis and tenosynovitis, unspecified: Secondary | ICD-10-CM | POA: Diagnosis not present

## 2011-05-22 DIAGNOSIS — M9420 Chondromalacia, unspecified site: Secondary | ICD-10-CM | POA: Diagnosis not present

## 2011-05-22 DIAGNOSIS — S83289A Other tear of lateral meniscus, current injury, unspecified knee, initial encounter: Secondary | ICD-10-CM | POA: Diagnosis not present

## 2011-05-22 DIAGNOSIS — M1710 Unilateral primary osteoarthritis, unspecified knee: Secondary | ICD-10-CM | POA: Diagnosis not present

## 2011-05-22 DIAGNOSIS — M23349 Other meniscus derangements, anterior horn of lateral meniscus, unspecified knee: Secondary | ICD-10-CM | POA: Diagnosis not present

## 2011-05-26 ENCOUNTER — Ambulatory Visit (INDEPENDENT_AMBULATORY_CARE_PROVIDER_SITE_OTHER): Payer: Medicare Other | Admitting: *Deleted

## 2011-05-26 DIAGNOSIS — I4891 Unspecified atrial fibrillation: Secondary | ICD-10-CM | POA: Diagnosis not present

## 2011-05-26 DIAGNOSIS — Z7901 Long term (current) use of anticoagulants: Secondary | ICD-10-CM | POA: Diagnosis not present

## 2011-05-26 LAB — POCT INR: INR: 1.4

## 2011-06-09 ENCOUNTER — Ambulatory Visit (INDEPENDENT_AMBULATORY_CARE_PROVIDER_SITE_OTHER): Payer: Medicare Other

## 2011-06-09 DIAGNOSIS — S83289A Other tear of lateral meniscus, current injury, unspecified knee, initial encounter: Secondary | ICD-10-CM | POA: Diagnosis not present

## 2011-06-09 DIAGNOSIS — Z7901 Long term (current) use of anticoagulants: Secondary | ICD-10-CM

## 2011-06-09 DIAGNOSIS — I4891 Unspecified atrial fibrillation: Secondary | ICD-10-CM | POA: Diagnosis not present

## 2011-06-09 LAB — POCT INR: INR: 2.4

## 2011-06-23 DIAGNOSIS — H35059 Retinal neovascularization, unspecified, unspecified eye: Secondary | ICD-10-CM | POA: Diagnosis not present

## 2011-06-23 DIAGNOSIS — H353290 Exudative age-related macular degeneration, unspecified eye, stage unspecified: Secondary | ICD-10-CM | POA: Diagnosis not present

## 2011-06-27 DIAGNOSIS — H353190 Nonexudative age-related macular degeneration, unspecified eye, stage unspecified: Secondary | ICD-10-CM | POA: Diagnosis not present

## 2011-06-27 DIAGNOSIS — H353290 Exudative age-related macular degeneration, unspecified eye, stage unspecified: Secondary | ICD-10-CM | POA: Diagnosis not present

## 2011-06-27 DIAGNOSIS — H35059 Retinal neovascularization, unspecified, unspecified eye: Secondary | ICD-10-CM | POA: Diagnosis not present

## 2011-06-30 ENCOUNTER — Ambulatory Visit (INDEPENDENT_AMBULATORY_CARE_PROVIDER_SITE_OTHER): Payer: Medicare Other

## 2011-06-30 DIAGNOSIS — Z7901 Long term (current) use of anticoagulants: Secondary | ICD-10-CM

## 2011-06-30 DIAGNOSIS — I4891 Unspecified atrial fibrillation: Secondary | ICD-10-CM | POA: Diagnosis not present

## 2011-07-28 ENCOUNTER — Ambulatory Visit (INDEPENDENT_AMBULATORY_CARE_PROVIDER_SITE_OTHER): Payer: Medicare Other | Admitting: *Deleted

## 2011-07-28 DIAGNOSIS — I4891 Unspecified atrial fibrillation: Secondary | ICD-10-CM

## 2011-07-28 DIAGNOSIS — Z7901 Long term (current) use of anticoagulants: Secondary | ICD-10-CM

## 2011-07-30 DIAGNOSIS — H35059 Retinal neovascularization, unspecified, unspecified eye: Secondary | ICD-10-CM | POA: Diagnosis not present

## 2011-07-30 DIAGNOSIS — H353290 Exudative age-related macular degeneration, unspecified eye, stage unspecified: Secondary | ICD-10-CM | POA: Diagnosis not present

## 2011-08-04 DIAGNOSIS — H353290 Exudative age-related macular degeneration, unspecified eye, stage unspecified: Secondary | ICD-10-CM | POA: Diagnosis not present

## 2011-08-04 DIAGNOSIS — H35059 Retinal neovascularization, unspecified, unspecified eye: Secondary | ICD-10-CM | POA: Diagnosis not present

## 2011-08-12 ENCOUNTER — Telehealth: Payer: Self-pay | Admitting: Cardiology

## 2011-08-12 NOTE — Telephone Encounter (Signed)
New Problem:     I called the patient and was unable to reach them. I left a message on their voicemail with my name, the reason I called, the name of his physician, and a number to call back to schedule their appointment. 

## 2011-08-16 ENCOUNTER — Other Ambulatory Visit: Payer: Self-pay | Admitting: Cardiology

## 2011-08-20 DIAGNOSIS — M899 Disorder of bone, unspecified: Secondary | ICD-10-CM | POA: Diagnosis not present

## 2011-08-20 DIAGNOSIS — E039 Hypothyroidism, unspecified: Secondary | ICD-10-CM | POA: Diagnosis not present

## 2011-08-20 DIAGNOSIS — M949 Disorder of cartilage, unspecified: Secondary | ICD-10-CM | POA: Diagnosis not present

## 2011-08-20 DIAGNOSIS — E785 Hyperlipidemia, unspecified: Secondary | ICD-10-CM | POA: Diagnosis not present

## 2011-08-20 DIAGNOSIS — I10 Essential (primary) hypertension: Secondary | ICD-10-CM | POA: Diagnosis not present

## 2011-08-25 ENCOUNTER — Ambulatory Visit (INDEPENDENT_AMBULATORY_CARE_PROVIDER_SITE_OTHER): Payer: Medicare Other | Admitting: *Deleted

## 2011-08-25 DIAGNOSIS — Z1212 Encounter for screening for malignant neoplasm of rectum: Secondary | ICD-10-CM | POA: Diagnosis not present

## 2011-08-25 DIAGNOSIS — Z7901 Long term (current) use of anticoagulants: Secondary | ICD-10-CM | POA: Diagnosis not present

## 2011-08-25 DIAGNOSIS — I4891 Unspecified atrial fibrillation: Secondary | ICD-10-CM

## 2011-08-25 LAB — POCT INR: INR: 1.8

## 2011-08-28 DIAGNOSIS — E039 Hypothyroidism, unspecified: Secondary | ICD-10-CM | POA: Diagnosis not present

## 2011-08-28 DIAGNOSIS — I4891 Unspecified atrial fibrillation: Secondary | ICD-10-CM | POA: Diagnosis not present

## 2011-08-28 DIAGNOSIS — I10 Essential (primary) hypertension: Secondary | ICD-10-CM | POA: Diagnosis not present

## 2011-08-28 DIAGNOSIS — Z0000 Encounter for general adult medical examination without abnormal findings: Secondary | ICD-10-CM | POA: Diagnosis not present

## 2011-09-03 DIAGNOSIS — H353290 Exudative age-related macular degeneration, unspecified eye, stage unspecified: Secondary | ICD-10-CM | POA: Diagnosis not present

## 2011-09-03 DIAGNOSIS — H35359 Cystoid macular degeneration, unspecified eye: Secondary | ICD-10-CM | POA: Diagnosis not present

## 2011-09-03 DIAGNOSIS — H35059 Retinal neovascularization, unspecified, unspecified eye: Secondary | ICD-10-CM | POA: Diagnosis not present

## 2011-09-09 ENCOUNTER — Ambulatory Visit (INDEPENDENT_AMBULATORY_CARE_PROVIDER_SITE_OTHER): Payer: Medicare Other

## 2011-09-09 ENCOUNTER — Encounter: Payer: Self-pay | Admitting: Cardiology

## 2011-09-09 ENCOUNTER — Ambulatory Visit (INDEPENDENT_AMBULATORY_CARE_PROVIDER_SITE_OTHER): Payer: Medicare Other | Admitting: Cardiology

## 2011-09-09 VITALS — BP 120/60 | HR 79 | Ht 61.0 in | Wt 159.0 lb

## 2011-09-09 DIAGNOSIS — I471 Supraventricular tachycardia, unspecified: Secondary | ICD-10-CM

## 2011-09-09 DIAGNOSIS — E7800 Pure hypercholesterolemia, unspecified: Secondary | ICD-10-CM

## 2011-09-09 DIAGNOSIS — Z7901 Long term (current) use of anticoagulants: Secondary | ICD-10-CM | POA: Diagnosis not present

## 2011-09-09 DIAGNOSIS — I4891 Unspecified atrial fibrillation: Secondary | ICD-10-CM

## 2011-09-09 DIAGNOSIS — I10 Essential (primary) hypertension: Secondary | ICD-10-CM

## 2011-09-09 NOTE — Patient Instructions (Signed)
Your physician wants you to follow-up in: 6 MONTHS WITH DR CRENSHAW You will receive a reminder letter in the mail two months in advance. If you don't receive a letter, please call our office to schedule the follow-up appointment.  

## 2011-09-09 NOTE — Assessment & Plan Note (Signed)
Blood pressure controlled. Continue present medications. 

## 2011-09-09 NOTE — Assessment & Plan Note (Signed)
Management per primary care. 

## 2011-09-09 NOTE — Assessment & Plan Note (Signed)
-   Continue verapamil 

## 2011-09-09 NOTE — Assessment & Plan Note (Signed)
Patient remains in atrial fibrillation. We have now decided that rate control and anticoagulation will be the best long-term option. Continue verapamil. Continue Coumadin with goal INR 2-3.

## 2011-09-09 NOTE — Progress Notes (Signed)
HPI: Ms. Finchum is a pleasant female who has a history of paroxysmal atrial fibrillation and SVT. Her LV function is normal. Last echocardiogram in June of 2011 showed normal LV function. There was trivial aortic and mitral regurgitation. Also note she had a cardiac catheterization in December 2003 that showed a 20% first obtuse marginal but otherwise no obstructive disease. She was referred for atrial fibrillation ablation. She had this procedure in June of 2011. Following the procedure she did have recurrent atrial fibrillation. She was seen by Dr. Johney Frame and consideration of repeat ablation was discussed. However the decision has been made for rate control and anticoagulation. Since I last saw her, she does describe fatigue but denies dyspnea, chest pain, palpitations, syncope or bleeding.   Current Outpatient Prescriptions  Medication Sig Dispense Refill  . acetaminophen (TYLENOL) 100 MG/ML solution Take 10 mg/kg by mouth every 4 (four) hours as needed.        . ALPRAZolam (XANAX) 0.25 MG tablet Take 0.25 mg by mouth at bedtime as needed.      . calcium citrate-vitamin D (CITRACAL+D) 315-200 MG-UNIT per tablet Take 1 tablet by mouth daily.        . ferrous sulfate 325 (65 FE) MG tablet Take 325 mg by mouth daily with breakfast.        . fish oil-omega-3 fatty acids 1000 MG capsule 2 tab po bid      . fluocinonide (LIDEX) 0.05 % external solution 2 (two) times daily. 1-2 GTTS IN EARS TWICE WEEKLY AS NEEDED       . levothyroxine (SYNTHROID, LEVOTHROID) 75 MCG tablet Take 75 mcg by mouth daily.        Marland Kitchen losartan (COZAAR) 100 MG tablet Take 100 mg by mouth daily.        . Multiple Vitamin (MULTIVITAMIN) tablet Take 1 tablet by mouth daily.        . pantoprazole (PROTONIX) 40 MG tablet Take 40 mg by mouth 2 (two) times daily.       . polyethylene glycol powder (GLYCOLAX/MIRALAX) powder Take 17 g by mouth daily. PRN        . pravastatin (PRAVACHOL) 20 MG tablet Take 20 mg by mouth daily.          . raloxifene (EVISTA) 60 MG tablet Take 60 mg by mouth daily.        . verapamil (CALAN-SR) 180 MG CR tablet Take one tablet twice daily  60 tablet  11  . warfarin (COUMADIN) 5 MG tablet TAKE AS DIRECTED BY MOUTH PER THE ANTICOAGULATION CLINIC  30 tablet  3     Past Medical History  Diagnosis Date  . Atrial fibrillation     ATRIAL FIBRILLATION S/P  PVI 6/11  . GERD (gastroesophageal reflux disease)   . Macular degeneration   . Hyperlipidemia   . Hypertension   . SVT (supraventricular tachycardia)     Past Surgical History  Procedure Date  . Total abdominal hysterectomy   . Ablation of dysrhythmic focus 09/2009    s/p PVI by JA    History   Social History  . Marital Status: Married    Spouse Name: N/A    Number of Children: N/A  . Years of Education: N/A   Occupational History  . Not on file.   Social History Main Topics  . Smoking status: Never Smoker   . Smokeless tobacco: Not on file  . Alcohol Use: No  . Drug Use: No  . Sexually Active:  Not on file   Other Topics Concern  . Not on file   Social History Narrative   Lives with spouse    ROS: fatigue but no fevers or chills, productive cough, hemoptysis, dysphasia, odynophagia, melena, hematochezia, dysuria, hematuria, rash, seizure activity, orthopnea, PND, pedal edema, claudication. Remaining systems are negative.  Physical Exam: Well-developed well-nourished in no acute distress.  Skin is warm and dry.  HEENT is normal.  Neck is supple.  Chest is clear to auscultation with normal expansion.  Cardiovascular exam is irregular Abdominal exam nontender or distended. No masses palpated. Extremities show no edema. neuro grossly intact  ECG atrial fibrillation at a rate of 79. No significant ST changes.

## 2011-09-10 DIAGNOSIS — H353290 Exudative age-related macular degeneration, unspecified eye, stage unspecified: Secondary | ICD-10-CM | POA: Diagnosis not present

## 2011-09-10 DIAGNOSIS — H35059 Retinal neovascularization, unspecified, unspecified eye: Secondary | ICD-10-CM | POA: Diagnosis not present

## 2011-10-07 ENCOUNTER — Ambulatory Visit (INDEPENDENT_AMBULATORY_CARE_PROVIDER_SITE_OTHER): Payer: Medicare Other | Admitting: *Deleted

## 2011-10-07 DIAGNOSIS — I4891 Unspecified atrial fibrillation: Secondary | ICD-10-CM

## 2011-10-07 DIAGNOSIS — Z7901 Long term (current) use of anticoagulants: Secondary | ICD-10-CM

## 2011-10-13 ENCOUNTER — Ambulatory Visit: Payer: Medicare Other | Admitting: Internal Medicine

## 2011-10-13 DIAGNOSIS — H353290 Exudative age-related macular degeneration, unspecified eye, stage unspecified: Secondary | ICD-10-CM | POA: Diagnosis not present

## 2011-10-13 DIAGNOSIS — H35359 Cystoid macular degeneration, unspecified eye: Secondary | ICD-10-CM | POA: Diagnosis not present

## 2011-10-13 DIAGNOSIS — H353190 Nonexudative age-related macular degeneration, unspecified eye, stage unspecified: Secondary | ICD-10-CM | POA: Diagnosis not present

## 2011-10-20 DIAGNOSIS — H35059 Retinal neovascularization, unspecified, unspecified eye: Secondary | ICD-10-CM | POA: Diagnosis not present

## 2011-10-20 DIAGNOSIS — H353290 Exudative age-related macular degeneration, unspecified eye, stage unspecified: Secondary | ICD-10-CM | POA: Diagnosis not present

## 2011-11-04 ENCOUNTER — Ambulatory Visit (INDEPENDENT_AMBULATORY_CARE_PROVIDER_SITE_OTHER): Payer: Medicare Other | Admitting: *Deleted

## 2011-11-04 DIAGNOSIS — Z7901 Long term (current) use of anticoagulants: Secondary | ICD-10-CM | POA: Diagnosis not present

## 2011-11-04 DIAGNOSIS — I4891 Unspecified atrial fibrillation: Secondary | ICD-10-CM

## 2011-11-04 LAB — POCT INR: INR: 2.9

## 2011-11-13 DIAGNOSIS — H353290 Exudative age-related macular degeneration, unspecified eye, stage unspecified: Secondary | ICD-10-CM | POA: Diagnosis not present

## 2011-11-13 DIAGNOSIS — H35359 Cystoid macular degeneration, unspecified eye: Secondary | ICD-10-CM | POA: Diagnosis not present

## 2011-11-13 DIAGNOSIS — H35059 Retinal neovascularization, unspecified, unspecified eye: Secondary | ICD-10-CM | POA: Diagnosis not present

## 2011-11-25 DIAGNOSIS — R82998 Other abnormal findings in urine: Secondary | ICD-10-CM | POA: Diagnosis not present

## 2011-11-29 ENCOUNTER — Other Ambulatory Visit: Payer: Self-pay | Admitting: Cardiology

## 2011-12-10 DIAGNOSIS — H35059 Retinal neovascularization, unspecified, unspecified eye: Secondary | ICD-10-CM | POA: Diagnosis not present

## 2011-12-10 DIAGNOSIS — H353290 Exudative age-related macular degeneration, unspecified eye, stage unspecified: Secondary | ICD-10-CM | POA: Diagnosis not present

## 2011-12-10 DIAGNOSIS — H35359 Cystoid macular degeneration, unspecified eye: Secondary | ICD-10-CM | POA: Diagnosis not present

## 2011-12-16 ENCOUNTER — Ambulatory Visit (INDEPENDENT_AMBULATORY_CARE_PROVIDER_SITE_OTHER): Payer: Medicare Other | Admitting: *Deleted

## 2011-12-16 DIAGNOSIS — I4891 Unspecified atrial fibrillation: Secondary | ICD-10-CM | POA: Diagnosis not present

## 2011-12-16 DIAGNOSIS — Z7901 Long term (current) use of anticoagulants: Secondary | ICD-10-CM

## 2011-12-16 LAB — POCT INR: INR: 2.8

## 2011-12-24 DIAGNOSIS — H353290 Exudative age-related macular degeneration, unspecified eye, stage unspecified: Secondary | ICD-10-CM | POA: Diagnosis not present

## 2011-12-24 DIAGNOSIS — H35059 Retinal neovascularization, unspecified, unspecified eye: Secondary | ICD-10-CM | POA: Diagnosis not present

## 2011-12-31 DIAGNOSIS — K219 Gastro-esophageal reflux disease without esophagitis: Secondary | ICD-10-CM | POA: Diagnosis not present

## 2011-12-31 DIAGNOSIS — R059 Cough, unspecified: Secondary | ICD-10-CM | POA: Diagnosis not present

## 2011-12-31 DIAGNOSIS — I10 Essential (primary) hypertension: Secondary | ICD-10-CM | POA: Diagnosis not present

## 2011-12-31 DIAGNOSIS — R05 Cough: Secondary | ICD-10-CM | POA: Diagnosis not present

## 2011-12-31 DIAGNOSIS — I4891 Unspecified atrial fibrillation: Secondary | ICD-10-CM | POA: Diagnosis not present

## 2012-01-27 ENCOUNTER — Ambulatory Visit (INDEPENDENT_AMBULATORY_CARE_PROVIDER_SITE_OTHER): Payer: Medicare Other

## 2012-01-27 DIAGNOSIS — I4891 Unspecified atrial fibrillation: Secondary | ICD-10-CM | POA: Diagnosis not present

## 2012-01-27 DIAGNOSIS — Z23 Encounter for immunization: Secondary | ICD-10-CM | POA: Diagnosis not present

## 2012-01-27 DIAGNOSIS — Z7901 Long term (current) use of anticoagulants: Secondary | ICD-10-CM

## 2012-01-27 LAB — POCT INR: INR: 2.4

## 2012-02-04 DIAGNOSIS — H35059 Retinal neovascularization, unspecified, unspecified eye: Secondary | ICD-10-CM | POA: Diagnosis not present

## 2012-02-04 DIAGNOSIS — H353290 Exudative age-related macular degeneration, unspecified eye, stage unspecified: Secondary | ICD-10-CM | POA: Diagnosis not present

## 2012-02-11 DIAGNOSIS — H353290 Exudative age-related macular degeneration, unspecified eye, stage unspecified: Secondary | ICD-10-CM | POA: Diagnosis not present

## 2012-03-02 ENCOUNTER — Other Ambulatory Visit: Payer: Self-pay | Admitting: *Deleted

## 2012-03-02 MED ORDER — VERAPAMIL HCL ER 180 MG PO TBCR: 180.0000 mg | EXTENDED_RELEASE_TABLET | Freq: Two times a day (BID) | ORAL | Status: DC

## 2012-03-09 ENCOUNTER — Ambulatory Visit (INDEPENDENT_AMBULATORY_CARE_PROVIDER_SITE_OTHER): Payer: Medicare Other | Admitting: *Deleted

## 2012-03-09 DIAGNOSIS — I4891 Unspecified atrial fibrillation: Secondary | ICD-10-CM

## 2012-03-09 DIAGNOSIS — Z7901 Long term (current) use of anticoagulants: Secondary | ICD-10-CM | POA: Diagnosis not present

## 2012-03-09 LAB — POCT INR: INR: 2.3

## 2012-03-10 DIAGNOSIS — H35059 Retinal neovascularization, unspecified, unspecified eye: Secondary | ICD-10-CM | POA: Diagnosis not present

## 2012-03-10 DIAGNOSIS — H353290 Exudative age-related macular degeneration, unspecified eye, stage unspecified: Secondary | ICD-10-CM | POA: Diagnosis not present

## 2012-03-15 ENCOUNTER — Ambulatory Visit (INDEPENDENT_AMBULATORY_CARE_PROVIDER_SITE_OTHER): Payer: Medicare Other | Admitting: Cardiology

## 2012-03-15 ENCOUNTER — Encounter: Payer: Self-pay | Admitting: Cardiology

## 2012-03-15 VITALS — BP 140/70 | HR 91 | Ht 61.2 in | Wt 156.0 lb

## 2012-03-15 DIAGNOSIS — I471 Supraventricular tachycardia: Secondary | ICD-10-CM | POA: Diagnosis not present

## 2012-03-15 DIAGNOSIS — I10 Essential (primary) hypertension: Secondary | ICD-10-CM | POA: Diagnosis not present

## 2012-03-15 DIAGNOSIS — E7800 Pure hypercholesterolemia, unspecified: Secondary | ICD-10-CM | POA: Diagnosis not present

## 2012-03-15 DIAGNOSIS — I4891 Unspecified atrial fibrillation: Secondary | ICD-10-CM

## 2012-03-15 LAB — BASIC METABOLIC PANEL
BUN: 17 mg/dL (ref 6–23)
Chloride: 108 mEq/L (ref 96–112)
GFR: 68.91 mL/min (ref >60.00)
Glucose, Bld: 93 mg/dL (ref 70–99)
Potassium: 4 mEq/L (ref 3.5–5.1)
Sodium: 139 mEq/L (ref 135–145)

## 2012-03-15 LAB — CBC WITH DIFFERENTIAL/PLATELET
Basophils Relative: 0.6 % (ref 0.0–3.0)
Eosinophils Relative: 2.7 % (ref 0.0–5.0)
Lymphocytes Relative: 29 % (ref 12.0–46.0)
Monocytes Absolute: 0.5 10*3/uL (ref 0.1–1.0)
Neutrophils Relative %: 56.1 % (ref 43.0–77.0)
Platelets: 177 10*3/uL (ref 150.0–400.0)
RBC: 4.16 Mil/uL (ref 3.87–5.11)
WBC: 3.9 10*3/uL — ABNORMAL LOW (ref 4.5–10.5)

## 2012-03-15 NOTE — Assessment & Plan Note (Signed)
-   Continue verapamil 

## 2012-03-15 NOTE — Patient Instructions (Addendum)
Your physician wants you to follow-up in: ONE YEAR WITH DR Shelda Pal will receive a reminder letter in the mail two months in advance. If you don't receive a letter, please call our office to schedule the follow-up appointment.   Your physician recommends that you HAVE LAB WORK TODAY  XARELTO OR PRADAXA OR ELIQUIS TO REPLACE COUMADIN

## 2012-03-15 NOTE — Assessment & Plan Note (Signed)
Patient doing well symptomatically. Continue medications for rate control and continue Coumadin. Patient was provided with the names of pradaxa, xeralto and apixiban. She will check with her insurance company to see if they are covered. If so we will consider changing. Check hemoglobin and renal function.

## 2012-03-15 NOTE — Assessment & Plan Note (Signed)
Blood pressure controlled. Continue present medications. 

## 2012-03-15 NOTE — Assessment & Plan Note (Signed)
Continue statin. Management per primary care. 

## 2012-03-15 NOTE — Progress Notes (Signed)
HPI: Sylvia [name] is a pleasant female who has a history of paroxysmal atrial fibrillation and SVT. Her LV function is normal. Last echocardiogram in June of 2011 showed normal LV function. There was trivial aortic and mitral regurgitation. Also note she had a cardiac catheterization in December 2003 that showed a 20% first obtuse marginal but otherwise no obstructive disease. She was referred for atrial fibrillation ablation. She had this procedure in June of 2011. Following the procedure she did have recurrent atrial fibrillation. She was seen by Dr. Johney Frame and consideration of repeat ablation was discussed. However the decision has been made for rate control and anticoagulation. Since I last saw her in May of 2013, she has some dyspnea on exertion but no orthopnea, PND, pedal edema, palpitations, syncope, chest pain or bleeding.   Current Outpatient Prescriptions  Medication Sig Dispense Refill  . acetaminophen (TYLENOL) 100 MG/ML solution Take 10 mg/kg by mouth every 4 (four) hours as needed.        . ALPRAZolam (XANAX) 0.25 MG tablet Take 0.25 mg by mouth at bedtime as needed.      . calcium citrate-vitamin D (CITRACAL+D) 315-200 MG-UNIT per tablet Take 1 tablet by mouth daily.        . ferrous sulfate 325 (65 FE) MG tablet Take 325 mg by mouth daily with breakfast.        . fish oil-omega-3 fatty acids 1000 MG capsule 2 tab po bid      . fluocinonide (LIDEX) 0.05 % external solution 2 (two) times daily. 1-2 GTTS IN EARS TWICE WEEKLY AS NEEDED       . levothyroxine (SYNTHROID, LEVOTHROID) 75 MCG tablet Take 75 mcg by mouth daily.        Marland Kitchen losartan (COZAAR) 100 MG tablet Take 100 mg by mouth daily.        . Multiple Vitamin (MULTIVITAMIN) tablet Take 1 tablet by mouth daily.        . pantoprazole (PROTONIX) 40 MG tablet Take 40 mg by mouth 2 (two) times daily.       . polyethylene glycol powder (GLYCOLAX/MIRALAX) powder Take 17 g by mouth daily. PRN        . pravastatin (PRAVACHOL) 20 MG  tablet Take 20 mg by mouth daily.        . raloxifene (EVISTA) 60 MG tablet Take 60 mg by mouth daily.        . verapamil (CALAN-SR) 180 MG CR tablet Take 1 tablet (180 mg total) by mouth 2 (two) times daily.  60 tablet  5  . warfarin (COUMADIN) 5 MG tablet TAKE AS DIRECTED PER  THE  ANTICAGULATION  CLINIC  30 tablet  3     Past Medical History  Diagnosis Date  . Atrial fibrillation     ATRIAL FIBRILLATION S/P  PVI 6/11  . GERD (gastroesophageal reflux disease)   . Macular degeneration   . Hyperlipidemia   . Hypertension   . SVT (supraventricular tachycardia)     Past Surgical History  Procedure Date  . Total abdominal hysterectomy   . Ablation of dysrhythmic focus 09/2009    s/p PVI by JA    History   Social History  . Marital Status: Married    Spouse Name: N/A    Number of Children: N/A  . Years of Education: N/A   Occupational History  . Not on file.   Social History Main Topics  . Smoking status: Never Smoker   . Smokeless tobacco: Not  on file  . Alcohol Use: No  . Drug Use: No  . Sexually Active: Not on file   Other Topics Concern  . Not on file   Social History Narrative   Lives with spouse    ROS: fatigue but no fevers or chills, productive cough, hemoptysis, dysphasia, odynophagia, melena, hematochezia, dysuria, hematuria, rash, seizure activity, orthopnea, PND, pedal edema, claudication. Remaining systems are negative.  Physical Exam: Well-developed well-nourished in no acute distress.  Skin is warm and dry.  HEENT is normal.  Neck is supple.  Chest is clear to auscultation with normal expansion.  Cardiovascular exam is irregular Abdominal exam nontender or distended. No masses palpated. Extremities show no edema. neuro grossly intact  ECG atrial fibrillation at a rate of 91. Nonspecific ST changes.

## 2012-03-24 DIAGNOSIS — H35059 Retinal neovascularization, unspecified, unspecified eye: Secondary | ICD-10-CM | POA: Diagnosis not present

## 2012-03-24 DIAGNOSIS — H353290 Exudative age-related macular degeneration, unspecified eye, stage unspecified: Secondary | ICD-10-CM | POA: Diagnosis not present

## 2012-04-05 ENCOUNTER — Other Ambulatory Visit: Payer: Self-pay | Admitting: *Deleted

## 2012-04-05 MED ORDER — WARFARIN SODIUM 5 MG PO TABS: 5.0000 mg | ORAL_TABLET | ORAL | Status: DC

## 2012-04-20 ENCOUNTER — Ambulatory Visit (INDEPENDENT_AMBULATORY_CARE_PROVIDER_SITE_OTHER): Payer: Medicare Other

## 2012-04-20 DIAGNOSIS — Z7901 Long term (current) use of anticoagulants: Secondary | ICD-10-CM | POA: Diagnosis not present

## 2012-04-20 DIAGNOSIS — I4891 Unspecified atrial fibrillation: Secondary | ICD-10-CM | POA: Diagnosis not present

## 2012-04-20 LAB — POCT INR: INR: 2.5

## 2012-04-21 DIAGNOSIS — H35059 Retinal neovascularization, unspecified, unspecified eye: Secondary | ICD-10-CM | POA: Diagnosis not present

## 2012-04-21 DIAGNOSIS — H353290 Exudative age-related macular degeneration, unspecified eye, stage unspecified: Secondary | ICD-10-CM | POA: Diagnosis not present

## 2012-05-05 DIAGNOSIS — H353290 Exudative age-related macular degeneration, unspecified eye, stage unspecified: Secondary | ICD-10-CM | POA: Diagnosis not present

## 2012-05-05 DIAGNOSIS — H35059 Retinal neovascularization, unspecified, unspecified eye: Secondary | ICD-10-CM | POA: Diagnosis not present

## 2012-05-06 DIAGNOSIS — R21 Rash and other nonspecific skin eruption: Secondary | ICD-10-CM | POA: Diagnosis not present

## 2012-05-10 ENCOUNTER — Ambulatory Visit (INDEPENDENT_AMBULATORY_CARE_PROVIDER_SITE_OTHER): Payer: Medicare Other | Admitting: *Deleted

## 2012-05-10 DIAGNOSIS — Z7901 Long term (current) use of anticoagulants: Secondary | ICD-10-CM

## 2012-05-10 DIAGNOSIS — I4891 Unspecified atrial fibrillation: Secondary | ICD-10-CM | POA: Diagnosis not present

## 2012-06-01 ENCOUNTER — Ambulatory Visit (INDEPENDENT_AMBULATORY_CARE_PROVIDER_SITE_OTHER): Payer: Medicare Other | Admitting: *Deleted

## 2012-06-01 DIAGNOSIS — Z7901 Long term (current) use of anticoagulants: Secondary | ICD-10-CM | POA: Diagnosis not present

## 2012-06-01 DIAGNOSIS — I4891 Unspecified atrial fibrillation: Secondary | ICD-10-CM

## 2012-06-01 LAB — POCT INR: INR: 2.8

## 2012-06-02 DIAGNOSIS — H35059 Retinal neovascularization, unspecified, unspecified eye: Secondary | ICD-10-CM | POA: Diagnosis not present

## 2012-06-02 DIAGNOSIS — H353290 Exudative age-related macular degeneration, unspecified eye, stage unspecified: Secondary | ICD-10-CM | POA: Diagnosis not present

## 2012-06-10 ENCOUNTER — Telehealth: Payer: Self-pay | Admitting: Internal Medicine

## 2012-06-10 NOTE — Telephone Encounter (Signed)
New Problem:     Patient called in wanting to speak with you about a sleep aid interfering with her coumadin.  Please call back.

## 2012-06-11 NOTE — Telephone Encounter (Signed)
Pt states she has bought  Equate  Sleep aid (Diphenhydramine ) and wanted to know if it would have any effect on her coumadin and pt instructed that this medication has no interaction with her coumadin and she states understanding.

## 2012-06-16 DIAGNOSIS — H35059 Retinal neovascularization, unspecified, unspecified eye: Secondary | ICD-10-CM | POA: Diagnosis not present

## 2012-06-16 DIAGNOSIS — H353290 Exudative age-related macular degeneration, unspecified eye, stage unspecified: Secondary | ICD-10-CM | POA: Diagnosis not present

## 2012-06-17 ENCOUNTER — Telehealth: Payer: Self-pay | Admitting: Cardiology

## 2012-06-17 NOTE — Telephone Encounter (Signed)
Pt has no energy and has had a change in dose of her verapamil

## 2012-06-23 ENCOUNTER — Telehealth: Payer: Self-pay | Admitting: Cardiology

## 2012-06-23 NOTE — Telephone Encounter (Signed)
Spoke with pt, she does not have any energy and feels it maybe related to the dosage of verapamil and is requesting if it can be decreased. I explained to pt that if the dosage is decreased she may have more problems with the control of her heart rate, she reported to me her heart rate runs 75 to 85 all the time now.  Will forward for dr Jens Som review.

## 2012-06-23 NOTE — Telephone Encounter (Signed)
New Problem:    Patient called in because she was feeling tired and bad all the time and wanted to know if she could decrease her verapamil (CALAN-SR) 180 MG CR tablet dose.  Please call back.

## 2012-06-24 NOTE — Telephone Encounter (Signed)
Pt rtn your call

## 2012-06-24 NOTE — Telephone Encounter (Signed)
Would continue present dose to keep HR controlled Sylvia [name]

## 2012-06-24 NOTE — Telephone Encounter (Signed)
Left message for pt to call.

## 2012-06-24 NOTE — Telephone Encounter (Signed)
Spoke with pt, Aware of dr crenshaw's recommendations.  °

## 2012-07-12 ENCOUNTER — Ambulatory Visit (INDEPENDENT_AMBULATORY_CARE_PROVIDER_SITE_OTHER): Payer: Medicare Other | Admitting: *Deleted

## 2012-07-12 DIAGNOSIS — Z7901 Long term (current) use of anticoagulants: Secondary | ICD-10-CM

## 2012-07-12 DIAGNOSIS — I4891 Unspecified atrial fibrillation: Secondary | ICD-10-CM

## 2012-07-12 LAB — POCT INR: INR: 2.3

## 2012-07-16 DIAGNOSIS — H3581 Retinal edema: Secondary | ICD-10-CM | POA: Diagnosis not present

## 2012-07-16 DIAGNOSIS — H3530 Unspecified macular degeneration: Secondary | ICD-10-CM | POA: Diagnosis not present

## 2012-07-16 DIAGNOSIS — Z961 Presence of intraocular lens: Secondary | ICD-10-CM | POA: Diagnosis not present

## 2012-07-27 DIAGNOSIS — E039 Hypothyroidism, unspecified: Secondary | ICD-10-CM | POA: Diagnosis not present

## 2012-07-28 DIAGNOSIS — H35059 Retinal neovascularization, unspecified, unspecified eye: Secondary | ICD-10-CM | POA: Diagnosis not present

## 2012-07-28 DIAGNOSIS — H353290 Exudative age-related macular degeneration, unspecified eye, stage unspecified: Secondary | ICD-10-CM | POA: Diagnosis not present

## 2012-07-28 DIAGNOSIS — H353190 Nonexudative age-related macular degeneration, unspecified eye, stage unspecified: Secondary | ICD-10-CM | POA: Diagnosis not present

## 2012-08-03 DIAGNOSIS — E039 Hypothyroidism, unspecified: Secondary | ICD-10-CM | POA: Diagnosis not present

## 2012-08-04 DIAGNOSIS — H353290 Exudative age-related macular degeneration, unspecified eye, stage unspecified: Secondary | ICD-10-CM | POA: Diagnosis not present

## 2012-08-04 DIAGNOSIS — H35059 Retinal neovascularization, unspecified, unspecified eye: Secondary | ICD-10-CM | POA: Diagnosis not present

## 2012-08-16 ENCOUNTER — Other Ambulatory Visit: Payer: Self-pay | Admitting: Cardiology

## 2012-08-23 ENCOUNTER — Ambulatory Visit (INDEPENDENT_AMBULATORY_CARE_PROVIDER_SITE_OTHER): Payer: Medicare Other | Admitting: Pharmacist

## 2012-08-23 DIAGNOSIS — I4891 Unspecified atrial fibrillation: Secondary | ICD-10-CM | POA: Diagnosis not present

## 2012-08-23 DIAGNOSIS — Z7901 Long term (current) use of anticoagulants: Secondary | ICD-10-CM

## 2012-08-23 LAB — POCT INR: INR: 2.7

## 2012-09-02 ENCOUNTER — Other Ambulatory Visit: Payer: Self-pay | Admitting: *Deleted

## 2012-09-02 MED ORDER — VERAPAMIL HCL ER 180 MG PO TBCR: 180.0000 mg | EXTENDED_RELEASE_TABLET | Freq: Two times a day (BID) | ORAL | Status: DC

## 2012-09-02 NOTE — Telephone Encounter (Signed)
Pt calling to make [facility] [address] her [facility] of choice.

## 2012-09-08 DIAGNOSIS — M899 Disorder of bone, unspecified: Secondary | ICD-10-CM | POA: Diagnosis not present

## 2012-09-08 DIAGNOSIS — M949 Disorder of cartilage, unspecified: Secondary | ICD-10-CM | POA: Diagnosis not present

## 2012-09-08 DIAGNOSIS — E785 Hyperlipidemia, unspecified: Secondary | ICD-10-CM | POA: Diagnosis not present

## 2012-09-08 DIAGNOSIS — E039 Hypothyroidism, unspecified: Secondary | ICD-10-CM | POA: Diagnosis not present

## 2012-09-08 DIAGNOSIS — I10 Essential (primary) hypertension: Secondary | ICD-10-CM | POA: Diagnosis not present

## 2012-09-09 DIAGNOSIS — H35059 Retinal neovascularization, unspecified, unspecified eye: Secondary | ICD-10-CM | POA: Diagnosis not present

## 2012-09-09 DIAGNOSIS — H353290 Exudative age-related macular degeneration, unspecified eye, stage unspecified: Secondary | ICD-10-CM | POA: Diagnosis not present

## 2012-09-13 DIAGNOSIS — Z1212 Encounter for screening for malignant neoplasm of rectum: Secondary | ICD-10-CM | POA: Diagnosis not present

## 2012-09-15 ENCOUNTER — Other Ambulatory Visit: Payer: Self-pay | Admitting: Internal Medicine

## 2012-09-15 DIAGNOSIS — Z1331 Encounter for screening for depression: Secondary | ICD-10-CM | POA: Diagnosis not present

## 2012-09-15 DIAGNOSIS — K219 Gastro-esophageal reflux disease without esophagitis: Secondary | ICD-10-CM | POA: Diagnosis not present

## 2012-09-15 DIAGNOSIS — E039 Hypothyroidism, unspecified: Secondary | ICD-10-CM | POA: Diagnosis not present

## 2012-09-15 DIAGNOSIS — Z0000 Encounter for general adult medical examination without abnormal findings: Secondary | ICD-10-CM | POA: Diagnosis not present

## 2012-09-15 DIAGNOSIS — I4891 Unspecified atrial fibrillation: Secondary | ICD-10-CM | POA: Diagnosis not present

## 2012-09-15 DIAGNOSIS — E041 Nontoxic single thyroid nodule: Secondary | ICD-10-CM | POA: Diagnosis not present

## 2012-09-15 DIAGNOSIS — M545 Low back pain, unspecified: Secondary | ICD-10-CM | POA: Diagnosis not present

## 2012-09-15 DIAGNOSIS — E785 Hyperlipidemia, unspecified: Secondary | ICD-10-CM | POA: Diagnosis not present

## 2012-09-15 DIAGNOSIS — D649 Anemia, unspecified: Secondary | ICD-10-CM | POA: Diagnosis not present

## 2012-10-04 ENCOUNTER — Ambulatory Visit (INDEPENDENT_AMBULATORY_CARE_PROVIDER_SITE_OTHER): Payer: Medicare Other | Admitting: *Deleted

## 2012-10-04 DIAGNOSIS — I4891 Unspecified atrial fibrillation: Secondary | ICD-10-CM | POA: Diagnosis not present

## 2012-10-04 DIAGNOSIS — Z7901 Long term (current) use of anticoagulants: Secondary | ICD-10-CM | POA: Diagnosis not present

## 2012-10-27 DIAGNOSIS — H353290 Exudative age-related macular degeneration, unspecified eye, stage unspecified: Secondary | ICD-10-CM | POA: Diagnosis not present

## 2012-10-27 DIAGNOSIS — H35059 Retinal neovascularization, unspecified, unspecified eye: Secondary | ICD-10-CM | POA: Diagnosis not present

## 2012-11-03 DIAGNOSIS — H35059 Retinal neovascularization, unspecified, unspecified eye: Secondary | ICD-10-CM | POA: Diagnosis not present

## 2012-11-03 DIAGNOSIS — H353290 Exudative age-related macular degeneration, unspecified eye, stage unspecified: Secondary | ICD-10-CM | POA: Diagnosis not present

## 2012-11-09 ENCOUNTER — Telehealth: Payer: Self-pay | Admitting: *Deleted

## 2012-11-09 NOTE — Telephone Encounter (Signed)
Pt called asking if could take Tylenol PM with her taking coumadin and instructed  no interaction between Tylenol PM and Coumadin and she states understanding

## 2012-11-15 ENCOUNTER — Ambulatory Visit (INDEPENDENT_AMBULATORY_CARE_PROVIDER_SITE_OTHER): Payer: Medicare Other | Admitting: *Deleted

## 2012-11-15 DIAGNOSIS — I4891 Unspecified atrial fibrillation: Secondary | ICD-10-CM

## 2012-11-15 DIAGNOSIS — Z7901 Long term (current) use of anticoagulants: Secondary | ICD-10-CM

## 2012-11-18 ENCOUNTER — Other Ambulatory Visit: Payer: Self-pay | Admitting: Cardiology

## 2012-12-01 DIAGNOSIS — H353290 Exudative age-related macular degeneration, unspecified eye, stage unspecified: Secondary | ICD-10-CM | POA: Diagnosis not present

## 2012-12-01 DIAGNOSIS — H35359 Cystoid macular degeneration, unspecified eye: Secondary | ICD-10-CM | POA: Diagnosis not present

## 2012-12-08 DIAGNOSIS — H35059 Retinal neovascularization, unspecified, unspecified eye: Secondary | ICD-10-CM | POA: Diagnosis not present

## 2012-12-08 DIAGNOSIS — H353290 Exudative age-related macular degeneration, unspecified eye, stage unspecified: Secondary | ICD-10-CM | POA: Diagnosis not present

## 2012-12-10 DIAGNOSIS — E041 Nontoxic single thyroid nodule: Secondary | ICD-10-CM | POA: Diagnosis not present

## 2012-12-15 ENCOUNTER — Ambulatory Visit (INDEPENDENT_AMBULATORY_CARE_PROVIDER_SITE_OTHER): Payer: Medicare Other | Admitting: *Deleted

## 2012-12-15 DIAGNOSIS — I4891 Unspecified atrial fibrillation: Secondary | ICD-10-CM | POA: Diagnosis not present

## 2012-12-15 DIAGNOSIS — Z7901 Long term (current) use of anticoagulants: Secondary | ICD-10-CM | POA: Diagnosis not present

## 2012-12-16 DIAGNOSIS — L299 Pruritus, unspecified: Secondary | ICD-10-CM | POA: Diagnosis not present

## 2012-12-16 DIAGNOSIS — H6120 Impacted cerumen, unspecified ear: Secondary | ICD-10-CM | POA: Diagnosis not present

## 2012-12-20 DIAGNOSIS — L259 Unspecified contact dermatitis, unspecified cause: Secondary | ICD-10-CM | POA: Diagnosis not present

## 2012-12-21 DIAGNOSIS — L259 Unspecified contact dermatitis, unspecified cause: Secondary | ICD-10-CM | POA: Diagnosis not present

## 2012-12-21 DIAGNOSIS — I781 Nevus, non-neoplastic: Secondary | ICD-10-CM | POA: Diagnosis not present

## 2012-12-21 DIAGNOSIS — D235 Other benign neoplasm of skin of trunk: Secondary | ICD-10-CM | POA: Diagnosis not present

## 2012-12-28 DIAGNOSIS — L259 Unspecified contact dermatitis, unspecified cause: Secondary | ICD-10-CM | POA: Diagnosis not present

## 2013-01-12 DIAGNOSIS — H353290 Exudative age-related macular degeneration, unspecified eye, stage unspecified: Secondary | ICD-10-CM | POA: Diagnosis not present

## 2013-01-12 DIAGNOSIS — H35059 Retinal neovascularization, unspecified, unspecified eye: Secondary | ICD-10-CM | POA: Diagnosis not present

## 2013-01-14 ENCOUNTER — Ambulatory Visit (INDEPENDENT_AMBULATORY_CARE_PROVIDER_SITE_OTHER): Payer: Medicare Other | Admitting: *Deleted

## 2013-01-14 DIAGNOSIS — I4891 Unspecified atrial fibrillation: Secondary | ICD-10-CM | POA: Diagnosis not present

## 2013-01-14 DIAGNOSIS — Z7901 Long term (current) use of anticoagulants: Secondary | ICD-10-CM

## 2013-01-14 LAB — POCT INR: INR: 3.4

## 2013-01-17 ENCOUNTER — Ambulatory Visit
Admission: RE | Admit: 2013-01-17 | Discharge: 2013-01-17 | Disposition: A | Discharge status: Home / Self Care | Payer: Medicare Other | Source: Ambulatory Visit | Attending: Internal Medicine | Admitting: Internal Medicine

## 2013-01-17 DIAGNOSIS — E041 Nontoxic single thyroid nodule: Secondary | ICD-10-CM | POA: Diagnosis not present

## 2013-01-19 DIAGNOSIS — H35059 Retinal neovascularization, unspecified, unspecified eye: Secondary | ICD-10-CM | POA: Diagnosis not present

## 2013-01-19 DIAGNOSIS — H353290 Exudative age-related macular degeneration, unspecified eye, stage unspecified: Secondary | ICD-10-CM | POA: Diagnosis not present

## 2013-01-31 ENCOUNTER — Ambulatory Visit (INDEPENDENT_AMBULATORY_CARE_PROVIDER_SITE_OTHER): Payer: Medicare Other | Admitting: General Practice

## 2013-01-31 DIAGNOSIS — I4891 Unspecified atrial fibrillation: Secondary | ICD-10-CM | POA: Diagnosis not present

## 2013-01-31 DIAGNOSIS — Z7901 Long term (current) use of anticoagulants: Secondary | ICD-10-CM

## 2013-02-04 DIAGNOSIS — Z23 Encounter for immunization: Secondary | ICD-10-CM | POA: Diagnosis not present

## 2013-02-16 DIAGNOSIS — H353290 Exudative age-related macular degeneration, unspecified eye, stage unspecified: Secondary | ICD-10-CM | POA: Diagnosis not present

## 2013-02-16 DIAGNOSIS — H35059 Retinal neovascularization, unspecified, unspecified eye: Secondary | ICD-10-CM | POA: Diagnosis not present

## 2013-02-28 ENCOUNTER — Ambulatory Visit (INDEPENDENT_AMBULATORY_CARE_PROVIDER_SITE_OTHER): Payer: Medicare Other | Admitting: *Deleted

## 2013-02-28 DIAGNOSIS — Z7901 Long term (current) use of anticoagulants: Secondary | ICD-10-CM

## 2013-02-28 DIAGNOSIS — I4891 Unspecified atrial fibrillation: Secondary | ICD-10-CM | POA: Diagnosis not present

## 2013-03-02 DIAGNOSIS — H353290 Exudative age-related macular degeneration, unspecified eye, stage unspecified: Secondary | ICD-10-CM | POA: Diagnosis not present

## 2013-03-02 DIAGNOSIS — H353190 Nonexudative age-related macular degeneration, unspecified eye, stage unspecified: Secondary | ICD-10-CM | POA: Diagnosis not present

## 2013-03-15 ENCOUNTER — Other Ambulatory Visit: Payer: Self-pay | Admitting: Cardiology

## 2013-03-22 ENCOUNTER — Ambulatory Visit (INDEPENDENT_AMBULATORY_CARE_PROVIDER_SITE_OTHER): Payer: Medicare Other | Admitting: Cardiology

## 2013-03-22 ENCOUNTER — Encounter: Payer: Self-pay | Admitting: Cardiology

## 2013-03-22 ENCOUNTER — Ambulatory Visit (INDEPENDENT_AMBULATORY_CARE_PROVIDER_SITE_OTHER): Payer: Medicare Other | Admitting: *Deleted

## 2013-03-22 VITALS — BP 124/76 | HR 76 | Ht 62.0 in | Wt 156.0 lb

## 2013-03-22 DIAGNOSIS — I10 Essential (primary) hypertension: Secondary | ICD-10-CM | POA: Diagnosis not present

## 2013-03-22 DIAGNOSIS — I4891 Unspecified atrial fibrillation: Secondary | ICD-10-CM | POA: Diagnosis not present

## 2013-03-22 DIAGNOSIS — Z7901 Long term (current) use of anticoagulants: Secondary | ICD-10-CM | POA: Diagnosis not present

## 2013-03-22 DIAGNOSIS — I471 Supraventricular tachycardia: Secondary | ICD-10-CM

## 2013-03-22 DIAGNOSIS — E7800 Pure hypercholesterolemia, unspecified: Secondary | ICD-10-CM | POA: Diagnosis not present

## 2013-03-22 LAB — CBC WITH DIFFERENTIAL/PLATELET
Basophils Absolute: 0 10*3/uL (ref 0.0–0.1)
Eosinophils Absolute: 0.2 10*3/uL (ref 0.0–0.7)
Eosinophils Relative: 4.3 % (ref 0.0–5.0)
HCT: 37 % (ref 36.0–46.0)
Hemoglobin: 12.4 g/dL (ref 12.0–15.0)
Lymphs Abs: 1.7 10*3/uL (ref 0.7–4.0)
MCHC: 33.6 g/dL (ref 30.0–36.0)
Monocytes Absolute: 0.6 10*3/uL (ref 0.1–1.0)
Neutro Abs: 3 10*3/uL (ref 1.4–7.7)
Neutrophils Relative %: 53.3 % (ref 43.0–77.0)
RDW: 14.8 % — ABNORMAL HIGH (ref 11.5–14.6)

## 2013-03-22 LAB — BASIC METABOLIC PANEL
Calcium: 9.1 mg/dL (ref 8.4–10.5)
Creatinine, Ser: 1 mg/dL (ref 0.4–1.2)
GFR: 57.64 mL/min — ABNORMAL LOW (ref >60.00)
Potassium: 4 mEq/L (ref 3.5–5.1)
Sodium: 136 mEq/L (ref 135–145)

## 2013-03-22 NOTE — Assessment & Plan Note (Signed)
-   Continue verapamil 

## 2013-03-22 NOTE — Patient Instructions (Signed)
Your physician wants you to follow-up in: ONE YEAR WITH DR CRENSHAW You will receive a reminder letter in the mail two months in advance. If you don't receive a letter, please call our office to schedule the follow-up appointment.   Your physician recommends that you HAVE LAB WORK TODAY 

## 2013-03-22 NOTE — Assessment & Plan Note (Signed)
Plan continue verapamil for rate control. Continue Coumadin. Check hemoglobin.

## 2013-03-22 NOTE — Progress Notes (Signed)
HPI: Sylvia [name] is a pleasant female who has a history of permanent atrial fibrillation and SVT. Her LV function is normal. Last echocardiogram in June of 2011 showed normal LV function. There was trivial aortic and mitral regurgitation. Also note she had a cardiac catheterization in December 2003 that showed a 20% first obtuse marginal but otherwise no obstructive disease. She was referred for atrial fibrillation ablation. She had this procedure in June of 2011. Following the procedure she did have recurrent atrial fibrillation. She was seen by Dr. Johney Frame and consideration of repeat ablation was discussed. However the decision has been made for rate control and anticoagulation. Since I last saw her in Dec of 2013, she has some dyspnea on exertion but no orthopnea, PND, pedal edema, palpitations, syncope, exertional chest pain or bleeding.   Current Outpatient Prescriptions  Medication Sig Dispense Refill  . acetaminophen (TYLENOL) 100 MG/ML solution Take 10 mg/kg by mouth every 4 (four) hours as needed.        . calcium citrate-vitamin D (CITRACAL+D) 315-200 MG-UNIT per tablet Take 1 tablet by mouth daily.        . diphenhydrAMINE (BENADRYL) 25 MG tablet Take 25 mg by mouth at bedtime as needed.      . fish oil-omega-3 fatty acids 1000 MG capsule 2 tab po bid      . fluocinonide (LIDEX) 0.05 % external solution 2 (two) times daily. 1-2 GTTS IN EARS TWICE WEEKLY AS NEEDED       . levothyroxine (SYNTHROID, LEVOTHROID) 75 MCG tablet Take 75 mcg by mouth daily.        Marland Kitchen losartan (COZAAR) 100 MG tablet Take 100 mg by mouth daily.        . Multiple Vitamin (MULTIVITAMIN) tablet Take 1 tablet by mouth daily.        . Multiple Vitamins-Minerals (OCUVITE PRESERVISION PO) Take 1 tablet by mouth 2 (two) times daily.      . polyethylene glycol powder (GLYCOLAX/MIRALAX) powder Take 17 g by mouth daily. PRN        . raloxifene (EVISTA) 60 MG tablet Take 60 mg by mouth daily.        . verapamil  (CALAN-SR) 180 MG CR tablet Take 1 tablet (180 mg total) by mouth 2 (two) times daily.  60 tablet  8  . warfarin (COUMADIN) 5 MG tablet TAKE AS DIRECTED BY MOUTH PER  COUMADIN  CLINIC  35 tablet  3   No current facility-administered medications for this visit.     Past Medical History  Diagnosis Date  . Atrial fibrillation     ATRIAL FIBRILLATION S/P  PVI 6/11  . GERD (gastroesophageal reflux disease)   . Macular degeneration   . Hyperlipidemia   . Hypertension   . SVT (supraventricular tachycardia)     Past Surgical History  Procedure Laterality Date  . Total abdominal hysterectomy    . Ablation of dysrhythmic focus  09/2009    s/p PVI by JA    History   Social History  . Marital Status: Married    Spouse Name: N/A    Number of Children: N/A  . Years of Education: N/A   Occupational History  . Not on file.   Social History Main Topics  . Smoking status: Never Smoker   . Smokeless tobacco: Not on file  . Alcohol Use: No  . Drug Use: No  . Sexual Activity: Not on file   Other Topics Concern  . Not  on file   Social History Narrative   Lives with spouse    ROS: no fevers or chills, productive cough, hemoptysis, dysphasia, odynophagia, melena, hematochezia, dysuria, hematuria, rash, seizure activity, orthopnea, PND, pedal edema, claudication. Remaining systems are negative.  Physical Exam: Well-developed well-nourished in no acute distress.  Skin is warm and dry.  HEENT is normal.  Neck is supple.  Chest is clear to auscultation with normal expansion.  Cardiovascular exam is irregular Abdominal exam nontender or distended. No masses palpated. Extremities show no edema. neuro grossly intact  ECG atrial fibrillation at a rate of 76. Nonspecific ST changes.

## 2013-03-22 NOTE — Assessment & Plan Note (Signed)
Management per primary care. 

## 2013-03-22 NOTE — Assessment & Plan Note (Signed)
Blood pressure controlled. Continue present medications. Check potassium and renal function. 

## 2013-03-30 DIAGNOSIS — H353290 Exudative age-related macular degeneration, unspecified eye, stage unspecified: Secondary | ICD-10-CM | POA: Diagnosis not present

## 2013-03-30 DIAGNOSIS — H35059 Retinal neovascularization, unspecified, unspecified eye: Secondary | ICD-10-CM | POA: Diagnosis not present

## 2013-04-13 DIAGNOSIS — H35059 Retinal neovascularization, unspecified, unspecified eye: Secondary | ICD-10-CM | POA: Diagnosis not present

## 2013-04-13 DIAGNOSIS — H353290 Exudative age-related macular degeneration, unspecified eye, stage unspecified: Secondary | ICD-10-CM | POA: Diagnosis not present

## 2013-04-19 ENCOUNTER — Ambulatory Visit (INDEPENDENT_AMBULATORY_CARE_PROVIDER_SITE_OTHER): Payer: Medicare Other | Admitting: *Deleted

## 2013-04-19 DIAGNOSIS — I4891 Unspecified atrial fibrillation: Secondary | ICD-10-CM

## 2013-04-19 DIAGNOSIS — Z7901 Long term (current) use of anticoagulants: Secondary | ICD-10-CM | POA: Diagnosis not present

## 2013-04-19 LAB — POCT INR: INR: 2

## 2013-05-02 ENCOUNTER — Telehealth: Payer: Self-pay | Admitting: *Deleted

## 2013-05-02 NOTE — Telephone Encounter (Signed)
Pt called stating started on Tamuflu and Zpak today and wanted to be sure ok for her to take with her coumadin and pt informed that these medications will be ok with her coumadin and she states understanding

## 2013-05-14 ENCOUNTER — Other Ambulatory Visit: Payer: Self-pay | Admitting: Cardiology

## 2013-05-16 ENCOUNTER — Other Ambulatory Visit: Payer: Self-pay | Admitting: Cardiology

## 2013-05-18 DIAGNOSIS — H353290 Exudative age-related macular degeneration, unspecified eye, stage unspecified: Secondary | ICD-10-CM | POA: Diagnosis not present

## 2013-05-18 DIAGNOSIS — H35059 Retinal neovascularization, unspecified, unspecified eye: Secondary | ICD-10-CM | POA: Diagnosis not present

## 2013-05-24 ENCOUNTER — Ambulatory Visit (INDEPENDENT_AMBULATORY_CARE_PROVIDER_SITE_OTHER): Payer: Medicare Other

## 2013-05-24 DIAGNOSIS — Z7901 Long term (current) use of anticoagulants: Secondary | ICD-10-CM | POA: Diagnosis not present

## 2013-05-24 DIAGNOSIS — I4891 Unspecified atrial fibrillation: Secondary | ICD-10-CM | POA: Diagnosis not present

## 2013-05-24 DIAGNOSIS — Z5181 Encounter for therapeutic drug level monitoring: Secondary | ICD-10-CM

## 2013-05-24 LAB — POCT INR: INR: 2.7

## 2013-06-01 DIAGNOSIS — H353290 Exudative age-related macular degeneration, unspecified eye, stage unspecified: Secondary | ICD-10-CM | POA: Diagnosis not present

## 2013-06-01 DIAGNOSIS — H35059 Retinal neovascularization, unspecified, unspecified eye: Secondary | ICD-10-CM | POA: Diagnosis not present

## 2013-06-16 DIAGNOSIS — H353290 Exudative age-related macular degeneration, unspecified eye, stage unspecified: Secondary | ICD-10-CM | POA: Diagnosis not present

## 2013-06-16 DIAGNOSIS — Z961 Presence of intraocular lens: Secondary | ICD-10-CM | POA: Diagnosis not present

## 2013-07-05 ENCOUNTER — Ambulatory Visit (INDEPENDENT_AMBULATORY_CARE_PROVIDER_SITE_OTHER): Payer: Medicare Other

## 2013-07-05 DIAGNOSIS — Z7901 Long term (current) use of anticoagulants: Secondary | ICD-10-CM

## 2013-07-05 DIAGNOSIS — Z5181 Encounter for therapeutic drug level monitoring: Secondary | ICD-10-CM | POA: Diagnosis not present

## 2013-07-05 DIAGNOSIS — I4891 Unspecified atrial fibrillation: Secondary | ICD-10-CM

## 2013-07-05 LAB — POCT INR: INR: 2.9

## 2013-07-06 DIAGNOSIS — H35059 Retinal neovascularization, unspecified, unspecified eye: Secondary | ICD-10-CM | POA: Diagnosis not present

## 2013-07-06 DIAGNOSIS — H353290 Exudative age-related macular degeneration, unspecified eye, stage unspecified: Secondary | ICD-10-CM | POA: Diagnosis not present

## 2013-07-16 ENCOUNTER — Other Ambulatory Visit: Payer: Self-pay | Admitting: Cardiology

## 2013-07-27 DIAGNOSIS — H353290 Exudative age-related macular degeneration, unspecified eye, stage unspecified: Secondary | ICD-10-CM | POA: Diagnosis not present

## 2013-07-27 DIAGNOSIS — H35059 Retinal neovascularization, unspecified, unspecified eye: Secondary | ICD-10-CM | POA: Diagnosis not present

## 2013-08-16 ENCOUNTER — Ambulatory Visit (INDEPENDENT_AMBULATORY_CARE_PROVIDER_SITE_OTHER): Payer: Medicare Other | Admitting: *Deleted

## 2013-08-16 DIAGNOSIS — Z7901 Long term (current) use of anticoagulants: Secondary | ICD-10-CM

## 2013-08-16 DIAGNOSIS — Z5181 Encounter for therapeutic drug level monitoring: Secondary | ICD-10-CM

## 2013-08-16 DIAGNOSIS — I4891 Unspecified atrial fibrillation: Secondary | ICD-10-CM | POA: Diagnosis not present

## 2013-08-16 LAB — POCT INR: INR: 2

## 2013-08-30 DIAGNOSIS — H35059 Retinal neovascularization, unspecified, unspecified eye: Secondary | ICD-10-CM | POA: Diagnosis not present

## 2013-08-30 DIAGNOSIS — H353290 Exudative age-related macular degeneration, unspecified eye, stage unspecified: Secondary | ICD-10-CM | POA: Diagnosis not present

## 2013-09-01 DIAGNOSIS — H353290 Exudative age-related macular degeneration, unspecified eye, stage unspecified: Secondary | ICD-10-CM | POA: Diagnosis not present

## 2013-09-01 DIAGNOSIS — H35059 Retinal neovascularization, unspecified, unspecified eye: Secondary | ICD-10-CM | POA: Diagnosis not present

## 2013-09-26 DIAGNOSIS — I10 Essential (primary) hypertension: Secondary | ICD-10-CM | POA: Diagnosis not present

## 2013-09-26 DIAGNOSIS — M949 Disorder of cartilage, unspecified: Secondary | ICD-10-CM | POA: Diagnosis not present

## 2013-09-26 DIAGNOSIS — M899 Disorder of bone, unspecified: Secondary | ICD-10-CM | POA: Diagnosis not present

## 2013-09-26 DIAGNOSIS — E039 Hypothyroidism, unspecified: Secondary | ICD-10-CM | POA: Diagnosis not present

## 2013-09-26 DIAGNOSIS — R809 Proteinuria, unspecified: Secondary | ICD-10-CM | POA: Diagnosis not present

## 2013-09-26 DIAGNOSIS — E785 Hyperlipidemia, unspecified: Secondary | ICD-10-CM | POA: Diagnosis not present

## 2013-09-27 ENCOUNTER — Ambulatory Visit (INDEPENDENT_AMBULATORY_CARE_PROVIDER_SITE_OTHER): Payer: Medicare Other

## 2013-09-27 DIAGNOSIS — Z7901 Long term (current) use of anticoagulants: Secondary | ICD-10-CM | POA: Diagnosis not present

## 2013-09-27 DIAGNOSIS — Z5181 Encounter for therapeutic drug level monitoring: Secondary | ICD-10-CM

## 2013-09-27 DIAGNOSIS — I4891 Unspecified atrial fibrillation: Secondary | ICD-10-CM | POA: Diagnosis not present

## 2013-09-27 DIAGNOSIS — Z1212 Encounter for screening for malignant neoplasm of rectum: Secondary | ICD-10-CM | POA: Diagnosis not present

## 2013-09-27 LAB — POCT INR: INR: 2.4

## 2013-10-03 DIAGNOSIS — M79609 Pain in unspecified limb: Secondary | ICD-10-CM | POA: Diagnosis not present

## 2013-10-03 DIAGNOSIS — E785 Hyperlipidemia, unspecified: Secondary | ICD-10-CM | POA: Diagnosis not present

## 2013-10-03 DIAGNOSIS — F411 Generalized anxiety disorder: Secondary | ICD-10-CM | POA: Diagnosis not present

## 2013-10-03 DIAGNOSIS — I4891 Unspecified atrial fibrillation: Secondary | ICD-10-CM | POA: Diagnosis not present

## 2013-10-03 DIAGNOSIS — Z1331 Encounter for screening for depression: Secondary | ICD-10-CM | POA: Diagnosis not present

## 2013-10-03 DIAGNOSIS — I10 Essential (primary) hypertension: Secondary | ICD-10-CM | POA: Diagnosis not present

## 2013-10-03 DIAGNOSIS — Z0000 Encounter for general adult medical examination without abnormal findings: Secondary | ICD-10-CM | POA: Diagnosis not present

## 2013-10-03 DIAGNOSIS — E039 Hypothyroidism, unspecified: Secondary | ICD-10-CM | POA: Diagnosis not present

## 2013-10-03 DIAGNOSIS — Z79899 Other long term (current) drug therapy: Secondary | ICD-10-CM | POA: Diagnosis not present

## 2013-10-05 DIAGNOSIS — H35059 Retinal neovascularization, unspecified, unspecified eye: Secondary | ICD-10-CM | POA: Diagnosis not present

## 2013-10-05 DIAGNOSIS — H353290 Exudative age-related macular degeneration, unspecified eye, stage unspecified: Secondary | ICD-10-CM | POA: Diagnosis not present

## 2013-10-11 DIAGNOSIS — M899 Disorder of bone, unspecified: Secondary | ICD-10-CM | POA: Diagnosis not present

## 2013-10-11 DIAGNOSIS — M949 Disorder of cartilage, unspecified: Secondary | ICD-10-CM | POA: Diagnosis not present

## 2013-10-19 DIAGNOSIS — H35059 Retinal neovascularization, unspecified, unspecified eye: Secondary | ICD-10-CM | POA: Diagnosis not present

## 2013-10-19 DIAGNOSIS — H353290 Exudative age-related macular degeneration, unspecified eye, stage unspecified: Secondary | ICD-10-CM | POA: Diagnosis not present

## 2013-11-08 ENCOUNTER — Ambulatory Visit (INDEPENDENT_AMBULATORY_CARE_PROVIDER_SITE_OTHER): Payer: Medicare Other | Admitting: *Deleted

## 2013-11-08 DIAGNOSIS — Z7901 Long term (current) use of anticoagulants: Secondary | ICD-10-CM

## 2013-11-08 DIAGNOSIS — I4891 Unspecified atrial fibrillation: Secondary | ICD-10-CM

## 2013-11-08 DIAGNOSIS — Z5181 Encounter for therapeutic drug level monitoring: Secondary | ICD-10-CM | POA: Diagnosis not present

## 2013-11-08 LAB — POCT INR: INR: 2.4

## 2013-11-16 DIAGNOSIS — H353290 Exudative age-related macular degeneration, unspecified eye, stage unspecified: Secondary | ICD-10-CM | POA: Diagnosis not present

## 2013-11-16 DIAGNOSIS — H35059 Retinal neovascularization, unspecified, unspecified eye: Secondary | ICD-10-CM | POA: Diagnosis not present

## 2013-12-14 DIAGNOSIS — H353290 Exudative age-related macular degeneration, unspecified eye, stage unspecified: Secondary | ICD-10-CM | POA: Diagnosis not present

## 2013-12-14 DIAGNOSIS — H35059 Retinal neovascularization, unspecified, unspecified eye: Secondary | ICD-10-CM | POA: Diagnosis not present

## 2013-12-14 DIAGNOSIS — H35359 Cystoid macular degeneration, unspecified eye: Secondary | ICD-10-CM | POA: Diagnosis not present

## 2013-12-16 ENCOUNTER — Telehealth: Payer: Self-pay | Admitting: Cardiology

## 2013-12-16 MED ORDER — WARFARIN SODIUM 5 MG PO TABS: ORAL_TABLET | ORAL | Status: DC

## 2013-12-16 NOTE — Telephone Encounter (Signed)
Pt need her Warfarin this week-end. Would you please call this to [facility]-[phone] today.

## 2013-12-20 ENCOUNTER — Ambulatory Visit (INDEPENDENT_AMBULATORY_CARE_PROVIDER_SITE_OTHER): Payer: Medicare Other | Admitting: *Deleted

## 2013-12-20 DIAGNOSIS — Z7901 Long term (current) use of anticoagulants: Secondary | ICD-10-CM

## 2013-12-20 DIAGNOSIS — Z5181 Encounter for therapeutic drug level monitoring: Secondary | ICD-10-CM | POA: Diagnosis not present

## 2013-12-20 DIAGNOSIS — I4891 Unspecified atrial fibrillation: Secondary | ICD-10-CM

## 2013-12-20 LAB — POCT INR: INR: 2.1

## 2014-01-11 DIAGNOSIS — H35059 Retinal neovascularization, unspecified, unspecified eye: Secondary | ICD-10-CM | POA: Diagnosis not present

## 2014-01-11 DIAGNOSIS — H353290 Exudative age-related macular degeneration, unspecified eye, stage unspecified: Secondary | ICD-10-CM | POA: Diagnosis not present

## 2014-01-17 DIAGNOSIS — Z23 Encounter for immunization: Secondary | ICD-10-CM | POA: Diagnosis not present

## 2014-01-31 ENCOUNTER — Ambulatory Visit (INDEPENDENT_AMBULATORY_CARE_PROVIDER_SITE_OTHER): Payer: Medicare Other

## 2014-01-31 DIAGNOSIS — Z5181 Encounter for therapeutic drug level monitoring: Secondary | ICD-10-CM

## 2014-01-31 DIAGNOSIS — Z7901 Long term (current) use of anticoagulants: Secondary | ICD-10-CM | POA: Diagnosis not present

## 2014-01-31 DIAGNOSIS — I4891 Unspecified atrial fibrillation: Secondary | ICD-10-CM

## 2014-01-31 LAB — POCT INR: INR: 2.7

## 2014-02-15 DIAGNOSIS — H3532 Exudative age-related macular degeneration: Secondary | ICD-10-CM | POA: Diagnosis not present

## 2014-02-20 DIAGNOSIS — H3532 Exudative age-related macular degeneration: Secondary | ICD-10-CM | POA: Diagnosis not present

## 2014-02-20 DIAGNOSIS — H35051 Retinal neovascularization, unspecified, right eye: Secondary | ICD-10-CM | POA: Diagnosis not present

## 2014-03-22 NOTE — Progress Notes (Signed)
HPI: [name] is a pleasant female who has a history of permanent atrial fibrillation and SVT. Her LV function is normal. Last echocardiogram in June of 2011 showed normal LV function. There was trivial aortic and mitral regurgitation. Also note she had a cardiac catheterization in December 2003 that showed a 20% first obtuse marginal but otherwise no obstructive disease. She was referred for atrial fibrillation ablation. She had this procedure in June of 2011. Following the procedure she did have recurrent atrial fibrillation. She was seen by Dr. Rayann Heman and consideration of repeat ablation was discussed. However the decision has been made for rate control and anticoagulation. Since I last saw her in Dec of 2014, she has some dyspnea on exertion but no orthopnea, PND, pedal edema, palpitations, syncope, exertional chest pain or bleeding. Complains of fatigue.   Current Outpatient Prescriptions  Medication Sig Dispense Refill  . calcium citrate-vitamin D (CITRACAL+D) 315-200 MG-UNIT per tablet Take 1 tablet by mouth daily.      . diphenhydrAMINE (BENADRYL) 25 MG tablet Take 25 mg by mouth at bedtime as needed.    . fish oil-omega-3 fatty acids 1000 MG capsule 2 tab po bid    . fluocinonide (LIDEX) 0.05 % external solution 2 (two) times daily. 1-2 GTTS IN EARS TWICE WEEKLY AS NEEDED     . levothyroxine (SYNTHROID, LEVOTHROID) 75 MCG tablet Take 75 mcg by mouth daily.      Marland Kitchen losartan (COZAAR) 100 MG tablet Take 100 mg by mouth daily.      . Multiple Vitamin (MULTIVITAMIN) tablet Take 1 tablet by mouth daily.      . Multiple Vitamins-Minerals (OCUVITE PRESERVISION PO) Take 1 tablet by mouth 2 (two) times daily.    . pantoprazole (PROTONIX) 40 MG tablet Take 40 mg by mouth 2 (two) times daily.    . polyethylene glycol powder (GLYCOLAX/MIRALAX) powder Take 17 g by mouth daily. PRN      . raloxifene (EVISTA) 60 MG tablet Take 60 mg by mouth daily.      . verapamil (CALAN-SR) 180 MG CR tablet  TAKE ONE TABLET BY MOUTH TWICE DAILY 60 tablet 6  . warfarin (COUMADIN) 5 MG tablet 1 tablet everyday or as directed by coumadin clinic 35 tablet 3   No current facility-administered medications for this visit.     Past Medical History  Diagnosis Date  . Atrial fibrillation     ATRIAL FIBRILLATION S/P  PVI 6/11  . GERD (gastroesophageal reflux disease)   . Macular degeneration   . Hyperlipidemia   . Hypertension   . SVT (supraventricular tachycardia)     Past Surgical History  Procedure Laterality Date  . Total abdominal hysterectomy    . Ablation of dysrhythmic focus  09/2009    s/p PVI by JA    History   Social History  . Marital Status: Married    Spouse Name: N/A    Number of Children: N/A  . Years of Education: N/A   Occupational History  . Not on file.   Social History Main Topics  . Smoking status: Never Smoker   . Smokeless tobacco: Not on file  . Alcohol Use: No  . Drug Use: No  . Sexual Activity: Not on file   Other Topics Concern  . Not on file   Social History Narrative   Lives with spouse    ROS: no fevers or chills, productive cough, hemoptysis, dysphasia, odynophagia, melena, hematochezia, dysuria, hematuria, rash, seizure activity, orthopnea,  PND, pedal edema, claudication. Remaining systems are negative.  Physical Exam: Well-developed well-nourished in no acute distress.  Skin is warm and dry.  HEENT is normal.  Neck is supple.  Chest is clear to auscultation with normal expansion.  Cardiovascular exam is irregular Abdominal exam nontender or distended. No masses palpated. Extremities show no edema. neuro grossly intact  ECG atrial fibrillation at a rate of 100. Nonspecific ST changes.

## 2014-03-24 ENCOUNTER — Ambulatory Visit (INDEPENDENT_AMBULATORY_CARE_PROVIDER_SITE_OTHER): Payer: Medicare Other | Admitting: Cardiology

## 2014-03-24 ENCOUNTER — Ambulatory Visit (INDEPENDENT_AMBULATORY_CARE_PROVIDER_SITE_OTHER): Payer: Medicare Other | Admitting: Pharmacist Clinician (PhC)/ Clinical Pharmacy Specialist

## 2014-03-24 ENCOUNTER — Encounter: Payer: Self-pay | Admitting: Cardiology

## 2014-03-24 DIAGNOSIS — I4891 Unspecified atrial fibrillation: Secondary | ICD-10-CM

## 2014-03-24 DIAGNOSIS — Z7901 Long term (current) use of anticoagulants: Secondary | ICD-10-CM

## 2014-03-24 DIAGNOSIS — Z5181 Encounter for therapeutic drug level monitoring: Secondary | ICD-10-CM

## 2014-03-24 LAB — CBC
HCT: 39.2 % (ref 36.0–46.0)
Hemoglobin: 13.2 g/dL (ref 12.0–15.0)
MCH: 30.8 pg (ref 26.0–34.0)
MCHC: 33.7 g/dL (ref 30.0–36.0)
MCV: 91.6 fL (ref 78.0–100.0)
MPV: 9.4 fL (ref 9.4–12.4)
PLATELETS: 227 10*3/uL (ref 150–400)
RBC: 4.28 MIL/uL (ref 3.87–5.11)
RDW: 15.2 % (ref 11.5–15.5)
WBC: 4.5 10*3/uL (ref 4.0–10.5)

## 2014-03-24 LAB — BASIC METABOLIC PANEL WITH GFR
BUN: 18 mg/dL (ref 6–23)
CHLORIDE: 107 meq/L (ref 96–112)
CO2: 22 meq/L (ref 19–32)
Calcium: 8.6 mg/dL (ref 8.4–10.5)
Creat: 0.98 mg/dL (ref 0.50–1.10)
GFR, Est African American: 63 mL/min
GFR, Est Non African American: 55 mL/min — ABNORMAL LOW
Glucose, Bld: 84 mg/dL (ref 70–99)
Potassium: 4.4 mEq/L (ref 3.5–5.3)
Sodium: 141 mEq/L (ref 135–145)

## 2014-03-24 LAB — TSH: TSH: 0.611 u[IU]/mL (ref 0.350–4.500)

## 2014-03-24 LAB — POCT INR: INR: 3.5

## 2014-03-24 NOTE — Assessment & Plan Note (Signed)
Blood pressure controlled. Continue present medications. Check potassium and renal function. 

## 2014-03-24 NOTE — Assessment & Plan Note (Signed)
Patient remains in permanent atrial fibrillation. Continue Coumadin. Check hemoglobin. I have given her the name of apixaban to see if her insurance covers. Continue verapamil for rate control. Check 24-hour Holter monitor to make sure that rate is controlled.

## 2014-03-24 NOTE — Assessment & Plan Note (Signed)
Check TSH 

## 2014-03-24 NOTE — Assessment & Plan Note (Signed)
- 

## 2014-03-24 NOTE — Patient Instructions (Signed)
Your physician wants you to follow-up in: [place] will receive a reminder letter in the mail two months in advance. If you don't receive a letter, please call our office to schedule the follow-up appointment.   Your physician has recommended that you wear a 24 HOUR holter monitor. Holter monitors are medical devices that record the heart's electrical activity. Doctors most often use these monitors to diagnose arrhythmias. Arrhythmias are problems with the speed or rhythm of the heartbeat. The monitor is a small, portable device. You can wear one while you do your normal daily activities. This is usually used to diagnose what is causing palpitations/syncope (passing out).SCHEDULE WITH NEXT COUMADIN CHECK AT [facility]  Your physician recommends that you HAVE LAB WORK TODAY  ELIQUIS TO REPLACE WARFARIN

## 2014-04-10 DIAGNOSIS — H35051 Retinal neovascularization, unspecified, right eye: Secondary | ICD-10-CM | POA: Diagnosis not present

## 2014-04-10 DIAGNOSIS — H3532 Exudative age-related macular degeneration: Secondary | ICD-10-CM | POA: Diagnosis not present

## 2014-04-13 ENCOUNTER — Ambulatory Visit (INDEPENDENT_AMBULATORY_CARE_PROVIDER_SITE_OTHER): Payer: Medicare Other | Admitting: *Deleted

## 2014-04-13 ENCOUNTER — Encounter: Payer: Self-pay | Admitting: *Deleted

## 2014-04-13 ENCOUNTER — Encounter (INDEPENDENT_AMBULATORY_CARE_PROVIDER_SITE_OTHER): Payer: Medicare Other

## 2014-04-13 DIAGNOSIS — Z7901 Long term (current) use of anticoagulants: Secondary | ICD-10-CM

## 2014-04-13 DIAGNOSIS — I4891 Unspecified atrial fibrillation: Secondary | ICD-10-CM

## 2014-04-13 DIAGNOSIS — Z5181 Encounter for therapeutic drug level monitoring: Secondary | ICD-10-CM | POA: Diagnosis not present

## 2014-04-13 LAB — POCT INR: INR: 3.3

## 2014-04-13 NOTE — Progress Notes (Signed)
Patient ID: Sylvia [name], female   DOB: [birth year], 80 y.o.   MRN: [id number] [employer] 24 hour holter monitor applied to patient.

## 2014-04-17 ENCOUNTER — Other Ambulatory Visit: Payer: Self-pay | Admitting: Cardiology

## 2014-04-18 DIAGNOSIS — H3532 Exudative age-related macular degeneration: Secondary | ICD-10-CM | POA: Diagnosis not present

## 2014-04-18 DIAGNOSIS — H35052 Retinal neovascularization, unspecified, left eye: Secondary | ICD-10-CM | POA: Diagnosis not present

## 2014-04-21 ENCOUNTER — Telehealth: Payer: Self-pay | Admitting: *Deleted

## 2014-04-21 NOTE — Telephone Encounter (Signed)
Monitor reviewed by dr Stanford Breed shows atrial fib with controlled rate.  pt aware of results

## 2014-04-27 ENCOUNTER — Ambulatory Visit (INDEPENDENT_AMBULATORY_CARE_PROVIDER_SITE_OTHER): Payer: Medicare Other | Admitting: *Deleted

## 2014-04-27 DIAGNOSIS — Z7901 Long term (current) use of anticoagulants: Secondary | ICD-10-CM | POA: Diagnosis not present

## 2014-04-27 DIAGNOSIS — Z5181 Encounter for therapeutic drug level monitoring: Secondary | ICD-10-CM

## 2014-04-27 DIAGNOSIS — I4891 Unspecified atrial fibrillation: Secondary | ICD-10-CM

## 2014-04-27 LAB — POCT INR: INR: 1.9

## 2014-05-15 ENCOUNTER — Ambulatory Visit (INDEPENDENT_AMBULATORY_CARE_PROVIDER_SITE_OTHER): Payer: Medicare Other

## 2014-05-15 DIAGNOSIS — I10 Essential (primary) hypertension: Secondary | ICD-10-CM | POA: Diagnosis not present

## 2014-05-15 DIAGNOSIS — I4891 Unspecified atrial fibrillation: Secondary | ICD-10-CM | POA: Diagnosis not present

## 2014-05-15 DIAGNOSIS — Z7901 Long term (current) use of anticoagulants: Secondary | ICD-10-CM | POA: Diagnosis not present

## 2014-05-15 DIAGNOSIS — R21 Rash and other nonspecific skin eruption: Secondary | ICD-10-CM | POA: Diagnosis not present

## 2014-05-15 DIAGNOSIS — Z6828 Body mass index (BMI) 28.0-28.9, adult: Secondary | ICD-10-CM | POA: Diagnosis not present

## 2014-05-15 DIAGNOSIS — Z5181 Encounter for therapeutic drug level monitoring: Secondary | ICD-10-CM

## 2014-05-15 LAB — POCT INR: INR: 2.2

## 2014-05-24 DIAGNOSIS — L309 Dermatitis, unspecified: Secondary | ICD-10-CM | POA: Diagnosis not present

## 2014-05-24 DIAGNOSIS — L820 Inflamed seborrheic keratosis: Secondary | ICD-10-CM | POA: Diagnosis not present

## 2014-05-30 DIAGNOSIS — H35051 Retinal neovascularization, unspecified, right eye: Secondary | ICD-10-CM | POA: Diagnosis not present

## 2014-05-30 DIAGNOSIS — H3532 Exudative age-related macular degeneration: Secondary | ICD-10-CM | POA: Diagnosis not present

## 2014-06-12 ENCOUNTER — Ambulatory Visit (INDEPENDENT_AMBULATORY_CARE_PROVIDER_SITE_OTHER): Payer: Medicare Other | Admitting: *Deleted

## 2014-06-12 DIAGNOSIS — Z5181 Encounter for therapeutic drug level monitoring: Secondary | ICD-10-CM

## 2014-06-12 DIAGNOSIS — Z7901 Long term (current) use of anticoagulants: Secondary | ICD-10-CM

## 2014-06-12 DIAGNOSIS — I4891 Unspecified atrial fibrillation: Secondary | ICD-10-CM | POA: Diagnosis not present

## 2014-06-12 LAB — POCT INR: INR: 1.9

## 2014-06-16 ENCOUNTER — Other Ambulatory Visit: Payer: Self-pay

## 2014-06-16 ENCOUNTER — Telehealth: Payer: Self-pay | Admitting: Cardiology

## 2014-06-16 MED ORDER — VERAPAMIL HCL ER 180 MG PO TBCR: 180.0000 mg | EXTENDED_RELEASE_TABLET | Freq: Two times a day (BID) | ORAL | Status: DC

## 2014-06-16 NOTE — Telephone Encounter (Signed)
°  1. Which medications need to be refilled? Verapamil  2. Which pharmacy is medication to be sent to?[facility]-[phone]  3. Do they need a 30 day or 90 day supply? #60 and refills  4. Would they like a call back once the medication has been sent to the pharmacy? yes

## 2014-06-20 DIAGNOSIS — H35052 Retinal neovascularization, unspecified, left eye: Secondary | ICD-10-CM | POA: Diagnosis not present

## 2014-06-20 DIAGNOSIS — H3532 Exudative age-related macular degeneration: Secondary | ICD-10-CM | POA: Diagnosis not present

## 2014-06-22 ENCOUNTER — Telehealth: Payer: Self-pay | Admitting: Cardiology

## 2014-06-22 NOTE — Telephone Encounter (Signed)
Returned call to pt's husband advised Pepto contains ASA products which can increase bleeding risks.  Advised ok to take Tums or Mylanta. Understanding verbalized.

## 2014-06-22 NOTE — Telephone Encounter (Signed)
New message      Pt is on coumadin.  Can she take pepto bismol for her stomach?

## 2014-07-03 ENCOUNTER — Ambulatory Visit (INDEPENDENT_AMBULATORY_CARE_PROVIDER_SITE_OTHER): Payer: Medicare Other | Admitting: *Deleted

## 2014-07-03 DIAGNOSIS — I4891 Unspecified atrial fibrillation: Secondary | ICD-10-CM

## 2014-07-03 DIAGNOSIS — Z7901 Long term (current) use of anticoagulants: Secondary | ICD-10-CM

## 2014-07-03 DIAGNOSIS — Z5181 Encounter for therapeutic drug level monitoring: Secondary | ICD-10-CM | POA: Diagnosis not present

## 2014-07-03 LAB — POCT INR: INR: 2.5

## 2014-07-31 ENCOUNTER — Ambulatory Visit (INDEPENDENT_AMBULATORY_CARE_PROVIDER_SITE_OTHER): Payer: Medicare Other | Admitting: *Deleted

## 2014-07-31 DIAGNOSIS — Z5181 Encounter for therapeutic drug level monitoring: Secondary | ICD-10-CM | POA: Diagnosis not present

## 2014-07-31 DIAGNOSIS — I4891 Unspecified atrial fibrillation: Secondary | ICD-10-CM | POA: Diagnosis not present

## 2014-07-31 DIAGNOSIS — Z7901 Long term (current) use of anticoagulants: Secondary | ICD-10-CM | POA: Diagnosis not present

## 2014-07-31 LAB — POCT INR: INR: 2.2

## 2014-08-14 ENCOUNTER — Other Ambulatory Visit: Payer: Self-pay

## 2014-08-14 MED ORDER — WARFARIN SODIUM 5 MG PO TABS: ORAL_TABLET | ORAL | Status: DC

## 2014-08-22 DIAGNOSIS — H3532 Exudative age-related macular degeneration: Secondary | ICD-10-CM | POA: Diagnosis not present

## 2014-08-23 DIAGNOSIS — G4700 Insomnia, unspecified: Secondary | ICD-10-CM | POA: Diagnosis not present

## 2014-08-23 DIAGNOSIS — E039 Hypothyroidism, unspecified: Secondary | ICD-10-CM | POA: Diagnosis not present

## 2014-08-23 DIAGNOSIS — R5383 Other fatigue: Secondary | ICD-10-CM | POA: Diagnosis not present

## 2014-08-23 DIAGNOSIS — Z6828 Body mass index (BMI) 28.0-28.9, adult: Secondary | ICD-10-CM | POA: Diagnosis not present

## 2014-08-23 DIAGNOSIS — R6883 Chills (without fever): Secondary | ICD-10-CM | POA: Diagnosis not present

## 2014-08-28 ENCOUNTER — Ambulatory Visit (INDEPENDENT_AMBULATORY_CARE_PROVIDER_SITE_OTHER): Payer: Medicare Other | Admitting: *Deleted

## 2014-08-28 DIAGNOSIS — I4891 Unspecified atrial fibrillation: Secondary | ICD-10-CM | POA: Diagnosis not present

## 2014-08-28 DIAGNOSIS — Z5181 Encounter for therapeutic drug level monitoring: Secondary | ICD-10-CM | POA: Diagnosis not present

## 2014-08-28 DIAGNOSIS — Z7901 Long term (current) use of anticoagulants: Secondary | ICD-10-CM

## 2014-08-28 LAB — POCT INR: INR: 2.7

## 2014-08-31 DIAGNOSIS — H3532 Exudative age-related macular degeneration: Secondary | ICD-10-CM | POA: Diagnosis not present

## 2014-10-03 ENCOUNTER — Telehealth: Payer: Self-pay | Admitting: Cardiology

## 2014-10-03 NOTE — Telephone Encounter (Signed)
New Message  Pt called states that she is taking an antibiotic request a call back to discuss

## 2014-10-03 NOTE — Telephone Encounter (Signed)
Returned call to pt, pt states she has a UTI and started taking Cipro 250mg  BID x 5 days on 10/02/14.  Primary MD wanted pt to have INR checked there in 2 days after starting abx.  Made pt appt to recheck INR on 10/05/14 at our office, cancelled regularly scheduled f/u on 10/09/14.

## 2014-10-04 DIAGNOSIS — M859 Disorder of bone density and structure, unspecified: Secondary | ICD-10-CM | POA: Diagnosis not present

## 2014-10-04 DIAGNOSIS — I10 Essential (primary) hypertension: Secondary | ICD-10-CM | POA: Diagnosis not present

## 2014-10-04 DIAGNOSIS — E785 Hyperlipidemia, unspecified: Secondary | ICD-10-CM | POA: Diagnosis not present

## 2014-10-04 DIAGNOSIS — E039 Hypothyroidism, unspecified: Secondary | ICD-10-CM | POA: Diagnosis not present

## 2014-10-05 ENCOUNTER — Ambulatory Visit (INDEPENDENT_AMBULATORY_CARE_PROVIDER_SITE_OTHER): Payer: Medicare Other | Admitting: Pharmacist

## 2014-10-05 DIAGNOSIS — I4891 Unspecified atrial fibrillation: Secondary | ICD-10-CM | POA: Diagnosis not present

## 2014-10-05 DIAGNOSIS — Z5181 Encounter for therapeutic drug level monitoring: Secondary | ICD-10-CM | POA: Diagnosis not present

## 2014-10-05 DIAGNOSIS — Z7901 Long term (current) use of anticoagulants: Secondary | ICD-10-CM | POA: Diagnosis not present

## 2014-10-05 DIAGNOSIS — H3532 Exudative age-related macular degeneration: Secondary | ICD-10-CM | POA: Diagnosis not present

## 2014-10-05 LAB — POCT INR: INR: 3

## 2014-10-09 DIAGNOSIS — Z1212 Encounter for screening for malignant neoplasm of rectum: Secondary | ICD-10-CM | POA: Diagnosis not present

## 2014-10-11 DIAGNOSIS — R21 Rash and other nonspecific skin eruption: Secondary | ICD-10-CM | POA: Diagnosis not present

## 2014-10-11 DIAGNOSIS — E039 Hypothyroidism, unspecified: Secondary | ICD-10-CM | POA: Diagnosis not present

## 2014-10-11 DIAGNOSIS — Z0000 Encounter for general adult medical examination without abnormal findings: Secondary | ICD-10-CM | POA: Diagnosis not present

## 2014-10-11 DIAGNOSIS — R809 Proteinuria, unspecified: Secondary | ICD-10-CM | POA: Diagnosis not present

## 2014-10-11 DIAGNOSIS — G4700 Insomnia, unspecified: Secondary | ICD-10-CM | POA: Diagnosis not present

## 2014-10-11 DIAGNOSIS — I480 Paroxysmal atrial fibrillation: Secondary | ICD-10-CM | POA: Diagnosis not present

## 2014-10-11 DIAGNOSIS — D649 Anemia, unspecified: Secondary | ICD-10-CM | POA: Diagnosis not present

## 2014-10-11 DIAGNOSIS — Z1389 Encounter for screening for other disorder: Secondary | ICD-10-CM | POA: Diagnosis not present

## 2014-10-11 DIAGNOSIS — Z23 Encounter for immunization: Secondary | ICD-10-CM | POA: Diagnosis not present

## 2014-10-11 DIAGNOSIS — M545 Low back pain: Secondary | ICD-10-CM | POA: Diagnosis not present

## 2014-10-11 DIAGNOSIS — E785 Hyperlipidemia, unspecified: Secondary | ICD-10-CM | POA: Diagnosis not present

## 2014-10-11 DIAGNOSIS — Z6828 Body mass index (BMI) 28.0-28.9, adult: Secondary | ICD-10-CM | POA: Diagnosis not present

## 2014-10-31 DIAGNOSIS — H3532 Exudative age-related macular degeneration: Secondary | ICD-10-CM | POA: Diagnosis not present

## 2014-11-13 ENCOUNTER — Other Ambulatory Visit: Payer: Self-pay | Admitting: *Deleted

## 2014-11-13 MED ORDER — WARFARIN SODIUM 5 MG PO TABS: ORAL_TABLET | ORAL | Status: DC

## 2014-11-14 DIAGNOSIS — H3532 Exudative age-related macular degeneration: Secondary | ICD-10-CM | POA: Diagnosis not present

## 2014-11-16 ENCOUNTER — Ambulatory Visit (INDEPENDENT_AMBULATORY_CARE_PROVIDER_SITE_OTHER): Payer: Medicare Other | Admitting: *Deleted

## 2014-11-16 DIAGNOSIS — Z5181 Encounter for therapeutic drug level monitoring: Secondary | ICD-10-CM | POA: Diagnosis not present

## 2014-11-16 DIAGNOSIS — I4891 Unspecified atrial fibrillation: Secondary | ICD-10-CM | POA: Diagnosis not present

## 2014-11-16 DIAGNOSIS — Z7901 Long term (current) use of anticoagulants: Secondary | ICD-10-CM

## 2014-11-16 LAB — POCT INR: INR: 2.7

## 2014-12-26 DIAGNOSIS — H3532 Exudative age-related macular degeneration: Secondary | ICD-10-CM | POA: Diagnosis not present

## 2014-12-28 ENCOUNTER — Ambulatory Visit (INDEPENDENT_AMBULATORY_CARE_PROVIDER_SITE_OTHER): Payer: Medicare Other

## 2014-12-28 DIAGNOSIS — I4891 Unspecified atrial fibrillation: Secondary | ICD-10-CM | POA: Diagnosis not present

## 2014-12-28 DIAGNOSIS — Z7901 Long term (current) use of anticoagulants: Secondary | ICD-10-CM | POA: Diagnosis not present

## 2014-12-28 DIAGNOSIS — Z5181 Encounter for therapeutic drug level monitoring: Secondary | ICD-10-CM | POA: Diagnosis not present

## 2014-12-28 LAB — POCT INR: INR: 2.3

## 2015-01-09 ENCOUNTER — Other Ambulatory Visit: Payer: Self-pay | Admitting: Cardiology

## 2015-01-16 ENCOUNTER — Telehealth: Payer: Self-pay | Admitting: Pharmacist

## 2015-01-16 NOTE — Telephone Encounter (Signed)
Spoke with patient's husband - advised that kaopectate will not interact with Coumadin. Advised that if diarrhea continues to go to PCP to check for bacterial source as well as to call us back for potential earlier INR check since prolonged diarrhea may increase INR.

## 2015-01-16 NOTE — Telephone Encounter (Signed)
New Message  Pt husband calling to speak w/ Coumadin- pt having stomach virus and was prescribed kaopectate and wanted to see if it would interfere w/ coumadin. Please call back and discuss.

## 2015-01-19 ENCOUNTER — Ambulatory Visit (INDEPENDENT_AMBULATORY_CARE_PROVIDER_SITE_OTHER): Payer: Medicare Other | Admitting: Cardiology

## 2015-01-19 DIAGNOSIS — Z5181 Encounter for therapeutic drug level monitoring: Secondary | ICD-10-CM

## 2015-01-19 DIAGNOSIS — I4891 Unspecified atrial fibrillation: Secondary | ICD-10-CM

## 2015-01-19 DIAGNOSIS — R197 Diarrhea, unspecified: Secondary | ICD-10-CM | POA: Diagnosis not present

## 2015-01-19 DIAGNOSIS — K921 Melena: Secondary | ICD-10-CM | POA: Diagnosis not present

## 2015-01-19 DIAGNOSIS — Z6828 Body mass index (BMI) 28.0-28.9, adult: Secondary | ICD-10-CM | POA: Diagnosis not present

## 2015-01-19 DIAGNOSIS — Z7901 Long term (current) use of anticoagulants: Secondary | ICD-10-CM | POA: Diagnosis not present

## 2015-01-19 DIAGNOSIS — I480 Paroxysmal atrial fibrillation: Secondary | ICD-10-CM | POA: Diagnosis not present

## 2015-01-19 LAB — POCT INR: INR: 4

## 2015-01-23 DIAGNOSIS — H353221 Exudative age-related macular degeneration, left eye, with active choroidal neovascularization: Secondary | ICD-10-CM | POA: Diagnosis not present

## 2015-01-26 ENCOUNTER — Ambulatory Visit (INDEPENDENT_AMBULATORY_CARE_PROVIDER_SITE_OTHER): Payer: Medicare Other | Admitting: *Deleted

## 2015-01-26 DIAGNOSIS — I4891 Unspecified atrial fibrillation: Secondary | ICD-10-CM | POA: Diagnosis not present

## 2015-01-26 DIAGNOSIS — Z7901 Long term (current) use of anticoagulants: Secondary | ICD-10-CM

## 2015-01-26 DIAGNOSIS — Z5181 Encounter for therapeutic drug level monitoring: Secondary | ICD-10-CM | POA: Diagnosis not present

## 2015-01-26 LAB — POCT INR: INR: 2

## 2015-02-06 DIAGNOSIS — R1013 Epigastric pain: Secondary | ICD-10-CM | POA: Diagnosis not present

## 2015-02-06 DIAGNOSIS — R1310 Dysphagia, unspecified: Secondary | ICD-10-CM | POA: Diagnosis not present

## 2015-02-06 DIAGNOSIS — K921 Melena: Secondary | ICD-10-CM | POA: Diagnosis not present

## 2015-02-06 DIAGNOSIS — K219 Gastro-esophageal reflux disease without esophagitis: Secondary | ICD-10-CM | POA: Diagnosis not present

## 2015-02-12 ENCOUNTER — Telehealth: Payer: Self-pay | Admitting: Pharmacist

## 2015-02-12 NOTE — Telephone Encounter (Signed)
Received documentation from [facility].  Plan for EGD with dilation.  Pt has a CHADS score of 2.  No hx of stroke or TIA.  Ok to hold Coumadin x 5 days prior.  Clearance faxed back to Dr. Lorie Apley office.  Spoke with pt.  She is aware of recommendations.  Coumadin Clinic visit moved to 11/28 with her Dr. Stanford Breed appt.

## 2015-02-12 NOTE — Telephone Encounter (Signed)
Pt called to report she is scheduled for an endoscopy on 11/14 with Dr. Collene Mares.  We have not seen any request for clearance that is documented in her chart.  Spoke with [place] with Dr. Lorie Apley office.  She will refax request to our fax number.

## 2015-02-13 DIAGNOSIS — H353211 Exudative age-related macular degeneration, right eye, with active choroidal neovascularization: Secondary | ICD-10-CM | POA: Diagnosis not present

## 2015-02-22 DIAGNOSIS — Z23 Encounter for immunization: Secondary | ICD-10-CM | POA: Diagnosis not present

## 2015-02-26 DIAGNOSIS — K219 Gastro-esophageal reflux disease without esophagitis: Secondary | ICD-10-CM | POA: Diagnosis not present

## 2015-02-26 DIAGNOSIS — R1310 Dysphagia, unspecified: Secondary | ICD-10-CM | POA: Diagnosis not present

## 2015-02-26 DIAGNOSIS — K449 Diaphragmatic hernia without obstruction or gangrene: Secondary | ICD-10-CM | POA: Diagnosis not present

## 2015-03-01 ENCOUNTER — Telehealth: Payer: Self-pay | Admitting: Cardiology

## 2015-03-01 NOTE — Telephone Encounter (Signed)
Received records from [facility] for appointment on 03/12/15 with Dr Stanford Breed.  Records given to [facility] (medical records) for Dr Jacalyn Lefevre schedule on 03/12/15. lp

## 2015-03-05 NOTE — Progress Notes (Signed)
HPI: FU permanent atrial fibrillation and SVT. Last echocardiogram in June of 2011 showed normal LV function. There was trivial aortic and mitral regurgitation. Also note she had a cardiac catheterization in December 2003 that showed a 20% first obtuse marginal but otherwise no obstructive disease. She was referred for atrial fibrillation ablation. She had this procedure in June of 2011. Following the procedure she did have recurrent atrial fibrillation. She was seen by Dr. Rayann Heman and consideration of repeat ablation was discussed. However the decision was made for rate control and anticoagulation. 24-hour Holter monitor in December 2015 showed rate was controlled. Since I last saw her the patient has dyspnea with more extreme activities but not with routine activities. It is relieved with rest. It is not associated with chest pain. There is no orthopnea, PND or pedal edema. There is no syncope or palpitations. There is no exertional chest pain.   Current Outpatient Prescriptions  Medication Sig Dispense Refill  . calcium citrate-vitamin D (CITRACAL+D) 315-200 MG-UNIT per tablet Take 1 tablet by mouth daily.      . fish oil-omega-3 fatty acids 1000 MG capsule 2 tab po bid    . fluocinonide (LIDEX) 0.05 % external solution 2 (two) times daily. 1-2 GTTS IN EARS TWICE WEEKLY AS NEEDED     . levothyroxine (SYNTHROID, LEVOTHROID) 75 MCG tablet Take 75 mcg by mouth daily.      Marland Kitchen losartan (COZAAR) 100 MG tablet Take 100 mg by mouth daily.      . Multiple Vitamins-Minerals (OCUVITE PRESERVISION PO) Take 1 tablet by mouth 2 (two) times daily.    . pantoprazole (PROTONIX) 40 MG tablet Take 40 mg by mouth 2 (two) times daily.    . polyethylene glycol powder (GLYCOLAX/MIRALAX) powder Take 17 g by mouth daily. PRN      . raloxifene (EVISTA) 60 MG tablet Take 60 mg by mouth daily.      . verapamil (CALAN-SR) 180 MG CR tablet TAKE ONE TABLET BY MOUTH TWICE DAILY 60 tablet 4  . warfarin (COUMADIN) 5 MG  tablet TAKE 1 TABLET BY MOUTH EVERYDAY OR AS DIRECTED BY COUMADIN CLINIC 90 tablet 1   No current facility-administered medications for this visit.     Past Medical History  Diagnosis Date  . Atrial fibrillation ([place])     ATRIAL FIBRILLATION S/P  PVI 6/11  . GERD (gastroesophageal reflux disease)   . Macular degeneration   . Hyperlipidemia   . Hypertension   . SVT (supraventricular tachycardia) [facility])     Past Surgical History  Procedure Laterality Date  . Total abdominal hysterectomy    . Ablation of dysrhythmic focus  09/2009    s/p PVI by [place] History  . Marital Status: Married    Spouse Name: N/A  . Number of Children: N/A  . Years of Education: N/A   Occupational History  . Not on file.   Social History Main Topics  . Smoking status: Never Smoker   . Smokeless tobacco: Not on file  . Alcohol Use: No  . Drug Use: No  . Sexual Activity: Not on file   Other Topics Concern  . Not on file   Social History Narrative   Lives with spouse    ROS: no fevers or chills, productive cough, hemoptysis, dysphasia, odynophagia, melena, hematochezia, dysuria, hematuria, rash, seizure activity, orthopnea, PND, pedal edema, claudication. Remaining systems are negative.  Physical Exam: Well-developed well-nourished in no acute  distress.  Skin is warm and dry.  HEENT is normal.  Neck is supple.  Chest is clear to auscultation with normal expansion.  Cardiovascular exam is irregular Abdominal exam nontender or distended. No masses palpated. Extremities show no edema. neuro grossly intact  ECG Atrial fibrillation at a rate of 98. Nonspecific ST changes.

## 2015-03-12 ENCOUNTER — Ambulatory Visit (INDEPENDENT_AMBULATORY_CARE_PROVIDER_SITE_OTHER): Payer: Medicare Other | Admitting: Pharmacist Clinician (PhC)/ Clinical Pharmacy Specialist

## 2015-03-12 ENCOUNTER — Ambulatory Visit (INDEPENDENT_AMBULATORY_CARE_PROVIDER_SITE_OTHER): Payer: Medicare Other | Admitting: Cardiology

## 2015-03-12 ENCOUNTER — Encounter: Payer: Self-pay | Admitting: Cardiology

## 2015-03-12 VITALS — BP 120/60 | HR 98 | Ht 61.0 in | Wt 154.6 lb

## 2015-03-12 DIAGNOSIS — I4891 Unspecified atrial fibrillation: Secondary | ICD-10-CM | POA: Diagnosis not present

## 2015-03-12 DIAGNOSIS — I471 Supraventricular tachycardia: Secondary | ICD-10-CM | POA: Diagnosis not present

## 2015-03-12 DIAGNOSIS — I482 Chronic atrial fibrillation, unspecified: Secondary | ICD-10-CM

## 2015-03-12 DIAGNOSIS — Z7901 Long term (current) use of anticoagulants: Secondary | ICD-10-CM

## 2015-03-12 DIAGNOSIS — Z5181 Encounter for therapeutic drug level monitoring: Secondary | ICD-10-CM

## 2015-03-12 DIAGNOSIS — I10 Essential (primary) hypertension: Secondary | ICD-10-CM | POA: Diagnosis not present

## 2015-03-12 LAB — CBC
HCT: 41 % (ref 36.0–46.0)
Hemoglobin: 13.4 g/dL (ref 12.0–15.0)
MCH: 30.9 pg (ref 26.0–34.0)
MCHC: 32.7 g/dL (ref 30.0–36.0)
MCV: 94.5 fL (ref 78.0–100.0)
MPV: 10 fL (ref 8.6–12.4)
PLATELETS: 232 10*3/uL (ref 150–400)
RBC: 4.34 MIL/uL (ref 3.87–5.11)
RDW: 15.5 % (ref 11.5–15.5)
WBC: 4.7 10*3/uL (ref 4.0–10.5)

## 2015-03-12 LAB — BASIC METABOLIC PANEL
BUN: 15 mg/dL (ref 7–25)
CHLORIDE: 104 mmol/L (ref 98–110)
CO2: 26 mmol/L (ref 20–31)
Calcium: 9.4 mg/dL (ref 8.6–10.4)
Creat: 0.97 mg/dL — ABNORMAL HIGH (ref 0.60–0.88)
Glucose, Bld: 75 mg/dL (ref 65–99)
POTASSIUM: 4.4 mmol/L (ref 3.5–5.3)
Sodium: 139 mmol/L (ref 135–146)

## 2015-03-12 LAB — POCT INR: INR: 2.2

## 2015-03-12 NOTE — Assessment & Plan Note (Signed)
- 

## 2015-03-12 NOTE — Assessment & Plan Note (Signed)
Continue verapamil. Continue Coumadin. Check hemoglobin. Not interested in [place].

## 2015-03-12 NOTE — Assessment & Plan Note (Signed)
Blood pressure controlled. Continue present medications. Check potassium and renal function. 

## 2015-03-12 NOTE — Patient Instructions (Signed)
Medication Instructions:   NO CHANGE  Labwork:  Your physician recommends that you HAVE LAB WORK TODAY  Follow-Up:  Your physician wants you to follow-up in: ONE YEAR WITH DR CRENSHAW You will receive a reminder letter in the mail two months in advance. If you don't receive a letter, please call our office to schedule the follow-up appointment.   If you need a refill on your cardiac medications before your next appointment, please call your pharmacy.    

## 2015-04-10 ENCOUNTER — Ambulatory Visit (INDEPENDENT_AMBULATORY_CARE_PROVIDER_SITE_OTHER): Payer: Medicare Other | Admitting: Pharmacist

## 2015-04-10 DIAGNOSIS — I482 Chronic atrial fibrillation, unspecified: Secondary | ICD-10-CM

## 2015-04-10 DIAGNOSIS — Z5181 Encounter for therapeutic drug level monitoring: Secondary | ICD-10-CM | POA: Diagnosis not present

## 2015-04-10 DIAGNOSIS — I4891 Unspecified atrial fibrillation: Secondary | ICD-10-CM

## 2015-04-10 DIAGNOSIS — Z7901 Long term (current) use of anticoagulants: Secondary | ICD-10-CM | POA: Diagnosis not present

## 2015-04-10 LAB — POCT INR: INR: 2.5

## 2015-04-11 DIAGNOSIS — H9113 Presbycusis, bilateral: Secondary | ICD-10-CM | POA: Diagnosis not present

## 2015-04-11 DIAGNOSIS — H608X3 Other otitis externa, bilateral: Secondary | ICD-10-CM | POA: Diagnosis not present

## 2015-04-11 DIAGNOSIS — H6123 Impacted cerumen, bilateral: Secondary | ICD-10-CM | POA: Diagnosis not present

## 2015-04-17 DIAGNOSIS — H353211 Exudative age-related macular degeneration, right eye, with active choroidal neovascularization: Secondary | ICD-10-CM | POA: Diagnosis not present

## 2015-04-24 DIAGNOSIS — H353134 Nonexudative age-related macular degeneration, bilateral, advanced atrophic with subfoveal involvement: Secondary | ICD-10-CM | POA: Diagnosis not present

## 2015-04-24 DIAGNOSIS — H353211 Exudative age-related macular degeneration, right eye, with active choroidal neovascularization: Secondary | ICD-10-CM | POA: Diagnosis not present

## 2015-04-24 DIAGNOSIS — H353221 Exudative age-related macular degeneration, left eye, with active choroidal neovascularization: Secondary | ICD-10-CM | POA: Diagnosis not present

## 2015-04-24 DIAGNOSIS — H35351 Cystoid macular degeneration, right eye: Secondary | ICD-10-CM | POA: Diagnosis not present

## 2015-05-22 DIAGNOSIS — H353211 Exudative age-related macular degeneration, right eye, with active choroidal neovascularization: Secondary | ICD-10-CM | POA: Diagnosis not present

## 2015-05-25 ENCOUNTER — Ambulatory Visit (INDEPENDENT_AMBULATORY_CARE_PROVIDER_SITE_OTHER): Payer: Medicare Other | Admitting: *Deleted

## 2015-05-25 DIAGNOSIS — I482 Chronic atrial fibrillation, unspecified: Secondary | ICD-10-CM

## 2015-05-25 DIAGNOSIS — Z7901 Long term (current) use of anticoagulants: Secondary | ICD-10-CM

## 2015-05-25 DIAGNOSIS — I4891 Unspecified atrial fibrillation: Secondary | ICD-10-CM

## 2015-05-25 DIAGNOSIS — Z5181 Encounter for therapeutic drug level monitoring: Secondary | ICD-10-CM | POA: Diagnosis not present

## 2015-05-25 LAB — POCT INR: INR: 2.1

## 2015-06-01 DIAGNOSIS — R5383 Other fatigue: Secondary | ICD-10-CM | POA: Diagnosis not present

## 2015-06-01 DIAGNOSIS — Z7901 Long term (current) use of anticoagulants: Secondary | ICD-10-CM | POA: Diagnosis not present

## 2015-06-01 LAB — PROTIME-INR: INR: 1.8 — AB (ref 0.9–1.1)

## 2015-06-04 ENCOUNTER — Ambulatory Visit (INDEPENDENT_AMBULATORY_CARE_PROVIDER_SITE_OTHER): Payer: Medicare Other | Admitting: Cardiology

## 2015-06-04 DIAGNOSIS — I482 Chronic atrial fibrillation, unspecified: Secondary | ICD-10-CM

## 2015-06-04 DIAGNOSIS — Z5181 Encounter for therapeutic drug level monitoring: Secondary | ICD-10-CM

## 2015-06-08 ENCOUNTER — Other Ambulatory Visit: Payer: Self-pay | Admitting: Cardiology

## 2015-06-08 NOTE — Telephone Encounter (Signed)
REFILL 

## 2015-07-03 DIAGNOSIS — H353211 Exudative age-related macular degeneration, right eye, with active choroidal neovascularization: Secondary | ICD-10-CM | POA: Diagnosis not present

## 2015-07-06 ENCOUNTER — Ambulatory Visit (INDEPENDENT_AMBULATORY_CARE_PROVIDER_SITE_OTHER): Payer: Medicare Other | Admitting: *Deleted

## 2015-07-06 DIAGNOSIS — Z7901 Long term (current) use of anticoagulants: Secondary | ICD-10-CM | POA: Diagnosis not present

## 2015-07-06 DIAGNOSIS — I4891 Unspecified atrial fibrillation: Secondary | ICD-10-CM | POA: Diagnosis not present

## 2015-07-06 DIAGNOSIS — Z5181 Encounter for therapeutic drug level monitoring: Secondary | ICD-10-CM | POA: Diagnosis not present

## 2015-07-06 DIAGNOSIS — I482 Chronic atrial fibrillation, unspecified: Secondary | ICD-10-CM

## 2015-07-06 LAB — POCT INR: INR: 1.9

## 2015-07-09 ENCOUNTER — Other Ambulatory Visit: Payer: Self-pay | Admitting: Cardiology

## 2015-07-09 NOTE — Telephone Encounter (Signed)
Rx refill sent to pharmacy. 

## 2015-07-20 ENCOUNTER — Ambulatory Visit (INDEPENDENT_AMBULATORY_CARE_PROVIDER_SITE_OTHER): Payer: Medicare Other | Admitting: Pharmacist

## 2015-07-20 DIAGNOSIS — Z5181 Encounter for therapeutic drug level monitoring: Secondary | ICD-10-CM

## 2015-07-20 DIAGNOSIS — I4891 Unspecified atrial fibrillation: Secondary | ICD-10-CM

## 2015-07-20 DIAGNOSIS — I482 Chronic atrial fibrillation, unspecified: Secondary | ICD-10-CM

## 2015-07-20 DIAGNOSIS — Z7901 Long term (current) use of anticoagulants: Secondary | ICD-10-CM | POA: Diagnosis not present

## 2015-07-20 LAB — POCT INR: INR: 2.3

## 2015-07-25 DIAGNOSIS — H353211 Exudative age-related macular degeneration, right eye, with active choroidal neovascularization: Secondary | ICD-10-CM | POA: Diagnosis not present

## 2015-07-25 DIAGNOSIS — H353221 Exudative age-related macular degeneration, left eye, with active choroidal neovascularization: Secondary | ICD-10-CM | POA: Diagnosis not present

## 2015-07-25 DIAGNOSIS — H35351 Cystoid macular degeneration, right eye: Secondary | ICD-10-CM | POA: Diagnosis not present

## 2015-07-25 DIAGNOSIS — H353134 Nonexudative age-related macular degeneration, bilateral, advanced atrophic with subfoveal involvement: Secondary | ICD-10-CM | POA: Diagnosis not present

## 2015-08-10 ENCOUNTER — Ambulatory Visit (INDEPENDENT_AMBULATORY_CARE_PROVIDER_SITE_OTHER): Payer: Medicare Other | Admitting: *Deleted

## 2015-08-10 DIAGNOSIS — Z5181 Encounter for therapeutic drug level monitoring: Secondary | ICD-10-CM

## 2015-08-10 DIAGNOSIS — I482 Chronic atrial fibrillation, unspecified: Secondary | ICD-10-CM

## 2015-08-10 DIAGNOSIS — Z7901 Long term (current) use of anticoagulants: Secondary | ICD-10-CM

## 2015-08-10 DIAGNOSIS — I4891 Unspecified atrial fibrillation: Secondary | ICD-10-CM | POA: Diagnosis not present

## 2015-08-10 LAB — POCT INR: INR: 2.4

## 2015-08-22 DIAGNOSIS — H18451 Nodular corneal degeneration, right eye: Secondary | ICD-10-CM | POA: Diagnosis not present

## 2015-08-22 DIAGNOSIS — Z961 Presence of intraocular lens: Secondary | ICD-10-CM | POA: Diagnosis not present

## 2015-08-22 DIAGNOSIS — H353231 Exudative age-related macular degeneration, bilateral, with active choroidal neovascularization: Secondary | ICD-10-CM | POA: Diagnosis not present

## 2015-08-22 DIAGNOSIS — H04123 Dry eye syndrome of bilateral lacrimal glands: Secondary | ICD-10-CM | POA: Diagnosis not present

## 2015-09-07 ENCOUNTER — Ambulatory Visit (INDEPENDENT_AMBULATORY_CARE_PROVIDER_SITE_OTHER): Payer: Medicare Other | Admitting: *Deleted

## 2015-09-07 DIAGNOSIS — I4891 Unspecified atrial fibrillation: Secondary | ICD-10-CM | POA: Diagnosis not present

## 2015-09-07 DIAGNOSIS — I482 Chronic atrial fibrillation, unspecified: Secondary | ICD-10-CM

## 2015-09-07 DIAGNOSIS — Z5181 Encounter for therapeutic drug level monitoring: Secondary | ICD-10-CM

## 2015-09-07 DIAGNOSIS — Z7901 Long term (current) use of anticoagulants: Secondary | ICD-10-CM | POA: Diagnosis not present

## 2015-09-07 LAB — POCT INR: INR: 2.1

## 2015-09-25 DIAGNOSIS — H353221 Exudative age-related macular degeneration, left eye, with active choroidal neovascularization: Secondary | ICD-10-CM | POA: Diagnosis not present

## 2015-10-01 DIAGNOSIS — H353211 Exudative age-related macular degeneration, right eye, with active choroidal neovascularization: Secondary | ICD-10-CM | POA: Diagnosis not present

## 2015-10-12 ENCOUNTER — Ambulatory Visit (INDEPENDENT_AMBULATORY_CARE_PROVIDER_SITE_OTHER): Payer: Medicare Other | Admitting: *Deleted

## 2015-10-12 DIAGNOSIS — Z5181 Encounter for therapeutic drug level monitoring: Secondary | ICD-10-CM | POA: Diagnosis not present

## 2015-10-12 DIAGNOSIS — I4891 Unspecified atrial fibrillation: Secondary | ICD-10-CM | POA: Diagnosis not present

## 2015-10-12 DIAGNOSIS — I482 Chronic atrial fibrillation, unspecified: Secondary | ICD-10-CM

## 2015-10-12 DIAGNOSIS — Z7901 Long term (current) use of anticoagulants: Secondary | ICD-10-CM

## 2015-10-12 LAB — POCT INR: INR: 2.3

## 2015-10-15 DIAGNOSIS — M545 Low back pain: Secondary | ICD-10-CM | POA: Diagnosis not present

## 2015-10-15 DIAGNOSIS — Z6828 Body mass index (BMI) 28.0-28.9, adult: Secondary | ICD-10-CM | POA: Diagnosis not present

## 2015-10-19 DIAGNOSIS — I10 Essential (primary) hypertension: Secondary | ICD-10-CM | POA: Diagnosis not present

## 2015-10-19 DIAGNOSIS — M859 Disorder of bone density and structure, unspecified: Secondary | ICD-10-CM | POA: Diagnosis not present

## 2015-10-19 DIAGNOSIS — E041 Nontoxic single thyroid nodule: Secondary | ICD-10-CM | POA: Diagnosis not present

## 2015-10-19 DIAGNOSIS — E784 Other hyperlipidemia: Secondary | ICD-10-CM | POA: Diagnosis not present

## 2015-10-25 DIAGNOSIS — I480 Paroxysmal atrial fibrillation: Secondary | ICD-10-CM | POA: Diagnosis not present

## 2015-10-25 DIAGNOSIS — E784 Other hyperlipidemia: Secondary | ICD-10-CM | POA: Diagnosis not present

## 2015-10-25 DIAGNOSIS — F418 Other specified anxiety disorders: Secondary | ICD-10-CM | POA: Diagnosis not present

## 2015-10-25 DIAGNOSIS — H3530 Unspecified macular degeneration: Secondary | ICD-10-CM | POA: Diagnosis not present

## 2015-10-25 DIAGNOSIS — Z7901 Long term (current) use of anticoagulants: Secondary | ICD-10-CM | POA: Diagnosis not present

## 2015-10-25 DIAGNOSIS — Z1389 Encounter for screening for other disorder: Secondary | ICD-10-CM | POA: Diagnosis not present

## 2015-10-25 DIAGNOSIS — E038 Other specified hypothyroidism: Secondary | ICD-10-CM | POA: Diagnosis not present

## 2015-10-25 DIAGNOSIS — Z0000 Encounter for general adult medical examination without abnormal findings: Secondary | ICD-10-CM | POA: Diagnosis not present

## 2015-10-25 DIAGNOSIS — Z6828 Body mass index (BMI) 28.0-28.9, adult: Secondary | ICD-10-CM | POA: Diagnosis not present

## 2015-10-25 DIAGNOSIS — I129 Hypertensive chronic kidney disease with stage 1 through stage 4 chronic kidney disease, or unspecified chronic kidney disease: Secondary | ICD-10-CM | POA: Diagnosis not present

## 2015-10-25 DIAGNOSIS — M545 Low back pain: Secondary | ICD-10-CM | POA: Diagnosis not present

## 2015-10-25 DIAGNOSIS — G4709 Other insomnia: Secondary | ICD-10-CM | POA: Diagnosis not present

## 2015-11-05 ENCOUNTER — Other Ambulatory Visit: Payer: Self-pay

## 2015-11-05 MED ORDER — WARFARIN SODIUM 5 MG PO TABS: ORAL_TABLET | ORAL | 1 refills | Status: DC

## 2015-11-06 ENCOUNTER — Other Ambulatory Visit: Payer: Self-pay | Admitting: Cardiology

## 2015-11-06 DIAGNOSIS — H353211 Exudative age-related macular degeneration, right eye, with active choroidal neovascularization: Secondary | ICD-10-CM | POA: Diagnosis not present

## 2015-11-23 ENCOUNTER — Ambulatory Visit (INDEPENDENT_AMBULATORY_CARE_PROVIDER_SITE_OTHER): Payer: Medicare Other | Admitting: *Deleted

## 2015-11-23 DIAGNOSIS — Z7901 Long term (current) use of anticoagulants: Secondary | ICD-10-CM | POA: Diagnosis not present

## 2015-11-23 DIAGNOSIS — I4891 Unspecified atrial fibrillation: Secondary | ICD-10-CM | POA: Diagnosis not present

## 2015-11-23 DIAGNOSIS — Z5181 Encounter for therapeutic drug level monitoring: Secondary | ICD-10-CM | POA: Diagnosis not present

## 2015-11-23 LAB — POCT INR: INR: 3.1

## 2015-11-27 DIAGNOSIS — H353221 Exudative age-related macular degeneration, left eye, with active choroidal neovascularization: Secondary | ICD-10-CM | POA: Diagnosis not present

## 2015-12-11 DIAGNOSIS — H353221 Exudative age-related macular degeneration, left eye, with active choroidal neovascularization: Secondary | ICD-10-CM | POA: Diagnosis not present

## 2015-12-11 DIAGNOSIS — H353211 Exudative age-related macular degeneration, right eye, with active choroidal neovascularization: Secondary | ICD-10-CM | POA: Diagnosis not present

## 2016-01-04 ENCOUNTER — Ambulatory Visit (INDEPENDENT_AMBULATORY_CARE_PROVIDER_SITE_OTHER): Payer: Medicare Other | Admitting: *Deleted

## 2016-01-04 DIAGNOSIS — Z7901 Long term (current) use of anticoagulants: Secondary | ICD-10-CM | POA: Diagnosis not present

## 2016-01-04 DIAGNOSIS — Z5181 Encounter for therapeutic drug level monitoring: Secondary | ICD-10-CM

## 2016-01-04 DIAGNOSIS — I4891 Unspecified atrial fibrillation: Secondary | ICD-10-CM

## 2016-01-04 LAB — POCT INR: INR: 2.7

## 2016-01-22 DIAGNOSIS — H353211 Exudative age-related macular degeneration, right eye, with active choroidal neovascularization: Secondary | ICD-10-CM | POA: Diagnosis not present

## 2016-01-29 DIAGNOSIS — H353221 Exudative age-related macular degeneration, left eye, with active choroidal neovascularization: Secondary | ICD-10-CM | POA: Diagnosis not present

## 2016-02-09 DIAGNOSIS — Z23 Encounter for immunization: Secondary | ICD-10-CM | POA: Diagnosis not present

## 2016-02-15 ENCOUNTER — Ambulatory Visit (INDEPENDENT_AMBULATORY_CARE_PROVIDER_SITE_OTHER): Payer: Medicare Other | Admitting: *Deleted

## 2016-02-15 DIAGNOSIS — Z7901 Long term (current) use of anticoagulants: Secondary | ICD-10-CM

## 2016-02-15 DIAGNOSIS — I4891 Unspecified atrial fibrillation: Secondary | ICD-10-CM | POA: Diagnosis not present

## 2016-02-15 DIAGNOSIS — Z5181 Encounter for therapeutic drug level monitoring: Secondary | ICD-10-CM

## 2016-02-15 LAB — POCT INR: INR: 3.7

## 2016-03-08 ENCOUNTER — Other Ambulatory Visit: Payer: Self-pay | Admitting: Cardiology

## 2016-03-10 NOTE — Telephone Encounter (Signed)
REFILL 

## 2016-03-17 NOTE — Progress Notes (Signed)
HPI: FU permanent atrial fibrillation and SVT. Last echocardiogram in June of 2011 showed normal LV function. There was trivial aortic and mitral regurgitation. Also note she had a cardiac catheterization in December 2003 that showed a 20% first obtuse marginal but otherwise no obstructive disease. She was referred for atrial fibrillation ablation. She had this procedure in June of 2011. Following the procedure she did have recurrent atrial fibrillation. She was seen by Dr. Rayann Heman and consideration of repeat ablation was discussed. However the decision was made for rate control and anticoagulation. 24-hour Holter monitor in December 2015 showed rate was controlled. Since I last saw her the patient has dyspnea with more extreme activities but not with routine activities. It is relieved with rest. It is not associated with chest pain. There is no orthopnea, PND. There is no syncope or palpitations. There is no exertional chest pain. Mild pedal edema.   Current Outpatient Prescriptions  Medication Sig Dispense Refill  . calcium citrate-vitamin D (CITRACAL+D) 315-200 MG-UNIT per tablet Take 1 tablet by mouth daily.      . fish oil-omega-3 fatty acids 1000 MG capsule 2 tab po bid    . fluocinonide (LIDEX) 0.05 % external solution 2 (two) times daily. 1-2 GTTS IN EARS TWICE WEEKLY AS NEEDED     . levothyroxine (SYNTHROID, LEVOTHROID) 75 MCG tablet Take 75 mcg by mouth daily.      Marland Kitchen losartan (COZAAR) 100 MG tablet Take 100 mg by mouth daily.      . Multiple Vitamins-Minerals (OCUVITE PRESERVISION PO) Take 1 tablet by mouth 2 (two) times daily.    . pantoprazole (PROTONIX) 40 MG tablet Take 40 mg by mouth 2 (two) times daily.    . polyethylene glycol powder (GLYCOLAX/MIRALAX) powder Take 17 g by mouth daily. PRN      . raloxifene (EVISTA) 60 MG tablet Take 60 mg by mouth daily.      . verapamil (CALAN-SR) 180 MG CR tablet TAKE ONE TABLET BY MOUTH TWICE DAILY 180 tablet 0  . warfarin (COUMADIN) 5 MG  tablet TAKE 1 TABLET BY MOUTH EVERYDAY OR AS DIRECTED BY COUMADIN CLINIC 100 tablet 1  . warfarin (COUMADIN) 5 MG tablet TAKE ONE TABLET BY MOUTH ONCE DAILY OR AS DIRECTED BY COUMADIN CLINIC 90 tablet 1   No current facility-administered medications for this visit.      Past Medical History:  Diagnosis Date  . Atrial fibrillation ([place])    ATRIAL FIBRILLATION S/P  PVI 6/11  . GERD (gastroesophageal reflux disease)   . Hyperlipidemia   . Hypertension   . Macular degeneration   . SVT (supraventricular tachycardia) ([facility])     Past Surgical History:  Procedure Laterality Date  . ABLATION OF DYSRHYTHMIC FOCUS  09/2009   s/p PVI by JA  . TOTAL ABDOMINAL HYSTERECTOMY      Social History   Social History  . Marital status: Married    Spouse name: N/A  . Number of children: N/A  . Years of education: N/A   Occupational History  . Not on file.   Social History Main Topics  . Smoking status: Never Smoker  . Smokeless tobacco: Never Used  . Alcohol use No  . Drug use: No  . Sexual activity: Not on file   Other Topics Concern  . Not on file   Social History Narrative   Lives with spouse    Family History  Problem Relation Age of Onset  . Stroke Other   .  Coronary artery disease Other     ROS: no fevers or chills, productive cough, hemoptysis, dysphasia, odynophagia, melena, hematochezia, dysuria, hematuria, rash, seizure activity, orthopnea, PND, claudication. Remaining systems are negative.  Physical Exam: Well-developed well-nourished in no acute distress.  Skin is warm and dry.  HEENT is normal.  Neck is supple.  Chest is clear to auscultation with normal expansion.  Cardiovascular exam is irregular Abdominal exam nontender or distended. No masses palpated. Extremities show trace edema. neuro grossly intact  ECG-Atrial fibrillation at a rate of 84. No ST changes.  A/P  1 hypertension-blood pressure control. Continue present medications.  2 history of  SVT-continue verapamil.  3 permanent atrial fibrillation. Continue verapamil and Coumadin. Pt given names of DOACs. She will contact us if affordable.  Kirk Ruths, MD

## 2016-03-18 DIAGNOSIS — H35351 Cystoid macular degeneration, right eye: Secondary | ICD-10-CM | POA: Diagnosis not present

## 2016-03-18 DIAGNOSIS — H353221 Exudative age-related macular degeneration, left eye, with active choroidal neovascularization: Secondary | ICD-10-CM | POA: Diagnosis not present

## 2016-03-18 DIAGNOSIS — H353211 Exudative age-related macular degeneration, right eye, with active choroidal neovascularization: Secondary | ICD-10-CM | POA: Diagnosis not present

## 2016-03-20 ENCOUNTER — Ambulatory Visit (INDEPENDENT_AMBULATORY_CARE_PROVIDER_SITE_OTHER): Payer: Medicare Other | Admitting: Cardiology

## 2016-03-20 ENCOUNTER — Ambulatory Visit (INDEPENDENT_AMBULATORY_CARE_PROVIDER_SITE_OTHER): Payer: Medicare Other | Admitting: Pharmacist

## 2016-03-20 ENCOUNTER — Encounter: Payer: Self-pay | Admitting: Cardiology

## 2016-03-20 VITALS — BP 132/74 | HR 84 | Ht 62.0 in | Wt 152.0 lb

## 2016-03-20 DIAGNOSIS — I4891 Unspecified atrial fibrillation: Secondary | ICD-10-CM

## 2016-03-20 DIAGNOSIS — Z5181 Encounter for therapeutic drug level monitoring: Secondary | ICD-10-CM

## 2016-03-20 DIAGNOSIS — I471 Supraventricular tachycardia, unspecified: Secondary | ICD-10-CM

## 2016-03-20 DIAGNOSIS — I10 Essential (primary) hypertension: Secondary | ICD-10-CM | POA: Diagnosis not present

## 2016-03-20 DIAGNOSIS — Z7901 Long term (current) use of anticoagulants: Secondary | ICD-10-CM | POA: Diagnosis not present

## 2016-03-20 LAB — POCT INR: INR: 3.5

## 2016-03-20 NOTE — Patient Instructions (Signed)
Your physician wants you to follow-up in: [place] will receive a reminder letter in the mail two months in advance. If you don't receive a letter, please call our office to schedule the follow-up appointment.   If you need a refill on your cardiac medications before your next appointment, please call your pharmacy.   [place] OR ELIQUIS TO REPLCE WARFARIN

## 2016-04-17 ENCOUNTER — Ambulatory Visit (INDEPENDENT_AMBULATORY_CARE_PROVIDER_SITE_OTHER): Payer: Medicare Other | Admitting: *Deleted

## 2016-04-17 DIAGNOSIS — Z5181 Encounter for therapeutic drug level monitoring: Secondary | ICD-10-CM | POA: Diagnosis not present

## 2016-04-17 DIAGNOSIS — Z7901 Long term (current) use of anticoagulants: Secondary | ICD-10-CM

## 2016-04-17 DIAGNOSIS — I4891 Unspecified atrial fibrillation: Secondary | ICD-10-CM | POA: Diagnosis not present

## 2016-04-17 LAB — POCT INR: INR: 3.8

## 2016-05-02 ENCOUNTER — Ambulatory Visit (INDEPENDENT_AMBULATORY_CARE_PROVIDER_SITE_OTHER): Payer: Medicare Other | Admitting: Pharmacist

## 2016-05-02 DIAGNOSIS — Z5181 Encounter for therapeutic drug level monitoring: Secondary | ICD-10-CM | POA: Diagnosis not present

## 2016-05-02 DIAGNOSIS — Z7901 Long term (current) use of anticoagulants: Secondary | ICD-10-CM | POA: Diagnosis not present

## 2016-05-02 DIAGNOSIS — I4891 Unspecified atrial fibrillation: Secondary | ICD-10-CM

## 2016-05-02 LAB — POCT INR: INR: 2.3

## 2016-05-06 DIAGNOSIS — H353221 Exudative age-related macular degeneration, left eye, with active choroidal neovascularization: Secondary | ICD-10-CM | POA: Diagnosis not present

## 2016-05-06 DIAGNOSIS — H353134 Nonexudative age-related macular degeneration, bilateral, advanced atrophic with subfoveal involvement: Secondary | ICD-10-CM | POA: Diagnosis not present

## 2016-05-06 DIAGNOSIS — H353211 Exudative age-related macular degeneration, right eye, with active choroidal neovascularization: Secondary | ICD-10-CM | POA: Diagnosis not present

## 2016-05-06 DIAGNOSIS — H35351 Cystoid macular degeneration, right eye: Secondary | ICD-10-CM | POA: Diagnosis not present

## 2016-05-07 ENCOUNTER — Other Ambulatory Visit: Payer: Self-pay | Admitting: Cardiology

## 2016-05-13 DIAGNOSIS — H353211 Exudative age-related macular degeneration, right eye, with active choroidal neovascularization: Secondary | ICD-10-CM | POA: Diagnosis not present

## 2016-05-21 ENCOUNTER — Ambulatory Visit (INDEPENDENT_AMBULATORY_CARE_PROVIDER_SITE_OTHER): Payer: Medicare Other | Admitting: *Deleted

## 2016-05-21 DIAGNOSIS — Z7901 Long term (current) use of anticoagulants: Secondary | ICD-10-CM | POA: Diagnosis not present

## 2016-05-21 DIAGNOSIS — Z5181 Encounter for therapeutic drug level monitoring: Secondary | ICD-10-CM

## 2016-05-21 DIAGNOSIS — I4891 Unspecified atrial fibrillation: Secondary | ICD-10-CM | POA: Diagnosis not present

## 2016-05-21 LAB — POCT INR: INR: 2.5

## 2016-06-07 ENCOUNTER — Other Ambulatory Visit: Payer: Self-pay | Admitting: Cardiology

## 2016-06-09 NOTE — Telephone Encounter (Signed)
Rx(s) sent to pharmacy electronically.  

## 2016-06-17 DIAGNOSIS — H353211 Exudative age-related macular degeneration, right eye, with active choroidal neovascularization: Secondary | ICD-10-CM | POA: Diagnosis not present

## 2016-06-17 DIAGNOSIS — H353221 Exudative age-related macular degeneration, left eye, with active choroidal neovascularization: Secondary | ICD-10-CM | POA: Diagnosis not present

## 2016-06-17 DIAGNOSIS — H35351 Cystoid macular degeneration, right eye: Secondary | ICD-10-CM | POA: Diagnosis not present

## 2016-06-24 ENCOUNTER — Ambulatory Visit (INDEPENDENT_AMBULATORY_CARE_PROVIDER_SITE_OTHER): Payer: Medicare Other

## 2016-06-24 DIAGNOSIS — Z7901 Long term (current) use of anticoagulants: Secondary | ICD-10-CM | POA: Diagnosis not present

## 2016-06-24 DIAGNOSIS — I4891 Unspecified atrial fibrillation: Secondary | ICD-10-CM

## 2016-06-24 DIAGNOSIS — Z5181 Encounter for therapeutic drug level monitoring: Secondary | ICD-10-CM | POA: Diagnosis not present

## 2016-06-24 LAB — POCT INR: INR: 1.8

## 2016-07-01 DIAGNOSIS — H353221 Exudative age-related macular degeneration, left eye, with active choroidal neovascularization: Secondary | ICD-10-CM | POA: Diagnosis not present

## 2016-07-04 DIAGNOSIS — N644 Mastodynia: Secondary | ICD-10-CM | POA: Diagnosis not present

## 2016-07-04 DIAGNOSIS — Z6828 Body mass index (BMI) 28.0-28.9, adult: Secondary | ICD-10-CM | POA: Diagnosis not present

## 2016-07-10 ENCOUNTER — Telehealth: Payer: Self-pay | Admitting: Cardiology

## 2016-07-10 NOTE — Telephone Encounter (Signed)
Returned call to the patient. She stated that she has been tired and weak for a week. Last night she had right arm pain that radiated to her neck and back. She stated that her chest felt heavy. She did not take anything for the pain and it resolved on its own. Her blood pressure was 132/69 and heart rate was 99 at the time of the call. I advised her to go to the Emergency Room but she declined and stated that she would rather wait for an appointment. An appointment was made for Monday April 2 at 10:30 with Bernerd Pho, [place]. I again advised her that if her symptoms got worse or she developed chest pain again to please go to the Emergency room. She stated that she understood.

## 2016-07-10 NOTE — Telephone Encounter (Signed)
New Message  Pt voiced wanting to speak with nurse.

## 2016-07-11 ENCOUNTER — Encounter: Payer: Self-pay | Admitting: Student

## 2016-07-11 NOTE — Progress Notes (Signed)
Cardiology Office Note    Date:  07/14/2016   ID:  Sylvia [name], DOB 1933/02/04, MRN [id number]  PCP:  Sylvia Lopes, MD  Cardiologist: Dr. Stanford Breed  Chief Complaint  Patient presents with  . Chest Pain  . Shortness of Breath    History of Present Illness:    Sylvia [name] is a 83 y.o. female with past medical history of permanent atrial fibrillation (s/p ablation in 2011 with recurrent AF), pSVT, minimal CAD by cath in 03/2002, HTN, and HLD who presents to the office today for evaluation of chest pain.   Was last seen by Dr. Stanford Breed in 03/2016 and reported having worsening dyspnea with exertion. Denied any associated chest pain, palpitations, lightheadedness, dizziness, or presyncope. She was continued on her current medications at that time including Coumadin and Verapamil.   She called Triage on 07/10/2016 reported worsening fatigue for the past week. On 3/28, she experienced right arm pain which radiated to her neck and back with an associated heaviness across her chest. It was recommended she go to the ED for further evaluation but she declined and requested a follow-up appointment.   In talking with the patient today, she reports having an episode of chest pain last Tuesday night which awoke her from sleep. Says there was a radiating pain down her right arm and a stinging sensation in her chest. The symptoms lasted for 30 minutes and spontaneously resolved. Since then, she denies any repeat episodes of chest pain or radiating pain down her arms.  However, she does report worsening dyspnea on exertion and fatigue for the past 6 months. She has noticed this when performing simple household chores, such as vacuuming. She has to stop and rest multiple times when cleaning one room. The patient and her husband use to walk at [facility] 3x per week for exercise but she is now unable to do this secondary to her worsening symptoms. Denies any associated palpitations, lightheadedness,  dizziness, presyncope, orthopnea, PND, or lower extremity edema.     Past Medical History:  Diagnosis Date  . Atrial fibrillation ([place])    ATRIAL FIBRILLATION S/P  PVI 6/11  . CAD (coronary artery disease)    a. 03/2002: cath showing 20% OM1 stenosis, EF at 65%, no WMA. No significant CAD.   Marland Kitchen GERD (gastroesophageal reflux disease)   . Hyperlipidemia   . Hypertension   . Macular degeneration   . SVT (supraventricular tachycardia) (HCC)     Past Surgical History:  Procedure Laterality Date  . ABLATION OF DYSRHYTHMIC FOCUS  09/2009   s/p PVI by JA  . TOTAL ABDOMINAL HYSTERECTOMY      Current Medications: Outpatient Medications Prior to Visit  Medication Sig Dispense Refill  . calcium citrate-vitamin D (CITRACAL+D) 315-200 MG-UNIT per tablet Take 1 tablet by mouth daily.      . fish oil-omega-3 fatty acids 1000 MG capsule Take 1 g by mouth 2 (two) times daily.     . fluocinonide (LIDEX) 0.05 % external solution 2 (two) times daily. 1-2 GTTS IN EARS TWICE WEEKLY AS NEEDED     . levothyroxine (SYNTHROID, LEVOTHROID) 75 MCG tablet Take 75 mcg by mouth daily.      Marland Kitchen losartan (COZAAR) 100 MG tablet Take 100 mg by mouth daily.      . Multiple Vitamins-Minerals (OCUVITE PRESERVISION PO) Take 1 tablet by mouth 2 (two) times daily.    . pantoprazole (PROTONIX) 40 MG tablet Take 40 mg by mouth 2 (two) times daily.    Marland Kitchen  polyethylene glycol powder (GLYCOLAX/MIRALAX) powder Take 17 g by mouth daily. PRN      . raloxifene (EVISTA) 60 MG tablet Take 60 mg by mouth daily.      . verapamil (CALAN-SR) 180 MG CR tablet TAKE ONE TABLET BY MOUTH TWICE DAILY 180 tablet 2  . warfarin (COUMADIN) 5 MG tablet TAKE ONE TABLET BY MOUTH ONCE DAILY OR AS DIRECTED BY COUMADIN CLINIC 90 tablet 1  . warfarin (COUMADIN) 5 MG tablet TAKE ONE TABLET BY MOUTH ONCE DAILY OR  AS  DIRECTED  BY  COUMADIN  CLINIC 100 tablet 1  . traZODone (DESYREL) 50 MG tablet Take 25 mg by mouth at bedtime.     No facility-administered  medications prior to visit.      Allergies:   Metoclopramide hcl; Penicillins; Pravastatin; and Sulfonamide derivatives   Social History   Social History  . Marital status: Married    Spouse name: N/A  . Number of children: N/A  . Years of education: N/A   Social History Main Topics  . Smoking status: Never Smoker  . Smokeless tobacco: Never Used  . Alcohol use No  . Drug use: No  . Sexual activity: Not Asked   Other Topics Concern  . None   Social History Narrative   Lives with spouse     Family History:  The patient's family history includes Coronary artery disease in her other; Stroke in her other.   Review of Systems:   Please see the history of present illness.     General:  No chills, fever, night sweats or weight changes.  Cardiovascular:  No edema, orthopnea, palpitations, paroxysmal nocturnal dyspnea. Positive for chest pain and dyspnea on exertion.  Dermatological: No rash, lesions/masses Respiratory: No cough, dyspnea Urologic: No hematuria, dysuria Abdominal:   No nausea, vomiting, diarrhea, bright red blood per rectum, melena, or hematemesis Neurologic:  No visual changes, wkns, changes in mental status. All other systems reviewed and are otherwise negative except as noted above.   Physical Exam:    VS:  BP 120/72   Pulse 82   Ht 5' 1.5" (1.562 m)   Wt 149 lb (67.6 kg)   BMI 27.70 kg/m    General: Well developed, well nourished elderly Caucasian female appearing in no acute distress. Head: Normocephalic, atraumatic, sclera non-icteric, no xanthomas, nares are without discharge.  Neck: No carotid bruits. JVD not elevated.  Lungs: Respirations regular and unlabored, without wheezes or rales.  Heart: Irregularly irregular. No S3 or S4.  No murmur, no rubs, or gallops appreciated. Abdomen: Soft, non-tender, non-distended with normoactive bowel sounds. No hepatomegaly. No rebound/guarding. No obvious abdominal masses. Msk:  Strength and tone appear  normal for age. No joint deformities or effusions. Extremities: No clubbing or cyanosis. No lower extremity edema.  Distal pedal pulses are 2+ bilaterally. Neuro: Alert and oriented X 3. Moves all extremities spontaneously. No focal deficits noted. Psych:  Responds to questions appropriately with a normal affect. Skin: No rashes or lesions noted  Wt Readings from Last 3 Encounters:  07/14/16 149 lb (67.6 kg)  03/20/16 152 lb (68.9 kg)  03/12/15 154 lb 9.6 oz (70.1 kg)     Studies/Labs Reviewed:   EKG:  EKG is ordered today.  The ekg ordered today demonstrates atrial fibrillation, HR 82, no acute ST or T-wave changes.   Recent Labs: No results found for requested labs within last 8760 hours.   Lipid Panel No results found for: CHOL, TRIG, HDL, CHOLHDL, VLDL, LDLCALC,  LDLDIRECT  Additional studies/ records that were reviewed today include:   Cardiac Catheterization: 03/2002 FINDINGS:  1. Hemodynamics     A. Left ventricular pressure 131/9 mmHg.     B. Aortic pressure 131/63 mmHg.  2. Ventriculography:  Ejection fraction 65%.  No segmental wall motion     abnormalities.  No mitral regurgitation.  3. Selective coronary angiography     A. The left main coronary artery was a large caliber vessel with no        evidence of flow-limiting disease.     B. The left anterior descending artery was a large caliber vessel        wrapping around the apex.  The diagonal branches were free of flow-        limiting disease as well as the LAD proper.     C. There was a small ramus branch which was free of flow-limiting        disease.     D. The circumflex coronary artery was a large caliber vessel giving rise        to two obtuse marginal branches.  The first obtuse marginal branch had        a diffuse 20% narrowing.     E. The right coronary artery was a large caliber vessel giving rise to a        posterolateral branch and a posterior descending artery.  There was no        flow-limiting  disease within this distribution.    RECOMMENDATIONS:  No angiographic evidence of significant epicardial  coronary artery disease.  The patient's chest pain is unlikely cardiac in  origin.  She will be referred to Dr. Jake Michaelis for further followup.  Assessment:    1. DOE (dyspnea on exertion)   2. Chest pain, unspecified type   3. Permanent atrial fibrillation ([place])   4. Long term current use of anticoagulant therapy   5. Essential hypertension, benign      Plan:   In order of problems listed above:  1. Chest Pain/ Dyspnea on Exertion - reports worsening dyspnea on exertion and fatigue for the past 6 months, occurring when walking around the grocery store or vacuuming floors. Experienced an episode of chest pain which awoke her from sleep last week. Denies any recurrent symptoms.  - last ischemic evaluation was a cardiac catheterization in 2003 which showed minimal CAD. EKG today is without acute ischemic changes.  - will obtain a Lexiscan Myoview to assess for ischemia in the setting of her worsening symptoms. If no significant findings, consider an echocardiogram to assess for structural abnormalities, granted no significant murmurs are appreciated on examination today.    2. Permanent Atrial Fibrillation  - s/p ablation in 2011 with recurrent AF. HR remains well-controlled in the 80's.  - continue Verapamil HCl 180mg  BID for rate-control and Coumadin for anticoagulation.   3. Long-term Use of Anticoagulant Therapy - This patients CHA2DS2-VASc Score and unadjusted Ischemic Stroke Rate (% per year) is equal to 7.2 % stroke rate/year from a score of 5 (HTN, Vascular, Female, Age (2)). She denies any evidence of active bleeding. Continue Coumadin for anticoagulation.   4. HTN - BP well-controlled at 120/72 - continue current medication regimen.    Medication Adjustments/Labs and Tests Ordered: Current medicines are reviewed at length with the patient today.  Concerns regarding  medicines are outlined above.  Medication changes, Labs and Tests ordered today are listed in the Patient Instructions below. Patient Instructions  Medication Instructions: Your physician recommends that you continue on your current medications as directed. Please refer to the Current Medication list given to you today.  Testing/Procedures: Your physician has requested that you have a lexiscan myoview. For further information please visit [email or web address]. Please follow instruction sheet, as given.  Follow-Up: Your physician wants you to follow-up in: 6 months with Dr. Stanford Breed. You will receive a reminder letter in the mail two months in advance. If you don't receive a letter, please call our office to schedule the follow-up appointment.  Arna Medici, [place]  07/14/2016 7:48 PM    [facility] [place], [place], [place]  [address] Phone: [phone]; Fax: [phone]  [address], [place], [place] [address] Phone: [phone]

## 2016-07-14 ENCOUNTER — Ambulatory Visit (INDEPENDENT_AMBULATORY_CARE_PROVIDER_SITE_OTHER): Payer: Medicare Other | Admitting: *Deleted

## 2016-07-14 ENCOUNTER — Ambulatory Visit (INDEPENDENT_AMBULATORY_CARE_PROVIDER_SITE_OTHER): Payer: Medicare Other | Admitting: Student

## 2016-07-14 ENCOUNTER — Encounter: Payer: Self-pay | Admitting: Student

## 2016-07-14 VITALS — BP 120/72 | HR 82 | Ht 61.5 in | Wt 149.0 lb

## 2016-07-14 DIAGNOSIS — R0609 Other forms of dyspnea: Secondary | ICD-10-CM

## 2016-07-14 DIAGNOSIS — I10 Essential (primary) hypertension: Secondary | ICD-10-CM | POA: Diagnosis not present

## 2016-07-14 DIAGNOSIS — Z7901 Long term (current) use of anticoagulants: Secondary | ICD-10-CM

## 2016-07-14 DIAGNOSIS — I4891 Unspecified atrial fibrillation: Secondary | ICD-10-CM

## 2016-07-14 DIAGNOSIS — I482 Chronic atrial fibrillation: Secondary | ICD-10-CM | POA: Diagnosis not present

## 2016-07-14 DIAGNOSIS — I4821 Permanent atrial fibrillation: Secondary | ICD-10-CM

## 2016-07-14 DIAGNOSIS — Z5181 Encounter for therapeutic drug level monitoring: Secondary | ICD-10-CM

## 2016-07-14 DIAGNOSIS — R079 Chest pain, unspecified: Secondary | ICD-10-CM | POA: Diagnosis not present

## 2016-07-14 LAB — POCT INR: INR: 2.6

## 2016-07-14 NOTE — Patient Instructions (Signed)
Medication Instructions: Your physician recommends that you continue on your current medications as directed. Please refer to the Current Medication list given to you today.  Testing/Procedures: Your physician has requested that you have a lexiscan myoview. For further information please visit [email or web address]. Please follow instruction sheet, as given.  Follow-Up: Your physician wants you to follow-up in: 6 months with Dr. Stanford Breed. You will receive a reminder letter in the mail two months in advance. If you don't receive a letter, please call our office to schedule the follow-up appointment.   Any Other Special Instructions will be listed below:  Pharmacologic Stress Electrocardiogram Introduction A pharmacologic stress electrocardiogram is a heart (cardiac) test that uses nuclear imaging to evaluate the blood supply to your heart. This test may also be called a pharmacologic stress electrocardiography. Pharmacologic means that a medicine is used to increase your heart rate and blood pressure. This stress test is done to find areas of poor blood flow to the heart by determining the extent of coronary artery disease (CAD). Some people exercise on a treadmill, which naturally increases the blood flow to the heart. For those people unable to exercise on a treadmill, a medicine is used. This medicine stimulates your heart and will cause your heart to beat harder and more quickly, as if you were exercising. Pharmacologic stress tests can help determine:  The adequacy of blood flow to your heart during increased levels of activity in order to clear you for discharge home.  The extent of coronary artery blockage caused by CAD.  Your prognosis if you have suffered a heart attack.  The effectiveness of cardiac procedures done, such as an angioplasty, which can increase the circulation in your coronary arteries.  Causes of chest pain or pressure. LET [facility] CARE PROVIDER KNOW ABOUT:  Any  allergies you have.  All medicines you are taking, including vitamins, herbs, eye drops, creams, and over-the-counter medicines.  Previous problems you or members of your family have had with the use of anesthetics.  Any blood disorders you have.  Previous surgeries you have had.  Medical conditions you have.  Possibility of pregnancy, if this applies.  If you are currently breastfeeding. RISKS AND COMPLICATIONS Generally, this is a safe procedure. However, as with any procedure, complications can occur. Possible complications include:  You develop pain or pressure in the following areas:  Chest.  Jaw or neck.  Between your shoulder blades.  Radiating down your left arm.  Headache.  Dizziness or light-headedness.  Shortness of breath.  Increased or irregular heartbeat.  Low blood pressure.  Nausea or vomiting.  Flushing.  Redness going up the arm and slight pain during injection of medicine.  Heart attack (rare). BEFORE THE PROCEDURE  Avoid all forms of caffeine for 24 hours before your test or as directed by your health care provider. This includes coffee, tea (even decaffeinated tea), caffeinated sodas, chocolate, cocoa, and certain pain medicines.  Follow your health care provider's instructions regarding eating and drinking before the test.  Take your medicines as directed at regular times with water unless instructed otherwise. Exceptions may include:  If you have diabetes, ask how you are to take your insulin or pills. It is common to adjust insulin dosing the morning of the test.  If you are taking beta-blocker medicines, it is important to talk to your health care provider about these medicines well before the date of your test. Taking beta-blocker medicines may interfere with the test. In some cases, these medicines need  to be changed or stopped 24 hours or more before the test.  If you wear a nitroglycerin patch, it may need to be removed prior to  the test. Ask your health care provider if the patch should be removed before the test.  If you use an inhaler for any breathing condition, bring it with you to the test.  If you are an outpatient, bring a snack so you can eat right after the stress phase of the test.  Do not smoke for 4 hours prior to the test or as directed by your health care provider.  Do not apply lotions, powders, creams, or oils on your chest prior to the test.  Wear comfortable shoes and clothing. Let your health care provider know if you were unable to complete or follow the preparations for your test. PROCEDURE  Multiple patches (electrodes) will be put on your chest. If needed, small areas of your chest may be shaved to get better contact with the electrodes. Once the electrodes are attached to your body, multiple wires will be attached to the electrodes, and your heart rate will be monitored.  An IV access will be started. A nuclear trace (isotope) is given. The isotope may be given intravenously, or it may be swallowed. Nuclear refers to several types of radioactive isotopes, and the nuclear isotope lights up the arteries so that the nuclear images are clear. The isotope is absorbed by your body. This results in low radiation exposure.  A resting nuclear image is taken to show how your heart functions at rest.  A medicine is given through the IV access.  A second scan is done about 1 hour after the medicine injection and determines how your heart functions under stress.  During this stress phase, you will be connected to an electrocardiogram machine. Your blood pressure and oxygen levels will be monitored. What to expect after the procedure  Your heart rate and blood pressure will be monitored after the test.  You may return to your normal schedule, including diet,activities, and medicines, unless your health care provider tells you otherwise. This information is not intended to replace advice given to you  by your health care provider. Make sure you discuss any questions you have with your health care provider. Document Released: 08/17/2008 Document Revised: 09/06/2015 Document Reviewed: 10/08/2015 Elsevier Interactive Patient Education  2017 [employer].

## 2016-07-16 NOTE — Addendum Note (Signed)
Addended by: Milderd Meager on: 07/16/2016 12:13 PM   Modules accepted: Orders

## 2016-07-21 DIAGNOSIS — Z1231 Encounter for screening mammogram for malignant neoplasm of breast: Secondary | ICD-10-CM | POA: Diagnosis not present

## 2016-07-21 DIAGNOSIS — N644 Mastodynia: Secondary | ICD-10-CM | POA: Diagnosis not present

## 2016-07-22 ENCOUNTER — Telehealth (HOSPITAL_COMMUNITY): Payer: Self-pay

## 2016-07-22 DIAGNOSIS — H353221 Exudative age-related macular degeneration, left eye, with active choroidal neovascularization: Secondary | ICD-10-CM | POA: Diagnosis not present

## 2016-07-22 DIAGNOSIS — H353211 Exudative age-related macular degeneration, right eye, with active choroidal neovascularization: Secondary | ICD-10-CM | POA: Diagnosis not present

## 2016-07-22 NOTE — Telephone Encounter (Signed)
Encounter complete. 

## 2016-07-24 ENCOUNTER — Ambulatory Visit (HOSPITAL_COMMUNITY)
Admission: RE | Admit: 2016-07-24 | Discharge: 2016-07-24 | Disposition: A | Discharge status: Home / Self Care | Payer: Medicare Other | Source: Ambulatory Visit | Attending: Cardiovascular Disease | Admitting: Cardiovascular Disease

## 2016-07-24 DIAGNOSIS — R0609 Other forms of dyspnea: Secondary | ICD-10-CM | POA: Insufficient documentation

## 2016-07-24 DIAGNOSIS — R002 Palpitations: Secondary | ICD-10-CM | POA: Diagnosis not present

## 2016-07-24 DIAGNOSIS — I2510 Atherosclerotic heart disease of native coronary artery without angina pectoris: Secondary | ICD-10-CM | POA: Diagnosis not present

## 2016-07-24 DIAGNOSIS — E785 Hyperlipidemia, unspecified: Secondary | ICD-10-CM | POA: Insufficient documentation

## 2016-07-24 DIAGNOSIS — Z8249 Family history of ischemic heart disease and other diseases of the circulatory system: Secondary | ICD-10-CM | POA: Diagnosis not present

## 2016-07-24 DIAGNOSIS — I480 Paroxysmal atrial fibrillation: Secondary | ICD-10-CM | POA: Diagnosis not present

## 2016-07-24 DIAGNOSIS — R079 Chest pain, unspecified: Secondary | ICD-10-CM | POA: Diagnosis not present

## 2016-07-24 DIAGNOSIS — I10 Essential (primary) hypertension: Secondary | ICD-10-CM | POA: Insufficient documentation

## 2016-07-24 LAB — MYOCARDIAL PERFUSION IMAGING
CHL CUP NUCLEAR SDS: 1
CHL CUP NUCLEAR SRS: 0
CHL CUP NUCLEAR SSS: 1
LV dias vol: 61 mL (ref 46–106)
LV sys vol: 27 mL
NUC STRESS TID: 1.02
Peak HR: 129 {beats}/min
Rest HR: 92 {beats}/min

## 2016-07-24 MED ORDER — AMINOPHYLLINE 25 MG/ML IV SOLN
75.0000 mg | Freq: Once | INTRAVENOUS | Status: AC
  Administered 2016-07-24: 75 mg via INTRAVENOUS

## 2016-07-24 MED ORDER — REGADENOSON 0.4 MG/5ML IV SOLN
0.4000 mg | Freq: Once | INTRAVENOUS | Status: AC
  Administered 2016-07-24: 0.4 mg via INTRAVENOUS

## 2016-07-24 MED ORDER — TECHNETIUM TC 99M TETROFOSMIN IV KIT
30.5000 | PACK | Freq: Once | INTRAVENOUS | Status: AC | PRN
  Administered 2016-07-24: 30.5 via INTRAVENOUS
  Filled 2016-07-24: qty 31

## 2016-07-24 MED ORDER — TECHNETIUM TC 99M TETROFOSMIN IV KIT
10.1000 | PACK | Freq: Once | INTRAVENOUS | Status: AC | PRN
  Administered 2016-07-24: 10.1 via INTRAVENOUS
  Filled 2016-07-24: qty 11

## 2016-08-18 ENCOUNTER — Ambulatory Visit (INDEPENDENT_AMBULATORY_CARE_PROVIDER_SITE_OTHER): Payer: Medicare Other | Admitting: Pharmacist

## 2016-08-18 DIAGNOSIS — I4891 Unspecified atrial fibrillation: Secondary | ICD-10-CM

## 2016-08-18 DIAGNOSIS — Z7901 Long term (current) use of anticoagulants: Secondary | ICD-10-CM | POA: Diagnosis not present

## 2016-08-18 DIAGNOSIS — I482 Chronic atrial fibrillation: Secondary | ICD-10-CM | POA: Diagnosis not present

## 2016-08-18 DIAGNOSIS — Z5181 Encounter for therapeutic drug level monitoring: Secondary | ICD-10-CM | POA: Diagnosis not present

## 2016-08-18 DIAGNOSIS — I4821 Permanent atrial fibrillation: Secondary | ICD-10-CM

## 2016-08-18 LAB — POCT INR: INR: 2.5

## 2016-08-19 DIAGNOSIS — H35351 Cystoid macular degeneration, right eye: Secondary | ICD-10-CM | POA: Diagnosis not present

## 2016-08-19 DIAGNOSIS — H353211 Exudative age-related macular degeneration, right eye, with active choroidal neovascularization: Secondary | ICD-10-CM | POA: Diagnosis not present

## 2016-08-19 DIAGNOSIS — H353221 Exudative age-related macular degeneration, left eye, with active choroidal neovascularization: Secondary | ICD-10-CM | POA: Diagnosis not present

## 2016-09-02 DIAGNOSIS — H35351 Cystoid macular degeneration, right eye: Secondary | ICD-10-CM | POA: Diagnosis not present

## 2016-09-02 DIAGNOSIS — H3570 Unspecified separation of retinal layers: Secondary | ICD-10-CM | POA: Diagnosis not present

## 2016-09-02 DIAGNOSIS — H353212 Exudative age-related macular degeneration, right eye, with inactive choroidal neovascularization: Secondary | ICD-10-CM | POA: Diagnosis not present

## 2016-09-02 DIAGNOSIS — H353134 Nonexudative age-related macular degeneration, bilateral, advanced atrophic with subfoveal involvement: Secondary | ICD-10-CM | POA: Diagnosis not present

## 2016-09-02 DIAGNOSIS — H35363 Drusen (degenerative) of macula, bilateral: Secondary | ICD-10-CM | POA: Diagnosis not present

## 2016-09-19 ENCOUNTER — Ambulatory Visit (INDEPENDENT_AMBULATORY_CARE_PROVIDER_SITE_OTHER): Payer: Medicare Other | Admitting: *Deleted

## 2016-09-19 DIAGNOSIS — Z7901 Long term (current) use of anticoagulants: Secondary | ICD-10-CM

## 2016-09-19 DIAGNOSIS — Z5181 Encounter for therapeutic drug level monitoring: Secondary | ICD-10-CM

## 2016-09-19 DIAGNOSIS — I4891 Unspecified atrial fibrillation: Secondary | ICD-10-CM | POA: Diagnosis not present

## 2016-09-19 LAB — POCT INR: INR: 2.5

## 2016-10-01 ENCOUNTER — Telehealth: Payer: Self-pay | Admitting: *Deleted

## 2016-10-01 NOTE — Telephone Encounter (Signed)
Pt's husband called stating that she is not able to walk on her right leg due to pain and has taken 2 Ibuprofen today and he  asked if she could take 1 more this afternoon and then 1 tonight This nurse instructed that she cannot routinely take Ibuprofen with coumadin as will increase her change of having GI bleed but thought okay to take 1 this afternoon and 1 tonight for relief of pain as they have called PCP and have an appt to be seen tomorrow regarding this complaint Also instructed if becomes worse to go to ER and he states understaniding

## 2016-10-02 DIAGNOSIS — M542 Cervicalgia: Secondary | ICD-10-CM | POA: Diagnosis not present

## 2016-10-02 DIAGNOSIS — I129 Hypertensive chronic kidney disease with stage 1 through stage 4 chronic kidney disease, or unspecified chronic kidney disease: Secondary | ICD-10-CM | POA: Diagnosis not present

## 2016-10-02 DIAGNOSIS — Z6828 Body mass index (BMI) 28.0-28.9, adult: Secondary | ICD-10-CM | POA: Diagnosis not present

## 2016-10-02 DIAGNOSIS — M79661 Pain in right lower leg: Secondary | ICD-10-CM | POA: Diagnosis not present

## 2016-10-02 DIAGNOSIS — F419 Anxiety disorder, unspecified: Secondary | ICD-10-CM | POA: Diagnosis not present

## 2016-10-03 DIAGNOSIS — M79661 Pain in right lower leg: Secondary | ICD-10-CM | POA: Diagnosis not present

## 2016-10-14 DIAGNOSIS — H353222 Exudative age-related macular degeneration, left eye, with inactive choroidal neovascularization: Secondary | ICD-10-CM | POA: Diagnosis not present

## 2016-10-14 DIAGNOSIS — H35363 Drusen (degenerative) of macula, bilateral: Secondary | ICD-10-CM | POA: Diagnosis not present

## 2016-10-14 DIAGNOSIS — H353123 Nonexudative age-related macular degeneration, left eye, advanced atrophic without subfoveal involvement: Secondary | ICD-10-CM | POA: Diagnosis not present

## 2016-10-14 DIAGNOSIS — H35351 Cystoid macular degeneration, right eye: Secondary | ICD-10-CM | POA: Diagnosis not present

## 2016-10-14 DIAGNOSIS — H3570 Unspecified separation of retinal layers: Secondary | ICD-10-CM | POA: Diagnosis not present

## 2016-10-14 DIAGNOSIS — H353212 Exudative age-related macular degeneration, right eye, with inactive choroidal neovascularization: Secondary | ICD-10-CM | POA: Diagnosis not present

## 2016-10-21 DIAGNOSIS — I10 Essential (primary) hypertension: Secondary | ICD-10-CM | POA: Diagnosis not present

## 2016-10-21 DIAGNOSIS — M859 Disorder of bone density and structure, unspecified: Secondary | ICD-10-CM | POA: Diagnosis not present

## 2016-10-21 DIAGNOSIS — E784 Other hyperlipidemia: Secondary | ICD-10-CM | POA: Diagnosis not present

## 2016-10-21 DIAGNOSIS — E041 Nontoxic single thyroid nodule: Secondary | ICD-10-CM | POA: Diagnosis not present

## 2016-10-27 DIAGNOSIS — H353113 Nonexudative age-related macular degeneration, right eye, advanced atrophic without subfoveal involvement: Secondary | ICD-10-CM | POA: Diagnosis not present

## 2016-10-27 DIAGNOSIS — H35351 Cystoid macular degeneration, right eye: Secondary | ICD-10-CM | POA: Diagnosis not present

## 2016-10-27 DIAGNOSIS — H353222 Exudative age-related macular degeneration, left eye, with inactive choroidal neovascularization: Secondary | ICD-10-CM | POA: Diagnosis not present

## 2016-10-27 DIAGNOSIS — H43812 Vitreous degeneration, left eye: Secondary | ICD-10-CM | POA: Diagnosis not present

## 2016-10-27 DIAGNOSIS — H353212 Exudative age-related macular degeneration, right eye, with inactive choroidal neovascularization: Secondary | ICD-10-CM | POA: Diagnosis not present

## 2016-10-28 DIAGNOSIS — Z0000 Encounter for general adult medical examination without abnormal findings: Secondary | ICD-10-CM | POA: Diagnosis not present

## 2016-10-28 DIAGNOSIS — I10 Essential (primary) hypertension: Secondary | ICD-10-CM | POA: Diagnosis not present

## 2016-10-28 DIAGNOSIS — Z1389 Encounter for screening for other disorder: Secondary | ICD-10-CM | POA: Diagnosis not present

## 2016-10-28 DIAGNOSIS — E038 Other specified hypothyroidism: Secondary | ICD-10-CM | POA: Diagnosis not present

## 2016-10-28 DIAGNOSIS — M545 Low back pain: Secondary | ICD-10-CM | POA: Diagnosis not present

## 2016-10-28 DIAGNOSIS — M859 Disorder of bone density and structure, unspecified: Secondary | ICD-10-CM | POA: Diagnosis not present

## 2016-10-28 DIAGNOSIS — M79661 Pain in right lower leg: Secondary | ICD-10-CM | POA: Diagnosis not present

## 2016-10-28 DIAGNOSIS — E784 Other hyperlipidemia: Secondary | ICD-10-CM | POA: Diagnosis not present

## 2016-10-28 DIAGNOSIS — G4709 Other insomnia: Secondary | ICD-10-CM | POA: Diagnosis not present

## 2016-10-28 DIAGNOSIS — Z7901 Long term (current) use of anticoagulants: Secondary | ICD-10-CM | POA: Diagnosis not present

## 2016-10-28 DIAGNOSIS — I129 Hypertensive chronic kidney disease with stage 1 through stage 4 chronic kidney disease, or unspecified chronic kidney disease: Secondary | ICD-10-CM | POA: Diagnosis not present

## 2016-10-28 DIAGNOSIS — Z6828 Body mass index (BMI) 28.0-28.9, adult: Secondary | ICD-10-CM | POA: Diagnosis not present

## 2016-10-31 ENCOUNTER — Ambulatory Visit (INDEPENDENT_AMBULATORY_CARE_PROVIDER_SITE_OTHER): Payer: Medicare Other | Admitting: *Deleted

## 2016-10-31 DIAGNOSIS — Z5181 Encounter for therapeutic drug level monitoring: Secondary | ICD-10-CM | POA: Diagnosis not present

## 2016-10-31 DIAGNOSIS — I4891 Unspecified atrial fibrillation: Secondary | ICD-10-CM | POA: Diagnosis not present

## 2016-10-31 DIAGNOSIS — Z7901 Long term (current) use of anticoagulants: Secondary | ICD-10-CM

## 2016-10-31 LAB — POCT INR: INR: 3.6

## 2016-11-06 ENCOUNTER — Other Ambulatory Visit: Payer: Self-pay | Admitting: *Deleted

## 2016-11-06 ENCOUNTER — Other Ambulatory Visit: Payer: Self-pay | Admitting: Cardiology

## 2016-11-14 ENCOUNTER — Ambulatory Visit (INDEPENDENT_AMBULATORY_CARE_PROVIDER_SITE_OTHER): Payer: Medicare Other

## 2016-11-14 DIAGNOSIS — Z5181 Encounter for therapeutic drug level monitoring: Secondary | ICD-10-CM | POA: Diagnosis not present

## 2016-11-14 DIAGNOSIS — I4891 Unspecified atrial fibrillation: Secondary | ICD-10-CM | POA: Diagnosis not present

## 2016-11-14 DIAGNOSIS — Z7901 Long term (current) use of anticoagulants: Secondary | ICD-10-CM | POA: Diagnosis not present

## 2016-11-14 LAB — POCT INR: INR: 2.4

## 2016-12-01 DIAGNOSIS — H353211 Exudative age-related macular degeneration, right eye, with active choroidal neovascularization: Secondary | ICD-10-CM | POA: Diagnosis not present

## 2016-12-01 DIAGNOSIS — H3570 Unspecified separation of retinal layers: Secondary | ICD-10-CM | POA: Diagnosis not present

## 2016-12-01 DIAGNOSIS — H353212 Exudative age-related macular degeneration, right eye, with inactive choroidal neovascularization: Secondary | ICD-10-CM | POA: Diagnosis not present

## 2016-12-01 DIAGNOSIS — H35351 Cystoid macular degeneration, right eye: Secondary | ICD-10-CM | POA: Diagnosis not present

## 2016-12-09 DIAGNOSIS — I10 Essential (primary) hypertension: Secondary | ICD-10-CM | POA: Diagnosis not present

## 2016-12-12 ENCOUNTER — Ambulatory Visit (INDEPENDENT_AMBULATORY_CARE_PROVIDER_SITE_OTHER): Payer: Medicare Other

## 2016-12-12 DIAGNOSIS — Z5181 Encounter for therapeutic drug level monitoring: Secondary | ICD-10-CM | POA: Diagnosis not present

## 2016-12-12 DIAGNOSIS — I4891 Unspecified atrial fibrillation: Secondary | ICD-10-CM | POA: Diagnosis not present

## 2016-12-12 DIAGNOSIS — I10 Essential (primary) hypertension: Secondary | ICD-10-CM | POA: Diagnosis not present

## 2016-12-12 LAB — POCT INR: INR: 4.1

## 2016-12-18 ENCOUNTER — Encounter (INDEPENDENT_AMBULATORY_CARE_PROVIDER_SITE_OTHER): Payer: Self-pay | Admitting: Orthopedic Surgery

## 2016-12-18 ENCOUNTER — Ambulatory Visit (INDEPENDENT_AMBULATORY_CARE_PROVIDER_SITE_OTHER): Payer: Self-pay

## 2016-12-18 ENCOUNTER — Ambulatory Visit (INDEPENDENT_AMBULATORY_CARE_PROVIDER_SITE_OTHER): Payer: Medicare Other | Admitting: Orthopedic Surgery

## 2016-12-18 ENCOUNTER — Ambulatory Visit (INDEPENDENT_AMBULATORY_CARE_PROVIDER_SITE_OTHER): Payer: Medicare Other

## 2016-12-18 VITALS — BP 146/68 | HR 80 | Resp 18 | Ht 61.5 in | Wt 155.0 lb

## 2016-12-18 DIAGNOSIS — M1711 Unilateral primary osteoarthritis, right knee: Secondary | ICD-10-CM

## 2016-12-18 DIAGNOSIS — M25561 Pain in right knee: Secondary | ICD-10-CM

## 2016-12-18 MED ORDER — LIDOCAINE HCL 1 % IJ SOLN
3.0000 mL | INTRAMUSCULAR | Status: AC | PRN
  Administered 2016-12-18: 3 mL

## 2016-12-18 MED ORDER — BUPIVACAINE HCL 0.5 % IJ SOLN
3.0000 mL | INTRAMUSCULAR | Status: AC | PRN
  Administered 2016-12-18: 3 mL via INTRA_ARTICULAR

## 2016-12-18 MED ORDER — METHYLPREDNISOLONE ACETATE 40 MG/ML IJ SUSP
80.0000 mg | INTRAMUSCULAR | Status: AC | PRN
  Administered 2016-12-18: 80 mg

## 2016-12-18 NOTE — Progress Notes (Signed)
Office Visit Note   Patient: Sylvia [name]           Date of Birth: Jul 24, 1932           MRN: [id number] Visit Date: 12/18/2016              Requested by: Leanna Battles, MD [address] [place], [place] [address] PCP: Leanna Battles, MD   Assessment & Plan: Visit Diagnoses:  1. Right knee pain, unspecified chronicity   2. Acute pain of right knee   3. Unilateral primary osteoarthritis, right knee     Plan:  #1: Corticosteroid injection to the right knee relieved all of her symptoms. #2: If she continues to have pain we could possibly do an MRI scan to evaluate.  Follow-Up Instructions: Return if symptoms worsen or fail to improve.   Orders:  Orders Placed This Encounter  Procedures  . XR Knee Complete 4 Views Right   No orders of the defined types were placed in this encounter.     Procedures: Large Joint Inj Date/Time: 12/18/2016 12:23 PM Performed by: Biagio Borg D Authorized by: Biagio Borg D   Consent Given by:  Patient Timeout: prior to procedure the correct patient, procedure, and site was verified   Indications:  Pain and joint swelling Location:  Knee Site:  R knee Prep: patient was prepped and draped in usual sterile fashion   Needle Size:  25 G Needle Length:  1.5 inches Approach:  Anteromedial Ultrasound Guidance: No   Fluoroscopic Guidance: No   Arthrogram: No   Medications:  80 mg methylPREDNISolone acetate 40 MG/ML; 3 mL bupivacaine 0.5 %; 3 mL lidocaine 1 % Aspiration Attempted: No   Patient tolerance:  Patient tolerated the procedure well with no immediate complications     Clinical Data: No additional findings.   Subjective: Chief Complaint  Patient presents with  . Right Knee - Cyst    Sylvia [name] is an 83 y o that presents with a Right knee Bakers Cyst. Doppler performed at [facility] in July. Denies injury, pain and swelling. Suspected Baker's cyst. Hx of A-fib    Sylvia [name] is a very pleasant 83 year old white  female who presents with right knee pain. This dates back to June of this year when she did have a Doppler of the right leg to rule out DVT on 10/03/2016. This was normal. She however is having a noting of a small fluid collection within the popliteal fossa measuring 3.9 0.6 x 1.2 cm with mild edema at the ankle. She did not remember history of injury or trauma. However her knee does continue to give her pain and discomfort posteriorly as well as anteriorly. She denies any symptoms of giving way but she does have some catching type symptoms. Complains of some swelling of the knee. Denies any groin pain. She is on Coumadin         Review of Systems  Constitutional: Positive for fatigue. Negative for chills and fever.  Eyes: Negative for itching.  Respiratory: Positive for shortness of breath. Negative for chest tightness.   Cardiovascular: Negative for chest pain, palpitations and leg swelling.  Gastrointestinal: Negative for blood in stool, constipation and diarrhea.  Musculoskeletal: Negative for back pain, joint swelling, neck pain and neck stiffness.  Neurological: Negative for dizziness, weakness, numbness and headaches.  Hematological: Does not bruise/bleed easily.  Psychiatric/Behavioral: Negative for sleep disturbance. The patient is not nervous/anxious.      Objective: Vital Signs: BP (!) 146/68  Pulse 80   Resp 18   Ht 5' 1.5" (1.562 m)   Wt 155 lb (70.3 kg)   BMI 28.81 kg/m   Physical Exam  Constitutional: She is oriented to person, place, and time. She appears well-developed and well-nourished.  HENT:  Head: Normocephalic and atraumatic.  Eyes: Pupils are equal, round, and reactive to light. EOM are normal.  Pulmonary/Chest: Effort normal.  Neurological: She is alert and oriented to person, place, and time.  Skin: Skin is warm and dry.  Psychiatric: She has a normal mood and affect. Her behavior is normal. Judgment and thought content normal.    Ortho Exam    Today she has a trace effusion. Range of motion Shive 5 to that about 100. She has diffuse tenderness more on the lateral joint line. She does this. Palpation posteriorly as well as posterior lateral. No pain with internal and external rotation of the hip. Calf is supple and not particular painful.  Specialty Comments:  No specialty comments available.  Imaging: Xr Knee Complete 4 Views Right  Result Date: 12/18/2016 4 view x-ray of the right knee reveals lateral compartment joint space narrowing. There is prominence of the lateral proximal tibial plateau appears as if she is translating of the tibia laterally on the femur. She does have patellofemoral spurring periarticular as well as sclerosing tibial plateau.    PMFS History: Patient Active Problem List   Diagnosis Date Noted  . Encounter for therapeutic drug monitoring 05/24/2013  . Long term (current) use of anticoagulants 07/26/2010  . FATIGUE 05/23/2010  . HYPERCHOLESTEROLEMIA, PURE 02/13/2008  . HYPERTENSION, BENIGN 02/13/2008  . SVT/ PSVT/ PAT 02/13/2008  . ATRIAL FIBRILLATION 02/13/2008  . GERD 02/13/2008  . PALPITATIONS 02/13/2008   Past Medical History:  Diagnosis Date  . Atrial fibrillation ([place])    ATRIAL FIBRILLATION S/P  PVI 6/11  . CAD (coronary artery disease)    a. 03/2002: cath showing 20% OM1 stenosis, EF at 65%, no WMA. No significant CAD.   Marland Kitchen GERD (gastroesophageal reflux disease)   . Hyperlipidemia   . Hypertension   . Macular degeneration   . SVT (supraventricular tachycardia) ([facility])     Family History  Problem Relation Age of Onset  . Stroke Other   . Coronary artery disease Other     Past Surgical History:  Procedure Laterality Date  . ABLATION OF DYSRHYTHMIC FOCUS  09/2009   s/p PVI by JA  . TOTAL ABDOMINAL HYSTERECTOMY     Social History   Occupational History  . Not on file.   Social History Main Topics  . Smoking status: Never Smoker  . Smokeless tobacco: Never Used  . Alcohol  use No  . Drug use: No  . Sexual activity: Not on file

## 2016-12-24 ENCOUNTER — Telehealth (INDEPENDENT_AMBULATORY_CARE_PROVIDER_SITE_OTHER): Payer: Self-pay | Admitting: Orthopaedic Surgery

## 2016-12-24 NOTE — Telephone Encounter (Signed)
Please advise 

## 2016-12-24 NOTE — Telephone Encounter (Signed)
Patient calling in reference to [facility] Cyst/ knee pain. Per patient injection has not taken care of pain. Patient would like to know what to do next. Please call to advise.

## 2016-12-25 ENCOUNTER — Other Ambulatory Visit (INDEPENDENT_AMBULATORY_CARE_PROVIDER_SITE_OTHER): Payer: Self-pay

## 2016-12-25 DIAGNOSIS — G8929 Other chronic pain: Secondary | ICD-10-CM

## 2016-12-25 DIAGNOSIS — M25561 Pain in right knee: Principal | ICD-10-CM

## 2016-12-25 NOTE — Telephone Encounter (Signed)
Please schedule MRI and call her to tell her that is our plan...thanks

## 2016-12-26 ENCOUNTER — Ambulatory Visit (INDEPENDENT_AMBULATORY_CARE_PROVIDER_SITE_OTHER): Payer: Medicare Other | Admitting: Pharmacist

## 2016-12-26 DIAGNOSIS — Z5181 Encounter for therapeutic drug level monitoring: Secondary | ICD-10-CM | POA: Diagnosis not present

## 2016-12-26 DIAGNOSIS — I4891 Unspecified atrial fibrillation: Secondary | ICD-10-CM | POA: Diagnosis not present

## 2016-12-26 LAB — POCT INR: INR: 4.3

## 2017-01-05 ENCOUNTER — Ambulatory Visit (INDEPENDENT_AMBULATORY_CARE_PROVIDER_SITE_OTHER): Payer: Medicare Other | Admitting: *Deleted

## 2017-01-05 DIAGNOSIS — Z5181 Encounter for therapeutic drug level monitoring: Secondary | ICD-10-CM | POA: Diagnosis not present

## 2017-01-05 DIAGNOSIS — I4891 Unspecified atrial fibrillation: Secondary | ICD-10-CM | POA: Diagnosis not present

## 2017-01-05 DIAGNOSIS — I471 Supraventricular tachycardia: Secondary | ICD-10-CM | POA: Diagnosis not present

## 2017-01-05 LAB — POCT INR: INR: 2.1

## 2017-01-06 DIAGNOSIS — H353211 Exudative age-related macular degeneration, right eye, with active choroidal neovascularization: Secondary | ICD-10-CM | POA: Diagnosis not present

## 2017-01-06 DIAGNOSIS — H3570 Unspecified separation of retinal layers: Secondary | ICD-10-CM | POA: Diagnosis not present

## 2017-01-06 DIAGNOSIS — H35351 Cystoid macular degeneration, right eye: Secondary | ICD-10-CM | POA: Diagnosis not present

## 2017-01-06 DIAGNOSIS — H353212 Exudative age-related macular degeneration, right eye, with inactive choroidal neovascularization: Secondary | ICD-10-CM | POA: Diagnosis not present

## 2017-01-16 ENCOUNTER — Ambulatory Visit: Payer: Medicare Other | Admitting: Cardiology

## 2017-01-23 ENCOUNTER — Ambulatory Visit
Admission: RE | Admit: 2017-01-23 | Discharge: 2017-01-23 | Disposition: A | Discharge status: Home / Self Care | Payer: Medicare Other | Source: Ambulatory Visit | Attending: Orthopedic Surgery | Admitting: Orthopedic Surgery

## 2017-01-23 ENCOUNTER — Ambulatory Visit (INDEPENDENT_AMBULATORY_CARE_PROVIDER_SITE_OTHER): Payer: Medicare Other | Admitting: *Deleted

## 2017-01-23 DIAGNOSIS — Z5181 Encounter for therapeutic drug level monitoring: Secondary | ICD-10-CM

## 2017-01-23 DIAGNOSIS — I4891 Unspecified atrial fibrillation: Secondary | ICD-10-CM | POA: Diagnosis not present

## 2017-01-23 DIAGNOSIS — M25561 Pain in right knee: Secondary | ICD-10-CM | POA: Diagnosis not present

## 2017-01-23 DIAGNOSIS — G8929 Other chronic pain: Secondary | ICD-10-CM

## 2017-01-23 LAB — POCT INR: INR: 3.6

## 2017-01-27 ENCOUNTER — Telehealth: Payer: Self-pay

## 2017-01-27 ENCOUNTER — Other Ambulatory Visit (INDEPENDENT_AMBULATORY_CARE_PROVIDER_SITE_OTHER): Payer: Self-pay | Admitting: Orthopedic Surgery

## 2017-01-27 MED ORDER — TRAMADOL HCL 50 MG PO TABS: 50.0000 mg | ORAL_TABLET | Freq: Three times a day (TID) | ORAL | 0 refills | Status: DC | PRN

## 2017-01-27 NOTE — Telephone Encounter (Signed)
Patient husband Delrae Alfred called stating that patient is in a lot of pain with her right knee and right leg.  Currently taking Tylenol, but it is not helping. Would like something for pain?  Would like to know about MRI results?  CB# is [phone].  Please advise.  Thank You.

## 2017-01-27 NOTE — Telephone Encounter (Signed)
Please advise 

## 2017-01-28 ENCOUNTER — Other Ambulatory Visit (INDEPENDENT_AMBULATORY_CARE_PROVIDER_SITE_OTHER): Payer: Self-pay | Admitting: Orthopedic Surgery

## 2017-01-28 NOTE — Telephone Encounter (Signed)
TY the fax machine is trash

## 2017-01-28 NOTE — Telephone Encounter (Signed)
I know.  It's a shame that we can't have a system that can [employer]

## 2017-01-28 NOTE — Telephone Encounter (Signed)
Patient's husband called stating that the pharmacy has not received the prescription for Tramadol.

## 2017-01-28 NOTE — Telephone Encounter (Signed)
Spoke to him and he requested pain medicine. Prescription for tramadol was sent in. They will follow up with Dr. Durward Fortes for further discussion

## 2017-01-28 NOTE — Telephone Encounter (Signed)
I called it in.  Guess it didn't go through last night when you sent it.

## 2017-01-29 ENCOUNTER — Ambulatory Visit (INDEPENDENT_AMBULATORY_CARE_PROVIDER_SITE_OTHER): Payer: Medicare Other | Admitting: Orthopaedic Surgery

## 2017-01-29 ENCOUNTER — Encounter (INDEPENDENT_AMBULATORY_CARE_PROVIDER_SITE_OTHER): Payer: Self-pay | Admitting: Orthopaedic Surgery

## 2017-01-29 ENCOUNTER — Telehealth (INDEPENDENT_AMBULATORY_CARE_PROVIDER_SITE_OTHER): Payer: Self-pay | Admitting: Orthopaedic Surgery

## 2017-01-29 VITALS — BP 138/82 | HR 71 | Resp 12 | Ht 63.0 in | Wt 145.0 lb

## 2017-01-29 DIAGNOSIS — Z6828 Body mass index (BMI) 28.0-28.9, adult: Secondary | ICD-10-CM | POA: Diagnosis not present

## 2017-01-29 DIAGNOSIS — G8929 Other chronic pain: Secondary | ICD-10-CM | POA: Diagnosis not present

## 2017-01-29 DIAGNOSIS — I10 Essential (primary) hypertension: Secondary | ICD-10-CM | POA: Diagnosis not present

## 2017-01-29 DIAGNOSIS — I482 Chronic atrial fibrillation: Secondary | ICD-10-CM | POA: Diagnosis not present

## 2017-01-29 DIAGNOSIS — Z23 Encounter for immunization: Secondary | ICD-10-CM | POA: Diagnosis not present

## 2017-01-29 DIAGNOSIS — M25561 Pain in right knee: Secondary | ICD-10-CM

## 2017-01-29 DIAGNOSIS — E038 Other specified hypothyroidism: Secondary | ICD-10-CM | POA: Diagnosis not present

## 2017-01-29 NOTE — Telephone Encounter (Signed)
done

## 2017-01-29 NOTE — Progress Notes (Signed)
Office Visit Note   Patient: Sylvia [name]           Date of Birth: Nov 20, 1932           MRN: [id number] Visit Date: 01/29/2017              Requested by: Leanna Battles, MD [place], [place] [address] PCP: Leanna Battles, MD   Assessment & Plan: Visit Diagnoses:  1. Chronic pain of right knee     Plan: MRI scan demonstrates a horizontal tear of the posterior horn and body junction of the medial meniscus. The lateral meniscus is macerated . High-grade partial-thickness cartilage loss with areas of full-thickness cartilage loss at the patellofemoral compartment and lateral compartment. There are high grade partial thickness cartilage loss of the medial compartment. Small Baker's cyst. Long discussion with Sylvia [name] and her husband regarding the findings. Think it might be worthwhile to consider Visco supplementation. She's very limited in her ability to take NSAIDs as she is on Coumadin. Office 2 weeks  Follow-Up Instructions: Return in about 2 weeks (around 02/12/2017).   Orders:  No orders of the defined types were placed in this encounter.  No orders of the defined types were placed in this encounter.     Procedures: No procedures performed   Clinical Data: No additional findings.   Subjective: Chief Complaint  Patient presents with  . Right Knee - Results    Pt is here for MRI review of Right shoulder  Sylvia [name] is been having trouble with the right knee for "some time". She is on Coumadin and cannot take NSAIDs. She's had for a limited response to intra-articular cortisone injections. She has knee support at home. She's having predominantly lateral joint pain with no history of injury or trauma.  HPI  Review of Systems  Constitutional: Negative for chills, fatigue and fever.  HENT: Positive for hearing loss.   Eyes: Negative for itching.  Respiratory: Negative for chest tightness and shortness of breath.   Cardiovascular: Negative for  chest pain, palpitations and leg swelling.  Gastrointestinal: Negative for blood in stool, constipation and diarrhea.  Endocrine: Negative for polyuria.  Genitourinary: Negative for dysuria.  Musculoskeletal: Negative for back pain, joint swelling, neck pain and neck stiffness.  Allergic/Immunologic: Negative for immunocompromised state.  Neurological: Negative for dizziness and numbness.  Hematological: Does not bruise/bleed easily.  Psychiatric/Behavioral: Positive for sleep disturbance. The patient is not nervous/anxious.      Objective: Vital Signs: BP 138/82   Pulse 71   Resp 12   Ht 5\' 3"  (1.6 m)   Wt 145 lb (65.8 kg)   BMI 25.69 kg/m   Physical Exam  Ortho Exam awake alert and oriented 3. Comfortable sitting position. Minimal effusion right knee. Predominantly lateral joint pain without popping or clicking. Some patellar discomfort and grinding. No medial joint pain. Some fullness in the popliteal space. No calf pain or distal edema. Neurovascular exam intact. Straight leg raise negative. Painless range of motion both hips  Specialty Comments:  No specialty comments available.  Imaging: No results found.   PMFS History: Patient Active Problem List   Diagnosis Date Noted  . Encounter for therapeutic drug monitoring 05/24/2013  . Long term (current) use of anticoagulants 07/26/2010  . FATIGUE 05/23/2010  . HYPERCHOLESTEROLEMIA, PURE 02/13/2008  . HYPERTENSION, BENIGN 02/13/2008  . SVT/ PSVT/ PAT 02/13/2008  . ATRIAL FIBRILLATION 02/13/2008  . GERD 02/13/2008  . PALPITATIONS 02/13/2008   Past Medical History:  Diagnosis  Date  . Atrial fibrillation ([place])    ATRIAL FIBRILLATION S/P  PVI 6/11  . CAD (coronary artery disease)    a. 03/2002: cath showing 20% OM1 stenosis, EF at 65%, no WMA. No significant CAD.   Marland Kitchen GERD (gastroesophageal reflux disease)   . Hyperlipidemia   . Hypertension   . Macular degeneration   . SVT (supraventricular tachycardia) (HCC)       Family History  Problem Relation Age of Onset  . Stroke Other   . Coronary artery disease Other     Past Surgical History:  Procedure Laterality Date  . ABLATION OF DYSRHYTHMIC FOCUS  09/2009   s/p PVI by JA  . TOTAL ABDOMINAL HYSTERECTOMY     Social History   Occupational History  . Not on file.   Social History Main Topics  . Smoking status: Never Smoker  . Smokeless tobacco: Never Used  . Alcohol use No  . Drug use: No  . Sexual activity: Not on file

## 2017-01-29 NOTE — Telephone Encounter (Signed)
Patient requesting a copy of MRI report to be sent to Dr. Sharlett Iles.

## 2017-01-30 ENCOUNTER — Ambulatory Visit (INDEPENDENT_AMBULATORY_CARE_PROVIDER_SITE_OTHER): Payer: PRIVATE HEALTH INSURANCE | Admitting: Orthopaedic Surgery

## 2017-02-02 ENCOUNTER — Ambulatory Visit (INDEPENDENT_AMBULATORY_CARE_PROVIDER_SITE_OTHER): Payer: PRIVATE HEALTH INSURANCE | Admitting: Orthopaedic Surgery

## 2017-02-02 NOTE — Progress Notes (Signed)
HPI: FU permanent atrial fibrillation and SVT. Last echocardiogram in June of 2011 showed normal LV function. There was trivial aortic and mitral regurgitation. Also note she had a cardiac catheterization in December 2003 that showed a 20% first obtuse marginal but otherwise no obstructive disease. She was referred for atrial fibrillation ablation. She had this procedure in June of 2011. Following the procedure she did have recurrent atrial fibrillation. She was seen by Dr. Rayann Heman and consideration of repeat ablation was discussed. However the decision was made for rate control and anticoagulation. 24-hour Holter monitor in December 2015 showed rate was controlled. Nuclear study April 2018 showed ejection fraction 56% and no ischemia. Since I last saw her the patient has dyspnea with more extreme activities but not with routine activities. It is relieved with rest. It is not associated with chest pain. There is no orthopnea, PND or pedal edema. There is no syncope or palpitations. There is no exertional chest pain.   Current Outpatient Prescriptions  Medication Sig Dispense Refill  . ALPRAZolam (XANAX) 0.5 MG tablet     . calcium citrate-vitamin D (CITRACAL+D) 315-200 MG-UNIT per tablet Take 1 tablet by mouth daily.      . fish oil-omega-3 fatty acids 1000 MG capsule Take 1 g by mouth 2 (two) times daily.     . fluocinonide (LIDEX) 0.05 % external solution 2 (two) times daily. 1-2 GTTS IN EARS TWICE WEEKLY AS NEEDED     . levothyroxine (SYNTHROID, LEVOTHROID) 75 MCG tablet Take 75 mcg by mouth daily.      Marland Kitchen losartan (COZAAR) 100 MG tablet Take 100 mg by mouth daily.      . Multiple Vitamins-Minerals (OCUVITE PRESERVISION PO) Take 1 tablet by mouth 2 (two) times daily.    . pantoprazole (PROTONIX) 40 MG tablet Take 40 mg by mouth 2 (two) times daily.    . polyethylene glycol powder (GLYCOLAX/MIRALAX) powder Take 17 g by mouth daily. PRN      . raloxifene (EVISTA) 60 MG tablet Take 60 mg by  mouth daily.      . traMADol (ULTRAM) 50 MG tablet Take 1 tablet (50 mg total) by mouth every 8 (eight) hours as needed. 20 tablet 0  . verapamil (CALAN-SR) 180 MG CR tablet TAKE ONE TABLET BY MOUTH TWICE DAILY 180 tablet 2  . warfarin (COUMADIN) 5 MG tablet Take as directed by coumadin clinic 100 tablet 1   No current facility-administered medications for this visit.      Past Medical History:  Diagnosis Date  . Atrial fibrillation ([place])    ATRIAL FIBRILLATION S/P  PVI 6/11  . CAD (coronary artery disease)    a. 03/2002: cath showing 20% OM1 stenosis, EF at 65%, no WMA. No significant CAD.   Marland Kitchen GERD (gastroesophageal reflux disease)   . Hyperlipidemia   . Hypertension   . Macular degeneration   . SVT (supraventricular tachycardia) (HCC)     Past Surgical History:  Procedure Laterality Date  . ABLATION OF DYSRHYTHMIC FOCUS  09/2009   s/p PVI by JA  . TOTAL ABDOMINAL HYSTERECTOMY      Social History   Social History  . Marital status: Married    Spouse name: N/A  . Number of children: N/A  . Years of education: N/A   Occupational History  . Not on file.   Social History Main Topics  . Smoking status: Never Smoker  . Smokeless tobacco: Never Used  . Alcohol use No  .  Drug use: No  . Sexual activity: Not on file   Other Topics Concern  . Not on file   Social History Narrative   Lives with spouse    Family History  Problem Relation Age of Onset  . Stroke Other   . Coronary artery disease Other     ROS: fatigue but no fevers or chills, productive cough, hemoptysis, dysphasia, odynophagia, melena, hematochezia, dysuria, hematuria, rash, seizure activity, orthopnea, PND, pedal edema, claudication. Remaining systems are negative.  Physical Exam: Well-developed well-nourished in no acute distress.  Skin is warm and dry.  HEENT is normal.  Neck is supple.  Chest is clear to auscultation with normal expansion.  Cardiovascular exam is irregular Abdominal exam  nontender or distended. No masses palpated. Extremities show no edema. neuro grossly intact   A/P  1 Permanent atrial fibrillation-patient doing well from a symptomatic standpoint. Continue verapamil for rate control. Continue Coumadin. Hgb monitored by primary care.  2 hypertension-blood pressure is controlled. Continue present medications.  3 history of supraventricular tachycardia-continue verapamil. No recent episodes.   Kirk Ruths, MD

## 2017-02-10 ENCOUNTER — Ambulatory Visit (INDEPENDENT_AMBULATORY_CARE_PROVIDER_SITE_OTHER): Payer: Medicare Other | Admitting: Cardiology

## 2017-02-10 ENCOUNTER — Encounter: Payer: Self-pay | Admitting: Cardiology

## 2017-02-10 ENCOUNTER — Ambulatory Visit (INDEPENDENT_AMBULATORY_CARE_PROVIDER_SITE_OTHER): Payer: Medicare Other | Admitting: Pharmacist

## 2017-02-10 VITALS — BP 140/60 | HR 88 | Ht 61.5 in | Wt 152.6 lb

## 2017-02-10 DIAGNOSIS — I471 Supraventricular tachycardia: Secondary | ICD-10-CM | POA: Diagnosis not present

## 2017-02-10 DIAGNOSIS — I10 Essential (primary) hypertension: Secondary | ICD-10-CM

## 2017-02-10 DIAGNOSIS — I482 Chronic atrial fibrillation: Secondary | ICD-10-CM

## 2017-02-10 DIAGNOSIS — I4891 Unspecified atrial fibrillation: Secondary | ICD-10-CM

## 2017-02-10 DIAGNOSIS — I4821 Permanent atrial fibrillation: Secondary | ICD-10-CM

## 2017-02-10 DIAGNOSIS — Z5181 Encounter for therapeutic drug level monitoring: Secondary | ICD-10-CM | POA: Diagnosis not present

## 2017-02-10 LAB — POCT INR: INR: 2.5

## 2017-02-10 NOTE — Patient Instructions (Signed)
Your physician wants you to follow-up in: ONE YEAR WITH DR CRENSHAW You will receive a reminder letter in the mail two months in advance. If you don't receive a letter, please call our office to schedule the follow-up appointment.   If you need a refill on your cardiac medications before your next appointment, please call your pharmacy.  

## 2017-02-13 ENCOUNTER — Ambulatory Visit (INDEPENDENT_AMBULATORY_CARE_PROVIDER_SITE_OTHER): Payer: PRIVATE HEALTH INSURANCE | Admitting: Orthopaedic Surgery

## 2017-02-16 ENCOUNTER — Ambulatory Visit (INDEPENDENT_AMBULATORY_CARE_PROVIDER_SITE_OTHER): Payer: Medicare Other | Admitting: Orthopaedic Surgery

## 2017-02-16 ENCOUNTER — Encounter (INDEPENDENT_AMBULATORY_CARE_PROVIDER_SITE_OTHER): Payer: Self-pay | Admitting: Orthopaedic Surgery

## 2017-02-16 DIAGNOSIS — M1711 Unilateral primary osteoarthritis, right knee: Secondary | ICD-10-CM | POA: Diagnosis not present

## 2017-02-16 MED ORDER — SODIUM HYALURONATE (VISCOSUP) 20 MG/2ML IX SOSY
20.0000 mg | PREFILLED_SYRINGE | INTRA_ARTICULAR | Status: AC | PRN
  Administered 2017-02-16: 20 mg via INTRA_ARTICULAR

## 2017-02-16 NOTE — Progress Notes (Signed)
   Office Visit Note   Patient: Sylvia [name]           Date of Birth: 06-25-1932           MRN: [id number] Visit Date: 02/16/2017              Requested by: Leanna Battles, MD [place],  [id number] PCP: Leanna Battles, MD   Assessment & Plan: Visit Diagnoses:  1. Unilateral primary osteoarthritis, right knee     Plan: first Euflexxa inj right knee.ffice weekly for the next 2 weeks  Follow-Up Instructions: Return in about 1 week (around 02/23/2017).   Orders:  No orders of the defined types were placed in this encounter.  No orders of the defined types were placed in this encounter.     Procedures: Large Joint Inj: R knee on 02/16/2017 10:51 AM Indications: pain Details: 25 G 1.5 in needle, anterolateral approach  Arthrogram: No  Medications: 20 mg Sodium Hyaluronate 20 MG/2ML      Clinical Data: No additional findings.   Subjective: No chief complaint on file. to start Visco supplementation today  HPI  Review of Systems   Objective: Vital Signs: There were no vitals taken for this visit.  Physical Exam  Ortho Examright knee not hot red or swollen. No effusion. Predominantly lateral joint pain where MRI scan demonstrated loss of articular cartilage area and neurovascular exam intact  Specialty Comments:  No specialty comments available.  Imaging: No results found.   PMFS History: Patient Active Problem List   Diagnosis Date Noted  . Encounter for therapeutic drug monitoring 05/24/2013  . Long term (current) use of anticoagulants 07/26/2010  . FATIGUE 05/23/2010  . HYPERCHOLESTEROLEMIA, PURE 02/13/2008  . HYPERTENSION, BENIGN 02/13/2008  . SVT/ PSVT/ PAT 02/13/2008  . ATRIAL FIBRILLATION 02/13/2008  . GERD 02/13/2008  . PALPITATIONS 02/13/2008   Past Medical History:  Diagnosis Date  . Atrial fibrillation ([place])    ATRIAL FIBRILLATION S/P  PVI 6/11  . CAD (coronary artery disease)    a. 03/2002: cath showing  20% OM1 stenosis, EF at 65%, no WMA. No significant CAD.   Marland Kitchen GERD (gastroesophageal reflux disease)   . Hyperlipidemia   . Hypertension   . Macular degeneration   . SVT (supraventricular tachycardia) (H[facility])     Family History  Problem Relation Age of Onset  . Stroke Other   . Coronary artery disease Other     Past Surgical History:  Procedure Laterality Date  . ABLATION OF DYSRHYTHMIC FOCUS  09/2009   s/p PVI by JA  . TOTAL ABDOMINAL HYSTERECTOMY     Social History   Occupational History  . Not on file  Tobacco Use  . Smoking status: Never Smoker  . Smokeless tobacco: Never Used  Substance and Sexual Activity  . Alcohol use: No  . Drug use: No  . Sexual activity: Not on file     Garald Balding, MD   Note - This record has been created using [employer].  Chart creation errors have been sought, but may not always  have been located. Such creation errors do not reflect on  the standard of medical care.

## 2017-02-17 DIAGNOSIS — H353113 Nonexudative age-related macular degeneration, right eye, advanced atrophic without subfoveal involvement: Secondary | ICD-10-CM | POA: Diagnosis not present

## 2017-02-17 DIAGNOSIS — H35351 Cystoid macular degeneration, right eye: Secondary | ICD-10-CM | POA: Diagnosis not present

## 2017-02-17 DIAGNOSIS — H353221 Exudative age-related macular degeneration, left eye, with active choroidal neovascularization: Secondary | ICD-10-CM | POA: Diagnosis not present

## 2017-02-17 DIAGNOSIS — H353212 Exudative age-related macular degeneration, right eye, with inactive choroidal neovascularization: Secondary | ICD-10-CM | POA: Diagnosis not present

## 2017-02-17 DIAGNOSIS — H3570 Unspecified separation of retinal layers: Secondary | ICD-10-CM | POA: Diagnosis not present

## 2017-02-17 DIAGNOSIS — H353123 Nonexudative age-related macular degeneration, left eye, advanced atrophic without subfoveal involvement: Secondary | ICD-10-CM | POA: Diagnosis not present

## 2017-02-23 ENCOUNTER — Ambulatory Visit (INDEPENDENT_AMBULATORY_CARE_PROVIDER_SITE_OTHER): Payer: Medicare Other | Admitting: Orthopaedic Surgery

## 2017-02-23 ENCOUNTER — Encounter (INDEPENDENT_AMBULATORY_CARE_PROVIDER_SITE_OTHER): Payer: Self-pay | Admitting: Orthopaedic Surgery

## 2017-02-23 DIAGNOSIS — G8929 Other chronic pain: Secondary | ICD-10-CM | POA: Diagnosis not present

## 2017-02-23 DIAGNOSIS — M1711 Unilateral primary osteoarthritis, right knee: Secondary | ICD-10-CM

## 2017-02-23 DIAGNOSIS — M25561 Pain in right knee: Principal | ICD-10-CM

## 2017-02-23 MED ORDER — SODIUM HYALURONATE (VISCOSUP) 20 MG/2ML IX SOSY
20.0000 mg | PREFILLED_SYRINGE | INTRA_ARTICULAR | Status: AC | PRN
  Administered 2017-02-23: 20 mg via INTRA_ARTICULAR

## 2017-02-23 NOTE — Progress Notes (Signed)
   Office Visit Note   Patient: Sylvia [name]           Date of Birth: 11/12/1932           MRN: [id number] Visit Date: 02/23/2017              Requested by: Leanna Battles, MD [place], [place] [address] PCP: Leanna Battles, MD   Assessment & Plan: Visit Diagnoses:  1. Chronic pain of right knee     Plan: Second Euflexxa inj right knee. Follow-up 1 week for third and final injection  Follow-Up Instructions: Return in about 1 week (around 03/02/2017).   Orders:  No orders of the defined types were placed in this encounter.  No orders of the defined types were placed in this encounter.     Procedures: Large Joint Inj: R knee on 02/23/2017 10:17 AM Indications: pain Details: 25 G 1.5 in needle, anterolateral approach  Arthrogram: No  Medications: 20 mg Sodium Hyaluronate 20 MG/2ML      Clinical Data: No additional findings.   Subjective: No chief complaint on file. No significant symptomatic relief with first Visco supplementation injection right knee.  HPI  Review of Systems   Objective: Vital Signs: There were no vitals taken for this visit.  Physical Exam  Ortho Exam right knee not hot warm red or swollen. Predominantly anterolateral joint pain  No specialty comments available.  Imaging: No results found.   PMFS History: Patient Active Problem List   Diagnosis Date Noted  . Encounter for therapeutic drug monitoring 05/24/2013  . Long term (current) use of anticoagulants 07/26/2010  . FATIGUE 05/23/2010  . HYPERCHOLESTEROLEMIA, PURE 02/13/2008  . HYPERTENSION, BENIGN 02/13/2008  . SVT/ PSVT/ PAT 02/13/2008  . ATRIAL FIBRILLATION 02/13/2008  . GERD 02/13/2008  . PALPITATIONS 02/13/2008   Past Medical History:  Diagnosis Date  . Atrial fibrillation ([place])    ATRIAL FIBRILLATION S/P  PVI 6/11  . CAD (coronary artery disease)    a. 03/2002: cath showing 20% OM1 stenosis, EF at 65%, no WMA. No significant CAD.   Marland Kitchen GERD  (gastroesophageal reflux disease)   . Hyperlipidemia   . Hypertension   . Macular degeneration   . SVT (supraventricular tachycardia) (HCC)     Family History  Problem Relation Age of Onset  . Stroke Other   . Coronary artery disease Other     Past Surgical History:  Procedure Laterality Date  . ABLATION OF DYSRHYTHMIC FOCUS  09/2009   s/p PVI by JA  . TOTAL ABDOMINAL HYSTERECTOMY     Social History   Occupational History  . Not on file  Tobacco Use  . Smoking status: Never Smoker  . Smokeless tobacco: Never Used  Substance and Sexual Activity  . Alcohol use: No  . Drug use: No  . Sexual activity: Not on file     Garald Balding, MD   Note - This record has been created using [employer].  Chart creation errors have been sought, but may not always  have been located. Such creation errors do not reflect on  the standard of medical care.

## 2017-03-02 ENCOUNTER — Ambulatory Visit (INDEPENDENT_AMBULATORY_CARE_PROVIDER_SITE_OTHER): Payer: Medicare Other | Admitting: Orthopaedic Surgery

## 2017-03-02 ENCOUNTER — Encounter (INDEPENDENT_AMBULATORY_CARE_PROVIDER_SITE_OTHER): Payer: Self-pay | Admitting: Orthopaedic Surgery

## 2017-03-02 DIAGNOSIS — M1711 Unilateral primary osteoarthritis, right knee: Secondary | ICD-10-CM | POA: Insufficient documentation

## 2017-03-02 MED ORDER — SODIUM HYALURONATE (VISCOSUP) 20 MG/2ML IX SOSY
20.0000 mg | PREFILLED_SYRINGE | INTRA_ARTICULAR | Status: AC | PRN
  Administered 2017-03-02: 20 mg via INTRA_ARTICULAR

## 2017-03-02 NOTE — Progress Notes (Signed)
Office Visit Note   Patient: Sylvia [name]           Date of Birth: 1933-02-02           MRN: [id number] Visit Date: 03/02/2017              Requested by: Leanna Battles, MD [place],  [id number] PCP: Leanna Battles, MD   Assessment & Plan: Visit Diagnoses:  1. Unilateral primary osteoarthritis, right knee     Plan: Surgery flexor injection. Return as needed  Follow-Up Instructions: Return if symptoms worsen or fail to improve.   Orders:  No orders of the defined types were placed in this encounter.  No orders of the defined types were placed in this encounter.     Procedures: Large Joint Inj: R knee on 03/02/2017 11:01 AM Indications: pain and diagnostic evaluation Details: 25 G 1.5 in needle, anteromedial approach  Arthrogram: No  Medications: 20 mg Sodium Hyaluronate 20 MG/2ML Procedure, treatment alternatives, risks and benefits explained, specific risks discussed. Consent was given by the patient. Immediately prior to procedure a time out was called to verify the correct patient, procedure, equipment, support staff and site/side marked as required. Patient was prepped and draped in the usual sterile fashion.       Clinical Data: No additional findings.   Subjective: Chief Complaint  Patient presents with  . Right Knee - Pain  . Knee Pain    Euflexxa # 3, buy and bill, right knee only, some improvement, still having pain    HPI  Review of Systems   Objective: Vital Signs: There were no vitals taken for this visit.  Physical Exam  Ortho Exam right knee without effusion. Full extension and flexes about 100. No instability. Increased valgus with weightbearing. Some patellar crepitation. No swelling distally. Neurovascular exam intact.  Specialty Comments:  No specialty comments available.  Imaging: No results found.   PMFS History: Patient Active Problem List   Diagnosis Date Noted  . Unilateral primary  osteoarthritis, right knee 03/02/2017  . Encounter for therapeutic drug monitoring 05/24/2013  . Long term (current) use of anticoagulants 07/26/2010  . FATIGUE 05/23/2010  . HYPERCHOLESTEROLEMIA, PURE 02/13/2008  . HYPERTENSION, BENIGN 02/13/2008  . SVT/ PSVT/ PAT 02/13/2008  . ATRIAL FIBRILLATION 02/13/2008  . GERD 02/13/2008  . PALPITATIONS 02/13/2008   Past Medical History:  Diagnosis Date  . Atrial fibrillation ([place])    ATRIAL FIBRILLATION S/P  PVI 6/11  . CAD (coronary artery disease)    a. 03/2002: cath showing 20% OM1 stenosis, EF at 65%, no WMA. No significant CAD.   Marland Kitchen GERD (gastroesophageal reflux disease)   . Hyperlipidemia   . Hypertension   . Macular degeneration   . SVT (supraventricular tachycardia) ([facility])     Family History  Problem Relation Age of Onset  . Stroke Other   . Coronary artery disease Other     Past Surgical History:  Procedure Laterality Date  . ABLATION OF DYSRHYTHMIC FOCUS  09/2009   s/p PVI by JA  . TOTAL ABDOMINAL HYSTERECTOMY     Social History   Occupational History  . Not on file  Tobacco Use  . Smoking status: Never Smoker  . Smokeless tobacco: Never Used  Substance and Sexual Activity  . Alcohol use: No  . Drug use: No  . Sexual activity: Not on file     Garald Balding, MD   Note - This record has been created using [employer].  Chart creation errors have been sought, but may not always  have been located. Such creation errors do not reflect on  the standard of medical care.

## 2017-03-06 ENCOUNTER — Other Ambulatory Visit: Payer: Self-pay | Admitting: Cardiology

## 2017-03-09 NOTE — Telephone Encounter (Signed)
REFILL 

## 2017-03-10 ENCOUNTER — Ambulatory Visit (INDEPENDENT_AMBULATORY_CARE_PROVIDER_SITE_OTHER): Payer: Medicare Other | Admitting: Pharmacist

## 2017-03-10 DIAGNOSIS — Z5181 Encounter for therapeutic drug level monitoring: Secondary | ICD-10-CM

## 2017-03-10 DIAGNOSIS — I4891 Unspecified atrial fibrillation: Secondary | ICD-10-CM | POA: Diagnosis not present

## 2017-03-10 LAB — POCT INR: INR: 2.3

## 2017-03-10 NOTE — Patient Instructions (Signed)
Continue 1 tablet daily except 1/2 tablet on Mondays, Wednesdays and Fridays Recheck in 4 weeks 

## 2017-03-25 DIAGNOSIS — H3570 Unspecified separation of retinal layers: Secondary | ICD-10-CM | POA: Diagnosis not present

## 2017-03-25 DIAGNOSIS — H353221 Exudative age-related macular degeneration, left eye, with active choroidal neovascularization: Secondary | ICD-10-CM | POA: Diagnosis not present

## 2017-03-25 DIAGNOSIS — H353212 Exudative age-related macular degeneration, right eye, with inactive choroidal neovascularization: Secondary | ICD-10-CM | POA: Diagnosis not present

## 2017-03-25 DIAGNOSIS — H35351 Cystoid macular degeneration, right eye: Secondary | ICD-10-CM | POA: Diagnosis not present

## 2017-04-09 ENCOUNTER — Ambulatory Visit (INDEPENDENT_AMBULATORY_CARE_PROVIDER_SITE_OTHER): Payer: Medicare Other | Admitting: *Deleted

## 2017-04-09 DIAGNOSIS — Z5181 Encounter for therapeutic drug level monitoring: Secondary | ICD-10-CM | POA: Diagnosis not present

## 2017-04-09 DIAGNOSIS — I4891 Unspecified atrial fibrillation: Secondary | ICD-10-CM

## 2017-04-09 LAB — POCT INR: INR: 1.4

## 2017-04-09 NOTE — Patient Instructions (Signed)
Description   Today take 1.5 tablets and tomorrow take 1 tablet then continue 1 tablet daily except 1/2 tablet on Mondays, Wednesdays and Fridays. Recheck in 1 week.

## 2017-04-16 ENCOUNTER — Ambulatory Visit (INDEPENDENT_AMBULATORY_CARE_PROVIDER_SITE_OTHER): Payer: Medicare Other | Admitting: *Deleted

## 2017-04-16 DIAGNOSIS — I4891 Unspecified atrial fibrillation: Secondary | ICD-10-CM

## 2017-04-16 DIAGNOSIS — Z5181 Encounter for therapeutic drug level monitoring: Secondary | ICD-10-CM

## 2017-04-16 LAB — POCT INR: INR: 1.9

## 2017-04-16 NOTE — Patient Instructions (Signed)
Description   Today January 3rd take 1 and 1/2 tablets (7.5mg ) then change coumadin dose to  1 tablet daily except 1/2 tablet only on Sundays and Wednesdays . Recheck in 2 weeks

## 2017-04-27 DIAGNOSIS — H35351 Cystoid macular degeneration, right eye: Secondary | ICD-10-CM | POA: Diagnosis not present

## 2017-04-27 DIAGNOSIS — H3570 Unspecified separation of retinal layers: Secondary | ICD-10-CM | POA: Diagnosis not present

## 2017-04-27 DIAGNOSIS — H353221 Exudative age-related macular degeneration, left eye, with active choroidal neovascularization: Secondary | ICD-10-CM | POA: Diagnosis not present

## 2017-04-27 DIAGNOSIS — H353212 Exudative age-related macular degeneration, right eye, with inactive choroidal neovascularization: Secondary | ICD-10-CM | POA: Diagnosis not present

## 2017-05-01 ENCOUNTER — Ambulatory Visit (INDEPENDENT_AMBULATORY_CARE_PROVIDER_SITE_OTHER): Payer: Medicare Other | Admitting: *Deleted

## 2017-05-01 DIAGNOSIS — I4891 Unspecified atrial fibrillation: Secondary | ICD-10-CM

## 2017-05-01 DIAGNOSIS — Z5181 Encounter for therapeutic drug level monitoring: Secondary | ICD-10-CM

## 2017-05-01 LAB — POCT INR: INR: 2.2

## 2017-05-01 NOTE — Patient Instructions (Signed)
Description   Continue taking 1 tablet daily except 1/2 tablet only on Sundays and Wednesdays . Recheck in 3 weeks

## 2017-05-22 ENCOUNTER — Encounter (INDEPENDENT_AMBULATORY_CARE_PROVIDER_SITE_OTHER): Payer: Self-pay

## 2017-05-22 ENCOUNTER — Ambulatory Visit (INDEPENDENT_AMBULATORY_CARE_PROVIDER_SITE_OTHER): Payer: Medicare Other | Admitting: Pharmacist

## 2017-05-22 DIAGNOSIS — I4891 Unspecified atrial fibrillation: Secondary | ICD-10-CM | POA: Diagnosis not present

## 2017-05-22 DIAGNOSIS — Z5181 Encounter for therapeutic drug level monitoring: Secondary | ICD-10-CM

## 2017-05-22 LAB — POCT INR: INR: 1.6

## 2017-05-22 NOTE — Patient Instructions (Signed)
Description   Take 1.5 tablets today, then start taking 1 tablet daily except 1/2 tablet only on Wednesdays. Recheck in 2 weeks.

## 2017-05-27 DIAGNOSIS — R682 Dry mouth, unspecified: Secondary | ICD-10-CM | POA: Diagnosis not present

## 2017-05-27 DIAGNOSIS — R1314 Dysphagia, pharyngoesophageal phase: Secondary | ICD-10-CM | POA: Diagnosis not present

## 2017-05-27 DIAGNOSIS — H608X3 Other otitis externa, bilateral: Secondary | ICD-10-CM | POA: Diagnosis not present

## 2017-05-28 ENCOUNTER — Other Ambulatory Visit: Payer: Self-pay | Admitting: Otolaryngology

## 2017-05-28 DIAGNOSIS — R1310 Dysphagia, unspecified: Secondary | ICD-10-CM

## 2017-06-01 ENCOUNTER — Ambulatory Visit
Admission: RE | Admit: 2017-06-01 | Discharge: 2017-06-01 | Disposition: A | Discharge status: Home / Self Care | Payer: Medicare Other | Source: Ambulatory Visit | Attending: Otolaryngology | Admitting: Otolaryngology

## 2017-06-01 DIAGNOSIS — R1310 Dysphagia, unspecified: Secondary | ICD-10-CM | POA: Diagnosis not present

## 2017-06-02 ENCOUNTER — Telehealth: Payer: Self-pay | Admitting: *Deleted

## 2017-06-02 NOTE — Telephone Encounter (Signed)
Husband called to report that pt is feeling sick & having to take OTC meds-Loratadine prn, Mucinex prn, Tylenol Cold & Sinus, also, she was prescribed Tamiflu & a Zpak for 4 days. Pt does not have the Flu per husband & this was called in by pt's PCP since she has cold symptoms, sore throat, & has been feeling bad for a couple days. Advised to call back with any other new meds & to rest & feel better.

## 2017-06-08 ENCOUNTER — Ambulatory Visit (INDEPENDENT_AMBULATORY_CARE_PROVIDER_SITE_OTHER): Payer: Medicare Other | Admitting: *Deleted

## 2017-06-08 DIAGNOSIS — Z5181 Encounter for therapeutic drug level monitoring: Secondary | ICD-10-CM | POA: Diagnosis not present

## 2017-06-08 DIAGNOSIS — I4891 Unspecified atrial fibrillation: Secondary | ICD-10-CM

## 2017-06-08 LAB — POCT INR: INR: 2.6

## 2017-06-09 DIAGNOSIS — H43812 Vitreous degeneration, left eye: Secondary | ICD-10-CM | POA: Diagnosis not present

## 2017-06-09 DIAGNOSIS — H353221 Exudative age-related macular degeneration, left eye, with active choroidal neovascularization: Secondary | ICD-10-CM | POA: Diagnosis not present

## 2017-06-22 ENCOUNTER — Ambulatory Visit (INDEPENDENT_AMBULATORY_CARE_PROVIDER_SITE_OTHER): Payer: Medicare Other | Admitting: Pharmacist

## 2017-06-22 DIAGNOSIS — I4891 Unspecified atrial fibrillation: Secondary | ICD-10-CM | POA: Diagnosis not present

## 2017-06-22 DIAGNOSIS — Z5181 Encounter for therapeutic drug level monitoring: Secondary | ICD-10-CM

## 2017-06-22 LAB — POCT INR: INR: 2.7

## 2017-06-22 NOTE — Patient Instructions (Signed)
Description   Continue taking 1 tablet daily except 1/2 tablet only on Wednesdays. Recheck in 4 weeks. Call us with any medication changes # [phone] [facility].

## 2017-06-26 ENCOUNTER — Other Ambulatory Visit: Payer: Self-pay | Admitting: Cardiology

## 2017-07-14 ENCOUNTER — Encounter (HOSPITAL_COMMUNITY): Payer: Self-pay

## 2017-07-14 ENCOUNTER — Emergency Department (HOSPITAL_COMMUNITY): Payer: Medicare Other

## 2017-07-14 ENCOUNTER — Telehealth: Payer: Self-pay | Admitting: *Deleted

## 2017-07-14 ENCOUNTER — Emergency Department (HOSPITAL_COMMUNITY)
Admission: EM | Admit: 2017-07-14 | Discharge: 2017-07-14 | Disposition: A | Discharge status: Home / Self Care | Payer: Medicare Other | Attending: Emergency Medicine | Admitting: Emergency Medicine

## 2017-07-14 ENCOUNTER — Other Ambulatory Visit: Payer: Self-pay

## 2017-07-14 DIAGNOSIS — R1010 Upper abdominal pain, unspecified: Secondary | ICD-10-CM | POA: Diagnosis not present

## 2017-07-14 DIAGNOSIS — K529 Noninfective gastroenteritis and colitis, unspecified: Secondary | ICD-10-CM | POA: Diagnosis not present

## 2017-07-14 DIAGNOSIS — R112 Nausea with vomiting, unspecified: Secondary | ICD-10-CM | POA: Diagnosis not present

## 2017-07-14 DIAGNOSIS — R000 Tachycardia, unspecified: Secondary | ICD-10-CM | POA: Diagnosis not present

## 2017-07-14 DIAGNOSIS — R1032 Left lower quadrant pain: Secondary | ICD-10-CM | POA: Diagnosis not present

## 2017-07-14 DIAGNOSIS — Z79899 Other long term (current) drug therapy: Secondary | ICD-10-CM | POA: Insufficient documentation

## 2017-07-14 DIAGNOSIS — I10 Essential (primary) hypertension: Secondary | ICD-10-CM | POA: Diagnosis not present

## 2017-07-14 DIAGNOSIS — I2510 Atherosclerotic heart disease of native coronary artery without angina pectoris: Secondary | ICD-10-CM | POA: Insufficient documentation

## 2017-07-14 DIAGNOSIS — R531 Weakness: Secondary | ICD-10-CM | POA: Diagnosis not present

## 2017-07-14 LAB — COMPREHENSIVE METABOLIC PANEL
ALBUMIN: 3.7 g/dL (ref 3.5–5.0)
ALK PHOS: 53 U/L (ref 38–126)
ALT: 15 U/L (ref 14–54)
AST: 23 U/L (ref 15–41)
Anion gap: 13 (ref 5–15)
BILIRUBIN TOTAL: 1.2 mg/dL (ref 0.3–1.2)
BUN: 23 mg/dL — AB (ref 6–20)
CALCIUM: 9.7 mg/dL (ref 8.9–10.3)
CO2: 24 mmol/L (ref 22–32)
Chloride: 101 mmol/L (ref 101–111)
Creatinine, Ser: 0.97 mg/dL (ref 0.44–1.00)
GFR calc Af Amer: >60 mL/min (ref >60)
GFR calc non Af Amer: 53 mL/min — ABNORMAL LOW (ref >60)
GLUCOSE: 161 mg/dL — AB (ref 65–99)
Potassium: 3.1 mmol/L — ABNORMAL LOW (ref 3.5–5.1)
Sodium: 138 mmol/L (ref 135–145)
TOTAL PROTEIN: 7.4 g/dL (ref 6.5–8.1)

## 2017-07-14 LAB — URINALYSIS, ROUTINE W REFLEX MICROSCOPIC
Bilirubin Urine: NEGATIVE
GLUCOSE, UA: NEGATIVE mg/dL
Hgb urine dipstick: NEGATIVE
KETONES UR: 5 mg/dL — AB
NITRITE: NEGATIVE
PROTEIN: 100 mg/dL — AB
Specific Gravity, Urine: 1.02 (ref 1.005–1.030)
pH: 6 (ref 5.0–8.0)

## 2017-07-14 LAB — CBC
HEMATOCRIT: 44.6 % (ref 36.0–46.0)
HEMOGLOBIN: 14.9 g/dL (ref 12.0–15.0)
MCH: 31 pg (ref 26.0–34.0)
MCHC: 33.4 g/dL (ref 30.0–36.0)
MCV: 92.7 fL (ref 78.0–100.0)
Platelets: 222 10*3/uL (ref 150–400)
RBC: 4.81 MIL/uL (ref 3.87–5.11)
RDW: 15.5 % (ref 11.5–15.5)
WBC: 7.6 10*3/uL (ref 4.0–10.5)

## 2017-07-14 LAB — PROTIME-INR
INR: 2.58
Prothrombin Time: 27.5 seconds — ABNORMAL HIGH (ref 11.4–15.2)

## 2017-07-14 LAB — LIPASE, BLOOD: Lipase: 25 U/L (ref 11–51)

## 2017-07-14 MED ORDER — METRONIDAZOLE 500 MG PO TABS
500.0000 mg | ORAL_TABLET | Freq: Once | ORAL | Status: DC
Start: 2017-07-14 — End: 2017-07-14

## 2017-07-14 MED ORDER — CIPROFLOXACIN HCL 500 MG PO TABS: 500.0000 mg | ORAL_TABLET | Freq: Two times a day (BID) | ORAL | 0 refills | Status: DC

## 2017-07-14 MED ORDER — SODIUM CHLORIDE 0.9 % IV BOLUS
1000.0000 mL | Freq: Once | INTRAVENOUS | Status: AC
  Administered 2017-07-14: 1000 mL via INTRAVENOUS

## 2017-07-14 MED ORDER — ONDANSETRON HCL 4 MG/2ML IJ SOLN
4.0000 mg | Freq: Once | INTRAMUSCULAR | Status: AC
  Administered 2017-07-14: 4 mg via INTRAVENOUS
  Filled 2017-07-14: qty 2

## 2017-07-14 MED ORDER — IOPAMIDOL (ISOVUE-300) INJECTION 61%
INTRAVENOUS | Status: AC
  Administered 2017-07-14: 30 mL via ORAL
  Filled 2017-07-14: qty 30

## 2017-07-14 MED ORDER — CIPROFLOXACIN HCL 500 MG PO TABS
500.0000 mg | ORAL_TABLET | Freq: Once | ORAL | Status: AC
  Administered 2017-07-14: 500 mg via ORAL
  Filled 2017-07-14: qty 1

## 2017-07-14 MED ORDER — SUCRALFATE 1 GM/10ML PO SUSP: 1.0000 g | Freq: Three times a day (TID) | ORAL | 0 refills | Status: AC

## 2017-07-14 NOTE — ED Notes (Signed)
Patient transported to CT 

## 2017-07-14 NOTE — Discharge Instructions (Addendum)
As discussed, today's evaluation has demonstrated the presence of enteritis, or inflammation/infection of the small intestine. Please take all medication as directed, monitor your condition carefully, and do not hesitate to return for concerning changes.  Your medications may change your INR/Coumadin value, and is important to continue to have your INR value checked regularly.

## 2017-07-14 NOTE — Telephone Encounter (Signed)
Pharmacy called related to Rx: cipro interaction with warfarin .Marland KitchenMarland KitchenEDCM clarified with EDP Vanita Panda) to change Rx to: dispense as written. Advised pt to check INR regularly.  Coretta Leisey J. Clydene Laming, [place], [place], [place]

## 2017-07-14 NOTE — ED Triage Notes (Signed)
Pt and family report she has been having vomiting since Sunday. Pt reports abdominal pain and some chest pain. Pt appears pale and weak in triage. HR fluctuating in triage as well.

## 2017-07-14 NOTE — ED Notes (Signed)
Pt drinking contrast for CT. 

## 2017-07-14 NOTE — ED Provider Notes (Signed)
[facility] EMERGENCY DEPARTMENT Provider Note   CSN: [id number] Arrival date & time: 07/14/17  3545     History   Chief Complaint Chief Complaint  Patient presents with  . Emesis    HPI Sylvia [name] is a 84 y.o. female.  HPI  Patient presents with concern of nausea, vomiting, abdominal pain and distention. She is here with 2 family members who assist with the HPI. She has multiple medical issues, including history of hysterectomy in the distant past. She also has atrial fibrillation, notes that she is chronically in this rhythm, and is rate controlled with medication. She has been taking her medication as directed, including Coumadin, verapamil. Illness began about 48 hours ago, after lunch, the patient had onset of nausea, abdominal pain. Since onset the pain is been persistently in the lower abdomen, left lower, midline. There is associated nausea, anorexia, and diminished oral intake since that time. She has had multiple episodes of vomiting, no diarrhea. Last bowel movement was possibly yesterday, though she is unsure. No chest pain, no dyspnea, no syncope, no near syncope.   Past Medical History:  Diagnosis Date  . Atrial fibrillation ([place])    ATRIAL FIBRILLATION S/P  PVI 6/11  . CAD (coronary artery disease)    a. 03/2002: cath showing 20% OM1 stenosis, EF at 65%, no WMA. No significant CAD.   Marland Kitchen GERD (gastroesophageal reflux disease)   . Hyperlipidemia   . Hypertension   . Macular degeneration   . SVT (supraventricular tachycardia) [facility])     Patient Active Problem List   Diagnosis Date Noted  . Unilateral primary osteoarthritis, right knee 03/02/2017  . Unilateral primary osteoarthritis, right knee 03/02/2017  . Encounter for therapeutic drug monitoring 05/24/2013  . Long term (current) use of anticoagulants 07/26/2010  . FATIGUE 05/23/2010  . HYPERCHOLESTEROLEMIA, PURE 02/13/2008  . HYPERTENSION, BENIGN 02/13/2008  . SVT/ PSVT/ PAT  02/13/2008  . ATRIAL FIBRILLATION 02/13/2008  . GERD 02/13/2008  . PALPITATIONS 02/13/2008    Past Surgical History:  Procedure Laterality Date  . ABLATION OF DYSRHYTHMIC FOCUS  09/2009   s/p PVI by JA  . TOTAL ABDOMINAL HYSTERECTOMY       OB History   None      Home Medications    Prior to Admission medications   Medication Sig Start Date End Date Taking? Authorizing Provider  acetaminophen (TYLENOL) 325 MG tablet Take by mouth every 6 (six) hours as needed for mild pain.   Yes [provider]  ALPRAZolam Duanne Moron) 0.5 MG tablet Take 0.5 mg by mouth at bedtime as needed for sleep.  01/08/17  Yes [provider]  calcium citrate-vitamin D (CITRACAL+D) 315-200 MG-UNIT per tablet Take 1 tablet by mouth daily.     Yes [provider]  fish oil-omega-3 fatty acids 1000 MG capsule Take 1 g by mouth 2 (two) times daily.    Yes [provider]  fluocinonide (LIDEX) 0.05 % external solution 2 (two) times daily. 1-2 GTTS IN EARS TWICE WEEKLY AS NEEDED    Yes [provider]  hydroxypropyl methylcellulose / hypromellose (ISOPTO TEARS / GONIOVISC) 2.5 % ophthalmic solution Place 1 drop into both eyes as needed for dry eyes.   Yes [provider]  levothyroxine (SYNTHROID, LEVOTHROID) 75 MCG tablet Take 75 mcg by mouth daily.     Yes [provider]  losartan (COZAAR) 100 MG tablet Take 100 mg by mouth daily.     Yes [provider]  Multiple Vitamins-Minerals (OCUVITE PRESERVISION PO) Take 1 tablet by mouth 2 (two) times daily.   Yes [provider]  pantoprazole (PROTONIX) 40 MG tablet Take 40 mg by mouth 2 (two) times daily.   Yes [provider]  polyethylene glycol powder (GLYCOLAX/MIRALAX) powder Take 17 g by mouth daily. PRN     Yes [provider]  promethazine (PHENERGAN) 25 MG tablet Take 25 mg by mouth 3 (three) times daily as needed. 07/13/17  Yes [provider]  raloxifene  (EVISTA) 60 MG tablet Take 60 mg by mouth daily.     Yes [provider]  verapamil (CALAN-SR) 180 MG CR tablet TAKE 1 TABLET BY MOUTH TWICE DAILY 03/09/17  Yes Lelon Perla, MD  warfarin (COUMADIN) 5 MG tablet Take 1/2 to 1 tablet daily as directed by coumadin clinic Patient taking differently: Take 1/2 to 1 tablet daily as directed by coumadin clinic. Patient takes 1/2 tablet 2.5mg  on Wednesday 06/26/17  Yes Crenshaw, Denice Bors, MD  traMADol (ULTRAM) 50 MG tablet Take 1 tablet (50 mg total) by mouth every 8 (eight) hours as needed. Patient not taking: Reported on 07/14/2017 01/27/17 01/27/18  Cherylann Ratel, PA-C    Family History Family History  Problem Relation Age of Onset  . Stroke Other   . Coronary artery disease Other     Social History Social History   Tobacco Use  . Smoking status: Never Smoker  . Smokeless tobacco: Never Used  Substance Use Topics  . Alcohol use: No  . Drug use: No     Allergies   Metoclopramide hcl; Penicillins; Pravastatin; and Sulfonamide derivatives   Review of Systems Review of Systems  Constitutional:       Per HPI, otherwise negative  HENT:       Per HPI, otherwise negative  Respiratory:       Per HPI, otherwise negative  Cardiovascular:       Per HPI, otherwise negative  Gastrointestinal: Positive for abdominal pain, nausea and vomiting.  Endocrine:       Negative aside from HPI  Genitourinary:       Neg aside from HPI   Musculoskeletal:       Per HPI, otherwise negative  Skin: Negative.   Neurological: Positive for weakness. Negative for syncope.     Physical Exam Updated Vital Signs BP 127/66   Pulse 79   Temp 97.8 F (36.6 C) (Oral)   Resp (!) 21   SpO2 94%   Physical Exam  Constitutional: She is oriented to person, place, and time. She has a sickly appearance. No distress.  HENT:  Head: Normocephalic and atraumatic.  Eyes: Conjunctivae and EOM are normal.  Cardiovascular: An irregularly irregular  rhythm present. Tachycardia present.  Pulmonary/Chest: Effort normal and breath sounds normal. No stridor. No respiratory distress.  Abdominal: She exhibits no distension.    Musculoskeletal: She exhibits no edema.  Neurological: She is alert and oriented to person, place, and time. No cranial nerve deficit.  Skin: Skin is warm and dry.  Psychiatric: She has a normal mood and affect.  Nursing note and vitals reviewed.    ED Treatments / Results  Labs (all labs ordered are listed, but only abnormal results are displayed) Labs Reviewed  COMPREHENSIVE METABOLIC PANEL - Abnormal; Notable for the following components:      Result Value   Potassium 3.1 (*)    Glucose, Bld 161 (*)    BUN 23 (*)    GFR calc  non Af Amer 53 (*)    All other components within normal limits  URINALYSIS, ROUTINE W REFLEX MICROSCOPIC - Abnormal; Notable for the following components:   Color, Urine AMBER (*)    APPearance HAZY (*)    Ketones, ur 5 (*)    Protein, ur 100 (*)    Leukocytes, UA SMALL (*)    Bacteria, UA FEW (*)    Squamous Epithelial / LPF 0-5 (*)    All other components within normal limits  PROTIME-INR - Abnormal; Notable for the following components:   Prothrombin Time 27.5 (*)    All other components within normal limits  LIPASE, BLOOD  CBC    EKG EKG Interpretation  Date/Time:  Tuesday July 14 2017 09:48:59 EDT Ventricular Rate:  146 PR Interval:    QRS Duration: 80 QT Interval:  280 QTC Calculation: 436 R Axis:   38 Text Interpretation:  Atrial fibrillation with rapid ventricular response Marked ST abnormality, possible inferior subendocardial injury Abnormal ekg Confirmed by Carmin Muskrat [phone]) on 07/14/2017 10:01:13 AM   Radiology Ct Abdomen Pelvis Wo Contrast  Result Date: 07/14/2017 CLINICAL DATA:  Nausea and vomiting and upper abdominal pain EXAM: CT ABDOMEN AND PELVIS WITHOUT CONTRAST TECHNIQUE: Multidetector CT imaging of the abdomen and pelvis was performed  following the standard protocol without IV contrast. COMPARISON:  None. FINDINGS: Lower chest: Minimal scarring is noted in the lower lobes bilaterally. No focal infiltrate or effusion is seen. Hepatobiliary: Liver is unremarkable. The gallbladder is well distended with tiny dependent gallstones. Pancreas: Unremarkable. No pancreatic ductal dilatation or surrounding inflammatory changes. Spleen: Normal in size without focal abnormality. Adrenals/Urinary Tract: The adrenal glands are unremarkable. The kidneys are well visualized bilaterally. No renal calculi or obstructive changes are noted. The bladder is partially distended. Stomach/Bowel: No findings to suggest diverticulitis are identified. The appendix is not well visualized. No inflammatory changes to suggest appendicitis are noted. A few mildly prominent loops of small bowel are noted in the left mid abdomen without definitive obstructive change as contrast material passes through to the colon. Some mild wall thickening is noted in these changes may represent some mild enteritis. Vascular/Lymphatic: Aortic atherosclerosis. No enlarged abdominal or pelvic lymph nodes. Reproductive: Status post hysterectomy. No adnexal masses. Other: No abdominal wall hernia or abnormality. No abdominopelvic ascites. Musculoskeletal: Degenerative changes of the lumbar spine are noted. Mild degenerative anterolisthesis of L4 on L5 is noted. IMPRESSION: Mildly prominent loops of jejunum with small-bowel wall thickening and very minimal inflammatory changes consistent with enteritis. No obstructive changes are noted. No evidence of diverticulitis. Chronic changes as described above. Electronically Signed   By: Inez Catalina M.D.   On: 07/14/2017 13:42    Procedures Procedures (including critical care time)  Medications Ordered in ED Medications  ciprofloxacin (CIPRO) tablet 500 mg (has no administration in time range)  metroNIDAZOLE (FLAGYL) tablet 500 mg (has no  administration in time range)  sodium chloride 0.9 % bolus 1,000 mL (0 mLs Intravenous Stopped 07/14/17 1301)  iopamidol (ISOVUE-300) 61 % injection (30 mLs Oral Contrast Given 07/14/17 1113)  ondansetron (ZOFRAN) injection 4 mg (4 mg Intravenous Given 07/14/17 1121)     Initial Impression / Assessment and Plan / ED Course  I have reviewed the triage vital signs and the nursing notes.  Pertinent labs & imaging results that were available during my care of the patient were reviewed by me and considered in my medical decision making (see chart for details).  Initial vitals notable for tachycardia,  rate low 100s, atrial fibrillation, abnormal  2:43 PM Patient awake, alert, states that she feels substantially better, family notes that she looks substantially better. I reviewed all CT, lab results with them, and reviewed the images. Patient's heart rate is now in the 70s, with no ongoing pain. We discussed patient's CT finding of enteritis in depth Patient will start antibiotics, follow-up with primary care. Absent other acute new findings, and with improved heart rate following fluids, and with resolution of her pain, nausea, vomiting, the patient is appropriate for discharge with close outpatient follow-up. Final Clinical Impressions(s) / ED Diagnoses  Enteritis   Carmin Muskrat, MD 07/14/17 1444

## 2017-07-21 ENCOUNTER — Ambulatory Visit (INDEPENDENT_AMBULATORY_CARE_PROVIDER_SITE_OTHER): Payer: Medicare Other | Admitting: *Deleted

## 2017-07-21 DIAGNOSIS — I4891 Unspecified atrial fibrillation: Secondary | ICD-10-CM | POA: Diagnosis not present

## 2017-07-21 DIAGNOSIS — Z5181 Encounter for therapeutic drug level monitoring: Secondary | ICD-10-CM

## 2017-07-21 LAB — POCT INR: INR: 3.9

## 2017-07-21 NOTE — Patient Instructions (Signed)
Description   Do not take coumadin today April 9th then continue taking 1 tablet daily except 1/2 tablet only on Wednesdays. Recheck in 2 weeks. Call us with any medication changes # [phone] [facility].

## 2017-07-24 ENCOUNTER — Telehealth: Payer: Self-pay | Admitting: Cardiology

## 2017-07-24 DIAGNOSIS — I129 Hypertensive chronic kidney disease with stage 1 through stage 4 chronic kidney disease, or unspecified chronic kidney disease: Secondary | ICD-10-CM | POA: Diagnosis not present

## 2017-07-24 DIAGNOSIS — K5909 Other constipation: Secondary | ICD-10-CM | POA: Diagnosis not present

## 2017-07-24 DIAGNOSIS — Z6828 Body mass index (BMI) 28.0-28.9, adult: Secondary | ICD-10-CM | POA: Diagnosis not present

## 2017-07-24 DIAGNOSIS — E038 Other specified hypothyroidism: Secondary | ICD-10-CM | POA: Diagnosis not present

## 2017-07-24 DIAGNOSIS — I482 Chronic atrial fibrillation: Secondary | ICD-10-CM | POA: Diagnosis not present

## 2017-07-24 NOTE — Telephone Encounter (Signed)
Patient's husband has been made aware of Dr. Jacalyn Lefevre recommendation. He will keep a log of the blood pressures and heart rate and call back if needed. The medication has been updated in the chart.

## 2017-07-24 NOTE — Telephone Encounter (Signed)
Ok to switch; follow HR and BP Kirk Ruths

## 2017-07-24 NOTE — Telephone Encounter (Signed)
Returned the call to the patient's husband, per the dpr. He stated that the patient saw her PCP today, Dr. Bevelyn Buckles, for chronic constipation. He suggested that the patient switch from Verapamil to Metoprolol tartate 50 mg bid. He stated that the PCP was concerned that the Verapamil was causing the constipation. The PCP has written a prescription for Metoprolol Tartrate 50 mg bid but the husband would like Dr. Jacalyn Lefevre approval before switching.

## 2017-07-24 NOTE — Telephone Encounter (Signed)
New Message:    Pt c/o medication issue:  1. Name of Medication: verapamil (CALAN-SR) 180 MG CR tablet  2. How are you currently taking this medication (dosage and times per day)?  TAKE 1 TABLET BY MOUTH TWICE DAILY 3. Are you having a reaction (difficulty breathing--STAT)?   4. What is your medication issue? Pt is having trouble with constipation and pt went to see PCP Dr. Sharlett Iles on yesterday. Dr. Sharlett Iles ask if we can take the pt off of Verapamil and put her on Metoprolol 50 mg 2x daily instead. Dr. Sharlett Iles states Verapamil is known for causing constipation.

## 2017-07-27 DIAGNOSIS — H353221 Exudative age-related macular degeneration, left eye, with active choroidal neovascularization: Secondary | ICD-10-CM | POA: Diagnosis not present

## 2017-07-27 DIAGNOSIS — H35351 Cystoid macular degeneration, right eye: Secondary | ICD-10-CM | POA: Diagnosis not present

## 2017-07-27 DIAGNOSIS — H353212 Exudative age-related macular degeneration, right eye, with inactive choroidal neovascularization: Secondary | ICD-10-CM | POA: Diagnosis not present

## 2017-07-27 DIAGNOSIS — H35363 Drusen (degenerative) of macula, bilateral: Secondary | ICD-10-CM | POA: Diagnosis not present

## 2017-07-27 DIAGNOSIS — H3570 Unspecified separation of retinal layers: Secondary | ICD-10-CM | POA: Diagnosis not present

## 2017-07-28 ENCOUNTER — Telehealth: Payer: Self-pay | Admitting: *Deleted

## 2017-07-28 NOTE — Telephone Encounter (Signed)
Pt telephoned and informed us that her eye doctor suggested she  take a [place] live culture probiotic since she has had enteritis and was seen in ER 07/14/17 and still not feeling well. I instructed her to call Dr Sharlett Iles her PCP who has done her GI follow up and see if he wants her to take the probiotic and let us know on Monday when she comes in if she took it for the 5 days as it could per Erasmo Downer D change the INR.

## 2017-08-03 ENCOUNTER — Ambulatory Visit (INDEPENDENT_AMBULATORY_CARE_PROVIDER_SITE_OTHER): Payer: Medicare Other | Admitting: *Deleted

## 2017-08-03 DIAGNOSIS — Z5181 Encounter for therapeutic drug level monitoring: Secondary | ICD-10-CM

## 2017-08-03 DIAGNOSIS — I4891 Unspecified atrial fibrillation: Secondary | ICD-10-CM | POA: Diagnosis not present

## 2017-08-03 LAB — POCT INR: INR: 2

## 2017-08-03 NOTE — Patient Instructions (Signed)
Description   Continue taking 1 tablet daily except 1/2 tablet only on Wednesdays. Recheck in 3 weeks. Call us with any medication changes # [phone] [facility].

## 2017-08-25 ENCOUNTER — Ambulatory Visit (INDEPENDENT_AMBULATORY_CARE_PROVIDER_SITE_OTHER): Payer: Medicare Other | Admitting: *Deleted

## 2017-08-25 DIAGNOSIS — I4891 Unspecified atrial fibrillation: Secondary | ICD-10-CM

## 2017-08-25 DIAGNOSIS — Z5181 Encounter for therapeutic drug level monitoring: Secondary | ICD-10-CM | POA: Diagnosis not present

## 2017-08-25 LAB — POCT INR: INR: 2.9

## 2017-08-25 NOTE — Patient Instructions (Signed)
Description   Continue taking 1 tablet daily except 1/2 tablet only on Wednesdays. Recheck in 4 weeks. Call us with any medication changes # [phone] [facility].

## 2017-08-31 DIAGNOSIS — H353221 Exudative age-related macular degeneration, left eye, with active choroidal neovascularization: Secondary | ICD-10-CM | POA: Diagnosis not present

## 2017-08-31 DIAGNOSIS — H353212 Exudative age-related macular degeneration, right eye, with inactive choroidal neovascularization: Secondary | ICD-10-CM | POA: Diagnosis not present

## 2017-08-31 DIAGNOSIS — H3570 Unspecified separation of retinal layers: Secondary | ICD-10-CM | POA: Diagnosis not present

## 2017-08-31 DIAGNOSIS — H35363 Drusen (degenerative) of macula, bilateral: Secondary | ICD-10-CM | POA: Diagnosis not present

## 2017-08-31 DIAGNOSIS — H35351 Cystoid macular degeneration, right eye: Secondary | ICD-10-CM | POA: Diagnosis not present

## 2017-09-15 DIAGNOSIS — Z1231 Encounter for screening mammogram for malignant neoplasm of breast: Secondary | ICD-10-CM | POA: Diagnosis not present

## 2017-09-22 ENCOUNTER — Ambulatory Visit (INDEPENDENT_AMBULATORY_CARE_PROVIDER_SITE_OTHER): Payer: Medicare Other | Admitting: *Deleted

## 2017-09-22 DIAGNOSIS — I4891 Unspecified atrial fibrillation: Secondary | ICD-10-CM

## 2017-09-22 DIAGNOSIS — Z5181 Encounter for therapeutic drug level monitoring: Secondary | ICD-10-CM

## 2017-09-22 LAB — POCT INR: INR: 2.9 (ref 2.0–3.0)

## 2017-09-22 NOTE — Patient Instructions (Signed)
Description   Continue taking 1 tablet daily except 1/2 tablet only on Wednesdays. Recheck in 4 weeks. Call us with any medication changes # [phone] [facility].

## 2017-09-24 ENCOUNTER — Other Ambulatory Visit: Payer: Self-pay | Admitting: Cardiology

## 2017-10-06 DIAGNOSIS — H43812 Vitreous degeneration, left eye: Secondary | ICD-10-CM | POA: Diagnosis not present

## 2017-10-06 DIAGNOSIS — H353113 Nonexudative age-related macular degeneration, right eye, advanced atrophic without subfoveal involvement: Secondary | ICD-10-CM | POA: Diagnosis not present

## 2017-10-06 DIAGNOSIS — H353221 Exudative age-related macular degeneration, left eye, with active choroidal neovascularization: Secondary | ICD-10-CM | POA: Diagnosis not present

## 2017-10-06 DIAGNOSIS — H35351 Cystoid macular degeneration, right eye: Secondary | ICD-10-CM | POA: Diagnosis not present

## 2017-10-20 ENCOUNTER — Ambulatory Visit (INDEPENDENT_AMBULATORY_CARE_PROVIDER_SITE_OTHER): Payer: Medicare Other

## 2017-10-20 DIAGNOSIS — Z5181 Encounter for therapeutic drug level monitoring: Secondary | ICD-10-CM | POA: Diagnosis not present

## 2017-10-20 DIAGNOSIS — I4891 Unspecified atrial fibrillation: Secondary | ICD-10-CM | POA: Diagnosis not present

## 2017-10-20 LAB — POCT INR: INR: 2.2 (ref 2.0–3.0)

## 2017-10-20 NOTE — Patient Instructions (Signed)
Description   Continue on same dosage 1 tablet daily except 1/2 tablet on Wednesdays. Recheck in 6 weeks. Call us with any medication changes # [phone] [facility].

## 2017-10-30 DIAGNOSIS — M859 Disorder of bone density and structure, unspecified: Secondary | ICD-10-CM | POA: Diagnosis not present

## 2017-10-30 DIAGNOSIS — E7849 Other hyperlipidemia: Secondary | ICD-10-CM | POA: Diagnosis not present

## 2017-10-30 DIAGNOSIS — R82998 Other abnormal findings in urine: Secondary | ICD-10-CM | POA: Diagnosis not present

## 2017-10-30 DIAGNOSIS — I10 Essential (primary) hypertension: Secondary | ICD-10-CM | POA: Diagnosis not present

## 2017-10-30 DIAGNOSIS — E038 Other specified hypothyroidism: Secondary | ICD-10-CM | POA: Diagnosis not present

## 2017-11-10 DIAGNOSIS — Z0000 Encounter for general adult medical examination without abnormal findings: Secondary | ICD-10-CM | POA: Diagnosis not present

## 2017-11-10 DIAGNOSIS — M545 Low back pain: Secondary | ICD-10-CM | POA: Diagnosis not present

## 2017-11-10 DIAGNOSIS — M1711 Unilateral primary osteoarthritis, right knee: Secondary | ICD-10-CM | POA: Diagnosis not present

## 2017-11-10 DIAGNOSIS — I10 Essential (primary) hypertension: Secondary | ICD-10-CM | POA: Diagnosis not present

## 2017-11-10 DIAGNOSIS — E038 Other specified hypothyroidism: Secondary | ICD-10-CM | POA: Diagnosis not present

## 2017-11-10 DIAGNOSIS — I482 Chronic atrial fibrillation: Secondary | ICD-10-CM | POA: Diagnosis not present

## 2017-11-10 DIAGNOSIS — M859 Disorder of bone density and structure, unspecified: Secondary | ICD-10-CM | POA: Diagnosis not present

## 2017-11-10 DIAGNOSIS — D649 Anemia, unspecified: Secondary | ICD-10-CM | POA: Diagnosis not present

## 2017-11-10 DIAGNOSIS — I129 Hypertensive chronic kidney disease with stage 1 through stage 4 chronic kidney disease, or unspecified chronic kidney disease: Secondary | ICD-10-CM | POA: Diagnosis not present

## 2017-11-10 DIAGNOSIS — E7849 Other hyperlipidemia: Secondary | ICD-10-CM | POA: Diagnosis not present

## 2017-11-10 DIAGNOSIS — Z6827 Body mass index (BMI) 27.0-27.9, adult: Secondary | ICD-10-CM | POA: Diagnosis not present

## 2017-11-10 DIAGNOSIS — Z1389 Encounter for screening for other disorder: Secondary | ICD-10-CM | POA: Diagnosis not present

## 2017-11-12 DIAGNOSIS — Z7901 Long term (current) use of anticoagulants: Secondary | ICD-10-CM | POA: Diagnosis not present

## 2017-11-12 DIAGNOSIS — D649 Anemia, unspecified: Secondary | ICD-10-CM | POA: Diagnosis not present

## 2017-11-17 DIAGNOSIS — H353212 Exudative age-related macular degeneration, right eye, with inactive choroidal neovascularization: Secondary | ICD-10-CM | POA: Diagnosis not present

## 2017-11-17 DIAGNOSIS — H3570 Unspecified separation of retinal layers: Secondary | ICD-10-CM | POA: Diagnosis not present

## 2017-11-17 DIAGNOSIS — H35351 Cystoid macular degeneration, right eye: Secondary | ICD-10-CM | POA: Diagnosis not present

## 2017-11-17 DIAGNOSIS — H353221 Exudative age-related macular degeneration, left eye, with active choroidal neovascularization: Secondary | ICD-10-CM | POA: Diagnosis not present

## 2017-12-01 ENCOUNTER — Ambulatory Visit (INDEPENDENT_AMBULATORY_CARE_PROVIDER_SITE_OTHER): Payer: Medicare Other | Admitting: *Deleted

## 2017-12-01 DIAGNOSIS — Z5181 Encounter for therapeutic drug level monitoring: Secondary | ICD-10-CM

## 2017-12-01 DIAGNOSIS — I4891 Unspecified atrial fibrillation: Secondary | ICD-10-CM

## 2017-12-01 LAB — POCT INR: INR: 2.5 (ref 2.0–3.0)

## 2017-12-01 NOTE — Patient Instructions (Signed)
Description   Continue on same dosage 1 tablet daily except 1/2 tablet on Wednesdays. Recheck in 6 weeks. Call us with any medication changes # [phone] [facility].

## 2017-12-07 ENCOUNTER — Ambulatory Visit (INDEPENDENT_AMBULATORY_CARE_PROVIDER_SITE_OTHER): Payer: Medicare Other | Admitting: Neurology

## 2017-12-07 ENCOUNTER — Encounter: Payer: Self-pay | Admitting: Neurology

## 2017-12-07 VITALS — BP 130/68 | HR 80 | Ht 61.5 in | Wt 150.0 lb

## 2017-12-07 DIAGNOSIS — G479 Sleep disorder, unspecified: Secondary | ICD-10-CM | POA: Diagnosis not present

## 2017-12-07 DIAGNOSIS — R0683 Snoring: Secondary | ICD-10-CM | POA: Diagnosis not present

## 2017-12-07 DIAGNOSIS — G478 Other sleep disorders: Secondary | ICD-10-CM

## 2017-12-07 DIAGNOSIS — R6889 Other general symptoms and signs: Secondary | ICD-10-CM

## 2017-12-07 DIAGNOSIS — R351 Nocturia: Secondary | ICD-10-CM | POA: Diagnosis not present

## 2017-12-07 DIAGNOSIS — G4719 Other hypersomnia: Secondary | ICD-10-CM | POA: Diagnosis not present

## 2017-12-07 NOTE — Progress Notes (Signed)
Subjective:    Patient ID: Sylvia [name] is a 84 y.o. female.  HPI     Star Age, MD, PhD [facility] [address] [place], [place] [address]  Dear Dr. Philip Aspen,   I saw your patient, [name], upon your kind request in my neurologic clinic today for initial consultation of her sleep disorder, in particular, concern for underlying obstructive sleep apnea. The patient is accompanied by her husband today. As you know, Ms. [name] is an 84 year old right-handed woman with an underlying medical history of persistent A. fib, status post failed ablation and on anticoagulation, reflux disease, macular degeneration, hypertension, osteopenia, anxiety, hypothyroidism, eczema, and overweight state, who reports chronic difficulty with her sleep including difficulty initiating sleep and maintaining sleep. Over-the-counter medication she has tried in the past made her groggy. She has had vivid dreams and sometimes nightmares. She has a long-standing history of sleep talking and also sleepwalking as a child. Sometimes she has sat up in bed per husband. She has occasional sleep talking. First thing in the morning she feels reasonably well rested but is tired during the day. She tries not to nap during the day. She has nocturia about twice per average night. I reviewed your office note from 11/10/2017, which you kindly included. She complains of nonrestorative sleep and difficulty maintaining sleep as well as initiating sleep. Her Epworth sleepiness score is 6 out of 24, fatigue score is 43 out of 63. Per husband, she snores mildly and only occasionally. Her bedtime is generally around 10:15 PM. She does not watch TV while in bed. Rise time is typically between 5 and 6 AM.  Her Past Medical History Is Significant For: Past Medical History:  Diagnosis Date  . Atrial fibrillation ([place])    ATRIAL FIBRILLATION S/P  PVI 6/11  . CAD (coronary artery disease)    a. 03/2002: cath showing 20% OM1 stenosis, EF at 65%, no WMA. No significant CAD.   Marland Kitchen GERD (gastroesophageal reflux disease)   . Hyperlipidemia   . Hypertension   . Macular degeneration   . SVT (supraventricular tachycardia) ([place])     Her Past Surgical History Is Significant For: Past Surgical History:  Procedure Laterality Date  . ABLATION OF DYSRHYTHMIC FOCUS  09/2009   s/p PVI by JA  . TOTAL ABDOMINAL HYSTERECTOMY      Her Family History Is Significant For: Family History  Problem Relation Age of Onset  . Stroke Other   . Coronary artery disease Other     Her Social History Is Significant For: Social History   Socioeconomic History  . Marital status: Married    Spouse name: Not on file  . Number of children: Not on file  . Years of education: Not on file  . Highest education level: Not on file  Occupational History  . Not on file  Social Needs  . Financial resource strain: Not on file  . Food insecurity:    Worry: Not on file    Inability: Not on file  . Transportation needs:    Medical: Not on file    Non-medical: Not on file  Tobacco Use  . Smoking status: Never Smoker  . Smokeless tobacco: Never Used  Substance and Sexual Activity  . Alcohol use: No  . Drug use: No  . Sexual activity: Not on file  Lifestyle  . Physical activity:    Days per week: Not on file    Minutes per session: Not on  file  . Stress: Not on file  Relationships  . Social connections:    Talks on phone: Not on file    Gets together: Not on file    Attends religious service: Not on file    Active member of club or organization: Not on file    Attends meetings of clubs or organizations: Not on file    Relationship status: Not on file  Other Topics Concern  . Not on file  Social History Narrative   Lives with spouse    Her Allergies Are:  Allergies  Allergen Reactions  . Metoclopramide Hcl     REACTION: nervous, climbs the wall  . Penicillins     REACTION: hives  .  Pravastatin   . Sulfonamide Derivatives     REACTION: rash  :   Her Current Medications Are:  Outpatient Encounter Medications as of 12/07/2017  Medication Sig  . acetaminophen (TYLENOL) 325 MG tablet Take by mouth every 6 (six) hours as needed for mild pain.  . calcium citrate-vitamin D (CITRACAL+D) 315-200 MG-UNIT per tablet Take 1 tablet by mouth daily.    . fish oil-omega-3 fatty acids 1000 MG capsule Take 1 g by mouth 2 (two) times daily.   . fluocinonide (LIDEX) 0.05 % external solution 2 (two) times daily. 1-2 GTTS IN EARS TWICE WEEKLY AS NEEDED   . hydroxypropyl methylcellulose / hypromellose (ISOPTO TEARS / GONIOVISC) 2.5 % ophthalmic solution Place 1 drop into both eyes as needed for dry eyes.  Marland Kitchen levothyroxine (SYNTHROID, LEVOTHROID) 75 MCG tablet Take 75 mcg by mouth daily.    Marland Kitchen losartan (COZAAR) 100 MG tablet Take 100 mg by mouth daily.    . meloxicam (MOBIC) 15 MG tablet Take 15 mg by mouth daily as needed for pain.  . metoprolol tartrate (LOPRESSOR) 50 MG tablet Take 50 mg by mouth 2 (two) times daily.  . Multiple Vitamins-Minerals (OCUVITE PRESERVISION PO) Take 1 tablet by mouth 2 (two) times daily.  . pantoprazole (PROTONIX) 40 MG tablet Take 40 mg by mouth 2 (two) times daily.  . polyethylene glycol powder (GLYCOLAX/MIRALAX) powder Take 17 g by mouth daily. PRN    . promethazine (PHENERGAN) 25 MG tablet Take 25 mg by mouth 3 (three) times daily as needed.  . raloxifene (EVISTA) 60 MG tablet Take 60 mg by mouth daily.    . sucralfate (CARAFATE) 1 GM/10ML suspension Take 10 mLs (1 g total) by mouth 4 (four) times daily -  with meals and at bedtime.  . traMADol (ULTRAM) 50 MG tablet Take 50 mg by mouth every 8 (eight) hours as needed.  . warfarin (COUMADIN) 5 MG tablet TAKE 1/2 TO 1 (ONE-HALF TO ONE) TABLET BY MOUTH ONCE DAILY AS DIRECTED BY COUMADIN CLINIC   No facility-administered encounter medications on file as of 12/07/2017.   :  Review of Systems:  Out of a complete  14 point review of systems, all are reviewed and negative with the exception of these symptoms as listed below: Review of Systems  Neurological:       Pt presents today to discuss her sleep. Pt has never had a sleep study. Pt reports that she does not snore.  Epworth Sleepiness Scale 0= would never doze 1= slight chance of dozing 2= moderate chance of dozing 3= high chance of dozing  Sitting and reading: 2 Watching TV: 1 Sitting inactive in a public place (ex. Theater or meeting): 0 As a passenger in a car for an hour without a break:  0 Lying down to rest in the afternoon: 2 Sitting and talking to someone: 0 Sitting quietly after lunch (no alcohol): 1 In a car, while stopped in traffic: 0 Total: 6     Objective:  Neurological Exam  Physical Exam Physical Examination:   Vitals:   12/07/17 1607  BP: 130/68  Pulse: 80   General Examination: The patient is a very pleasant 84 y.o. female in no acute distress. She appears well-developed and well-nourished and well groomed.   HEENT: Normocephalic, atraumatic, pupils are equal, round and reactive to light and accommodation. Extraocular tracking is good without limitation to gaze excursion or nystagmus noted. Normal smooth pursuit is noted. Hearing is grossly intact. Face is symmetric with normal facial animation and normal facial sensation. Speech is clear with no dysarthria noted. There is no hypophonia. There is no lip, neck/head, jaw or voice tremor. Neck is supple with full range of passive and active motion. There are no carotid bruits on auscultation. Oropharynx exam reveals: moderate mouth dryness, adequate dental hygiene and mild airway crowding, due to smaller airway entry. Mallampati is class I. Tongue protrudes centrally and palate elevates symmetrically. Tonsils are small. Neck size is 13.5 inches.  Chest: Clear to auscultation without wheezing, rhonchi or crackles noted.  Heart: S1+S2+0, slightly irregular, without  murmurs, rubs or gallops noted.   Abdomen: Soft, non-tender and non-distended with normal bowel sounds appreciated on auscultation.  Extremities: There is trace pitting edema in the distal lower extremities bilaterally.  Skin: Warm and dry without trophic changes noted.  Musculoskeletal: exam reveals no obvious joint deformities, tenderness or joint swelling or erythema.   Neurologically:  Mental status: The patient is awake, alert and oriented in all 4 spheres. Her immediate and remote memory, attention, language skills and fund of knowledge are appropriate. There is no evidence of aphasia, agnosia, apraxia or anomia. Speech is clear with normal prosody and enunciation. Thought process is linear. Mood is normal and affect is normal.  Cranial nerves II - XII are as described above under HEENT exam. In addition: shoulder shrug is normal with equal shoulder height noted. Motor exam: Normal bulk, strength and tone is noted. There is no tremor. Fine motor skills and coordination: grossly intact.  Cerebellar testing: No dysmetria or intention tremor. There is no truncal or gait ataxia.  Sensory exam: intact to light touch.  Gait, station and balance: She stands easily. No veering to one side is noted. No leaning to one side is noted. Posture is age-appropriate and stance is narrow based. Gait shows normal stride length and normal pace. No problems turning are noted.         Assessment and plan:   In summary, LYNANN DEMETRIUS is a very pleasant 84 y.o.-year old female with an underlying medical history of persistent A. fib, status post failed ablation and on anticoagulation, reflux disease, macular degeneration, hypertension, osteopenia, anxiety, hypothyroidism, eczema, and overweight state, who presents for evaluation of her sleep disturbance. She reports chronic difficulty with sleep initiation and sleep maintenance and also a history of parasomnias since childhood. She is advised that unfortunately  for vivid dreams or sleep walking and sleep talking there is no good treatment, sometimes the vivid dreams can be triggered by medications. She does not endorse telltale symptoms of obstructive sleep apnea but I would be happy to check with a sleep study. She would not favor an attendant sleep study and would like to proceed with a home sleep test. She is advised that a  home sleep test is not as accurate in terms of monitoring different stages of sleep and also does not allow to monitor for parasomnias such as abnormal dreams or dream enactment behaviors, or sleep walking or sleep talking episodes. Nevertheless, we will proceed with a home sleep test. I talked to the patient and her husband about sleep apnea and management of sleep apnea potentially with a CPAP her AutoPap machine. We will proceed with home sleep testing and take it from there. I answered all their questions today and the patient and her husband were in agreement. Thank you very much for allowing me to participate in the care of this nice patient. If I can be of any further assistance to you please do not hesitate to call me at [phone].  Sincerely,   Star Age, MD, PhD

## 2017-12-07 NOTE — Patient Instructions (Addendum)
We will order a home sleep test to investigate your sleep problem. However, as discussed, a home sleep test does not allow me to monitor your sleep accurately, and does not monitor for nightmares or sleep behaviors such as sleep walking or sleep talking.

## 2017-12-23 ENCOUNTER — Ambulatory Visit (INDEPENDENT_AMBULATORY_CARE_PROVIDER_SITE_OTHER): Payer: Medicare Other | Admitting: Neurology

## 2017-12-23 DIAGNOSIS — G4719 Other hypersomnia: Secondary | ICD-10-CM

## 2017-12-23 DIAGNOSIS — G478 Other sleep disorders: Secondary | ICD-10-CM

## 2017-12-23 DIAGNOSIS — R0683 Snoring: Secondary | ICD-10-CM

## 2017-12-23 DIAGNOSIS — R6889 Other general symptoms and signs: Secondary | ICD-10-CM

## 2017-12-23 DIAGNOSIS — G479 Sleep disorder, unspecified: Secondary | ICD-10-CM

## 2017-12-23 DIAGNOSIS — G4733 Obstructive sleep apnea (adult) (pediatric): Secondary | ICD-10-CM

## 2017-12-23 DIAGNOSIS — R351 Nocturia: Secondary | ICD-10-CM

## 2017-12-26 ENCOUNTER — Other Ambulatory Visit: Payer: Self-pay | Admitting: Cardiology

## 2017-12-28 ENCOUNTER — Other Ambulatory Visit: Payer: Self-pay | Admitting: Pharmacist

## 2017-12-28 MED ORDER — WARFARIN SODIUM 5 MG PO TABS: ORAL_TABLET | ORAL | 0 refills | Status: DC

## 2017-12-29 DIAGNOSIS — H353221 Exudative age-related macular degeneration, left eye, with active choroidal neovascularization: Secondary | ICD-10-CM | POA: Diagnosis not present

## 2017-12-29 DIAGNOSIS — H353212 Exudative age-related macular degeneration, right eye, with inactive choroidal neovascularization: Secondary | ICD-10-CM | POA: Diagnosis not present

## 2017-12-29 DIAGNOSIS — H353113 Nonexudative age-related macular degeneration, right eye, advanced atrophic without subfoveal involvement: Secondary | ICD-10-CM | POA: Diagnosis not present

## 2017-12-29 DIAGNOSIS — H35351 Cystoid macular degeneration, right eye: Secondary | ICD-10-CM | POA: Diagnosis not present

## 2017-12-31 ENCOUNTER — Telehealth: Payer: Self-pay | Admitting: *Deleted

## 2017-12-31 NOTE — Addendum Note (Signed)
Addended by: Star Age on: 12/31/2017 07:56 AM   Modules accepted: Orders

## 2017-12-31 NOTE — Procedures (Signed)
[facility] [place], [place] [address] NAME:  Sylvia [name]                                                                DOB: 1933-02-24 MEDICAL RECORD NUMBER  [id number]                                                  DOS:  12/23/2017 REFERRING PHYSICIAN: Leanna Battles, MD STUDY PERFORMED: Home Sleep Study HISTORY: 84 year old woman with a history of persistent A. fib, status post failed ablation and on anticoagulation, reflux disease, macular degeneration, hypertension, osteopenia, anxiety, hypothyroidism, eczema, and overweight state, who reports chronic difficulty with her sleep including difficulty initiating sleep and maintaining sleep. BMI: 28.3.   STUDY RESULTS:  Total Recording Time: 8 hours, 37 minutes (valid test time: 4 hours, 37 minutes) Total Apnea/Hypopnea Index (AHI): 16.9/h, RDI: 20.2/h Average Oxygen Saturation: 93%, Lowest Oxygen Desaturation: 86%  Total Time Oxygen Saturation Below or at 88%:  3.0 minutes  Average Heart Rate:   69 bpm (between 54 and 81 bpm) IMPRESSION: OSA RECOMMENDATION: This home sleep test demonstrates overall moderate obstructive sleep apnea with a total AHI of 16.9/hour and O2 nadir of 86%. Given the patient's medical history and sleep related complaints, treatment with positive airway pressure (in the form of CPAP) is recommended. This will require a full night CPAP titration study for proper treatment settings and mask fitting. Please note that untreated obstructive sleep apnea may carry additional perioperative morbidity. Patients with significant obstructive sleep apnea should receive perioperative PAP therapy and the surgeons and particularly the anesthesiologist should be informed of the diagnosis and the severity of the sleep disordered breathing. The patient should be cautioned not to drive, work at heights, or operate dangerous or heavy equipment when tired or sleepy. Review and reiteration of good  sleep hygiene measures should be pursued with any patient. Other causes of the patient's symptoms, including circadian rhythm disturbances, an underlying mood disorder, medication effect and/or an underlying medical problem cannot be ruled out based on this test. Clinical correlation is recommended. The patient will be seen in follow-up by Dr. Rexene Alberts at [facility] for discussion of the test results and further management strategies. The referring provider will be notified of the test results.  I certify that I have reviewed the raw data recording prior to the issuance of this report in accordance with the standards of the American Academy of Sleep Medicine (AASM).  Star Age, MD, PhD [facility]) Diplomat, ABPN (Neurology and Sleep)

## 2017-12-31 NOTE — Progress Notes (Signed)
Patient referred by Dr. Philip Aspen, seen by me on 12/07/17, HST on 12/23/17:  Please call and notify the patient that the recent home sleep test did suggest the diagnosis of moderate obstructive sleep apnea and that I recommend treatment for this in the form of CPAP. I will request an overnight sleep study for proper titration and mask fitting. Please explain to patient and arrange for a CPAP titration study. I have placed an order in the chart. Thanks.   Star Age, MD, PhD [facility])

## 2017-12-31 NOTE — Telephone Encounter (Signed)
Called and LVM for pt about sleep study results per Dr. Guadelupe Sabin note. Gave [facility] phone number to call if she has questions.  Advised she will be called to schedule overnight sleep study.

## 2017-12-31 NOTE — Telephone Encounter (Signed)
-----   Message from Star Age, MD sent at 12/31/2017  7:56 AM EDT ----- Patient referred by Dr. Philip Aspen, seen by me on 12/07/17, HST on 12/23/17:  Please call and notify the patient that the recent home sleep test did suggest the diagnosis of moderate obstructive sleep apnea and that I recommend treatment for this in the form of CPAP. I will request an overnight sleep study for proper titration and mask fitting. Please explain to patient and arrange for a CPAP titration study. I have placed an order in the chart. Thanks.   Star Age, MD, PhD [facility])

## 2018-01-04 ENCOUNTER — Telehealth: Payer: Self-pay

## 2018-01-04 NOTE — Telephone Encounter (Signed)
Pt is hesitant and not sure if she wants CPAP machine.  Pt will call sleep lab back when she decides.

## 2018-01-13 ENCOUNTER — Ambulatory Visit (INDEPENDENT_AMBULATORY_CARE_PROVIDER_SITE_OTHER): Payer: Medicare Other | Admitting: *Deleted

## 2018-01-13 DIAGNOSIS — I4891 Unspecified atrial fibrillation: Secondary | ICD-10-CM | POA: Diagnosis not present

## 2018-01-13 DIAGNOSIS — Z5181 Encounter for therapeutic drug level monitoring: Secondary | ICD-10-CM

## 2018-01-13 LAB — POCT INR: INR: 2.3 (ref 2.0–3.0)

## 2018-01-13 NOTE — Patient Instructions (Signed)
Description   Continue on same dosage 1 tablet daily except 1/2 tablet on Wednesdays. Recheck in 6 weeks. Call us with any medication changes # [phone] [facility].

## 2018-02-03 DIAGNOSIS — Z23 Encounter for immunization: Secondary | ICD-10-CM | POA: Diagnosis not present

## 2018-02-08 NOTE — Progress Notes (Signed)
HPI: FU permanent atrial fibrillation and SVT. Last echocardiogram in June of 2011 showed normal LV function. There was trivial aortic and mitral regurgitation. Also note she had a cardiac catheterization in December 2003 that showed a 20% first obtuse marginal but otherwise no obstructive disease. She was referred for atrial fibrillation ablation. She had this procedure in June of 2011. Following the procedure she did have recurrent atrial fibrillation. She was seen by Dr. Rayann Heman and consideration of repeat ablation was discussed. However the decision was made for rate control and anticoagulation. 24-hour Holter monitor in December 2015 showed rate was controlled. Nuclear study April 2018 showed ejection fraction 56% and no ischemia. Since I last saw her  she has mild dyspnea on exertion but no orthopnea, PND, palpitations, syncope or chest pain.  Current Outpatient Medications  Medication Sig Dispense Refill  . acetaminophen (TYLENOL) 325 MG tablet Take by mouth every 6 (six) hours as needed for mild pain.    . calcium citrate-vitamin D (CITRACAL+D) 315-200 MG-UNIT per tablet Take 1 tablet by mouth daily.      . fish oil-omega-3 fatty acids 1000 MG capsule Take 1 g by mouth 2 (two) times daily.     . fluocinonide (LIDEX) 0.05 % external solution 2 (two) times daily. 1-2 GTTS IN EARS TWICE WEEKLY AS NEEDED     . hydroxypropyl methylcellulose / hypromellose (ISOPTO TEARS / GONIOVISC) 2.5 % ophthalmic solution Place 1 drop into both eyes as needed for dry eyes.    Marland Kitchen levothyroxine (SYNTHROID, LEVOTHROID) 75 MCG tablet Take 75 mcg by mouth daily.      Marland Kitchen losartan (COZAAR) 100 MG tablet Take 100 mg by mouth daily.      . meloxicam (MOBIC) 15 MG tablet Take 15 mg by mouth daily as needed for pain.    . metoprolol tartrate (LOPRESSOR) 50 MG tablet Take 50 mg by mouth 2 (two) times daily.    . Multiple Vitamins-Minerals (OCUVITE PRESERVISION PO) Take 1 tablet by mouth 2 (two) times daily.    .  pantoprazole (PROTONIX) 40 MG tablet Take 40 mg by mouth 2 (two) times daily.    . polyethylene glycol powder (GLYCOLAX/MIRALAX) powder Take 17 g by mouth daily. PRN      . promethazine (PHENERGAN) 25 MG tablet Take 25 mg by mouth 3 (three) times daily as needed.  0  . raloxifene (EVISTA) 60 MG tablet Take 60 mg by mouth daily.      . sucralfate (CARAFATE) 1 GM/10ML suspension Take 10 mLs (1 g total) by mouth 4 (four) times daily -  with meals and at bedtime. 420 mL 0  . traMADol (ULTRAM) 50 MG tablet Take 50 mg by mouth every 8 (eight) hours as needed.    . warfarin (COUMADIN) 5 MG tablet TAKE 1/2 to 1 TABLET BY MOUTH ONCE DAILY AS  DIRECTED  BY  COUMADIN  CLINIC 90 tablet 0  . warfarin (COUMADIN) 5 MG tablet TAKE 1/2 TO 1 (ONE-HALF TO ONE) TABLET BY MOUTH ONCE DAILY AS DIRECTED BY COUMADIN CLINIC 90 tablet 0   No current facility-administered medications for this visit.      Past Medical History:  Diagnosis Date  . Atrial fibrillation ([place])    ATRIAL FIBRILLATION S/P  PVI 6/11  . CAD (coronary artery disease)    a. 03/2002: cath showing 20% OM1 stenosis, EF at 65%, no WMA. No significant CAD.   Marland Kitchen GERD (gastroesophageal reflux disease)   . Hyperlipidemia   .  Hypertension   . Macular degeneration   . SVT (supraventricular tachycardia) ([facility])     Past Surgical History:  Procedure Laterality Date  . ABLATION OF DYSRHYTHMIC FOCUS  09/2009   s/p PVI by JA  . TOTAL ABDOMINAL HYSTERECTOMY      Social History   Socioeconomic History  . Marital status: Married    Spouse name: Not on file  . Number of children: Not on file  . Years of education: Not on file  . Highest education level: Not on file  Occupational History  . Not on file  Social Needs  . Financial resource strain: Not on file  . Food insecurity:    Worry: Not on file    Inability: Not on file  . Transportation needs:    Medical: Not on file    Non-medical: Not on file  Tobacco Use  . Smoking status: Never Smoker    . Smokeless tobacco: Never Used  Substance and Sexual Activity  . Alcohol use: No  . Drug use: No  . Sexual activity: Not on file  Lifestyle  . Physical activity:    Days per week: Not on file    Minutes per session: Not on file  . Stress: Not on file  Relationships  . Social connections:    Talks on phone: Not on file    Gets together: Not on file    Attends religious service: Not on file    Active member of club or organization: Not on file    Attends meetings of clubs or organizations: Not on file    Relationship status: Not on file  . Intimate partner violence:    Fear of current or ex partner: Not on file    Emotionally abused: Not on file    Physically abused: Not on file    Forced sexual activity: Not on file  Other Topics Concern  . Not on file  Social History Narrative   Lives with spouse    Family History  Problem Relation Age of Onset  . Stroke Other   . Coronary artery disease Other     ROS: fatigue but no fevers or chills, productive cough, hemoptysis, dysphasia, odynophagia, melena, hematochezia, dysuria, hematuria, rash, seizure activity, orthopnea, PND, pedal edema, claudication. Remaining systems are negative.  Physical Exam: Well-developed well-nourished in no acute distress.  Skin is warm and dry.  HEENT is normal.  Neck is supple.  Chest is clear to auscultation with normal expansion.  Cardiovascular exam is irregular Abdominal exam nontender or distended. No masses palpated. Extremities show no edema; varicosities noted. neuro grossly intact  A/P  1 permanent atrial fibrillation-patient remains asymptomatic.  Our plan is rate control and anticoagulation long-term.  Plan to continue metoprolol.  Continue Coumadin.  I have provided the names of apixaban and Xarelto.  They will check with their insurance company and if affordable we will transition.  2 hypertension-patient's blood pressure is controlled.  Continue present medications and  follow.  3 history of supraventricular tachycardia-patient has not had any recent episodes.  Continue metoprolol.  Can consider referral for ablation if symptoms worsen in the future.  Kirk Ruths, MD

## 2018-02-11 ENCOUNTER — Ambulatory Visit (INDEPENDENT_AMBULATORY_CARE_PROVIDER_SITE_OTHER): Payer: Medicare Other | Admitting: Cardiology

## 2018-02-11 ENCOUNTER — Encounter: Payer: Self-pay | Admitting: Cardiology

## 2018-02-11 VITALS — BP 120/72 | HR 81 | Ht 61.5 in | Wt 150.0 lb

## 2018-02-11 DIAGNOSIS — I10 Essential (primary) hypertension: Secondary | ICD-10-CM

## 2018-02-11 DIAGNOSIS — I4821 Permanent atrial fibrillation: Secondary | ICD-10-CM

## 2018-02-11 DIAGNOSIS — I471 Supraventricular tachycardia: Secondary | ICD-10-CM | POA: Diagnosis not present

## 2018-02-11 NOTE — Patient Instructions (Signed)
Medication Instructions:  Continue same medications If you need a refill on your cardiac medications before your next appointment, please call your pharmacy.   Lab work: None ordered   Testing/Procedures: None ordered  Follow-Up: At [facility], you and your health needs are our priority.  As part of our continuing mission to provide you with exceptional heart care, we have created designated Provider Care Teams.  These Care Teams include your primary Cardiologist (physician) and Advanced Practice Providers (APPs -  Physician Assistants and Nurse Practitioners) who all work together to provide you with the care you need, when you need it. . Follow up with Dr.Crenshaw in 1 year

## 2018-02-15 DIAGNOSIS — H353221 Exudative age-related macular degeneration, left eye, with active choroidal neovascularization: Secondary | ICD-10-CM | POA: Diagnosis not present

## 2018-02-15 DIAGNOSIS — H353113 Nonexudative age-related macular degeneration, right eye, advanced atrophic without subfoveal involvement: Secondary | ICD-10-CM | POA: Diagnosis not present

## 2018-02-15 DIAGNOSIS — H43813 Vitreous degeneration, bilateral: Secondary | ICD-10-CM | POA: Diagnosis not present

## 2018-02-16 ENCOUNTER — Ambulatory Visit (INDEPENDENT_AMBULATORY_CARE_PROVIDER_SITE_OTHER): Payer: Medicare Other | Admitting: Neurology

## 2018-02-16 DIAGNOSIS — G4733 Obstructive sleep apnea (adult) (pediatric): Secondary | ICD-10-CM | POA: Diagnosis not present

## 2018-02-16 DIAGNOSIS — G4720 Circadian rhythm sleep disorder, unspecified type: Secondary | ICD-10-CM

## 2018-02-16 DIAGNOSIS — G478 Other sleep disorders: Secondary | ICD-10-CM

## 2018-02-16 DIAGNOSIS — R6889 Other general symptoms and signs: Secondary | ICD-10-CM

## 2018-02-16 DIAGNOSIS — R9431 Abnormal electrocardiogram [ECG] [EKG]: Secondary | ICD-10-CM

## 2018-02-16 DIAGNOSIS — G479 Sleep disorder, unspecified: Secondary | ICD-10-CM

## 2018-02-16 DIAGNOSIS — G4719 Other hypersomnia: Secondary | ICD-10-CM

## 2018-02-16 DIAGNOSIS — R351 Nocturia: Secondary | ICD-10-CM

## 2018-02-23 ENCOUNTER — Ambulatory Visit (INDEPENDENT_AMBULATORY_CARE_PROVIDER_SITE_OTHER): Payer: Medicare Other | Admitting: *Deleted

## 2018-02-23 ENCOUNTER — Telehealth: Payer: Self-pay

## 2018-02-23 DIAGNOSIS — I4891 Unspecified atrial fibrillation: Secondary | ICD-10-CM

## 2018-02-23 DIAGNOSIS — Z5181 Encounter for therapeutic drug level monitoring: Secondary | ICD-10-CM | POA: Diagnosis not present

## 2018-02-23 LAB — POCT INR: INR: 2.9 (ref 2.0–3.0)

## 2018-02-23 NOTE — Telephone Encounter (Signed)
-----   Message from Star Age, MD sent at 02/23/2018  8:51 AM EST ----- Patient referred by Dr. Philip Aspen, seen by me on 12/07/17, HST on 12/23/17, CPAP titration study on 02/16/18.  Please call and inform patient that I have entered an order for treatment with positive airway pressure (PAP) treatment for obstructive sleep apnea (OSA). She did fairly during the latest sleep study with CPAP, did not sleep well. Nevertheless, I would recommend treatment at home with CPAP. We will, therefore, arrange for a machine for home use through a DME (durable medical equipment) company of Her choice; and I will see the patient back in follow-up in about 10 weeks. Please also explain to the patient that I will be looking out for compliance data, which can be downloaded from the machine (stored on an SD card, that is inserted in the machine) or via remote access through a modem, that is built into the machine. At the time of the followup appointment we will discuss sleep study results and how it is going with PAP treatment at home. Please advise patient to bring Her machine at the time of the first FU visit, even though this is cumbersome. Bringing the machine for every visit after that will likely not be needed, but often helps for the first visit to troubleshoot if needed. Please re-enforce the importance of compliance with treatment and the need for [place] to monitor compliance data - often an insurance requirement and actually good feedback for the patient as far as how they are doing.  Also remind patient, that any interim PAP machine or mask issues should be first addressed with the DME company, as they can often help better with technical and mask fit issues. Please ask if patient has a preference regarding DME company.  Please also make sure, the patient has a follow-up appointment with me in about 10 weeks from the setup date, thanks. May see one of our nurse practitioners if needed for proper timing of the FU appointment.   Please fax or rout report to the referring provider. Thanks,   Star Age, MD, PhD [facility])

## 2018-02-23 NOTE — Telephone Encounter (Signed)
I called pt. I advised pt that Dr. Rexene Alberts reviewed their sleep study results and found that pt did fairly well with the cpap during her latest sleep study. Dr. Rexene Alberts recommends that pt start a cpap at home. I reviewed PAP compliance expectations with the pt. Pt is agreeable to starting a CPAP. I advised pt that an order will be sent to a DME, [facility], and [facility] will call the pt within about one week after they file with the pt's insurance. [facility] will show the pt how to use the machine, fit for masks, and troubleshoot the CPAP if needed. A follow up appt was made for insurance purposes with Hoyle Sauer, NP on 06/02/18 at 2:15pm. Pt verbalized understanding to arrive 15 minutes early and bring their CPAP. A letter with all of this information in it will be mailed to the pt as a reminder. I verified with the pt that the address we have on file is correct. Pt verbalized understanding of results. Pt had no questions at this time but was encouraged to call back if questions arise. I have sent the order to [facility] and have received confirmation that they have received the order.

## 2018-02-23 NOTE — Addendum Note (Signed)
Addended by: Star Age on: 02/23/2018 08:51 AM   Modules accepted: Orders

## 2018-02-23 NOTE — Procedures (Signed)
PATIENT'S NAME:  Sylvia [name], Sylvia [name] DOB:      1932/06/21      MR#:    [id number]     DATE OF RECORDING: 02/16/2018 REFERRING M.D.:  Leanna Battles, MD Study Performed:   CPAP  Titration HISTORY: 84 year old woman with a history of persistent A. fib, (s/p failed ablation and on anticoagulation), reflux disease, macular degeneration, hypertension, osteopenia, anxiety, hypothyroidism, eczema, and overweight state, who presents for a full night titration study to treat her OSA. Her home sleep test from 12/23/17 showed moderate obstructive sleep apnea with a total AHI of 16.9/hour and O2 nadir of 86%. The patient endorsed the Epworth Sleepiness Scale at 6/24 points, BMI of 28.3 kg/m2. The patient's neck circumference measured 13.5 inches.  CURRENT MEDICATIONS: Tylenol, Citracal, Omega-3, Lidex, Synthroid, Cozaar, Mobic, Lopressor, Ocuvite, Protonix, Miralax, Phenergan, Evista, Carafate, Ultram, Coumadin.   PROCEDURE:  This is a multichannel digital polysomnogram utilizing the SomnoStar 11.2 system.  Electrodes and sensors were applied and monitored per AASM Specifications.   EEG, EOG, Chin and Limb EMG, were sampled at 200 Hz.  ECG, Snore and Nasal Pressure, Thermal Airflow, Respiratory Effort, CPAP Flow and Pressure, Oximetry was sampled at 50 Hz. Digital video and audio were recorded.      The patient was fitted with a medium P10 nasal pillows. CPAP was initiated at 5 cmH20 with heated humidity per AASM standards and pressure was advanced to 8 cm H20 because of hypopneas, apneas and desaturations. At a PAP pressure of 8 cmH20, there was a reduction of the AHI to 2.8/hour with O2 nadir of 92%, but only non-supine and NREM sleep achieved.   Lights Out was at 22:44 and Lights On at 04:58. Total recording time (TRT) was 374 minutes, with a total sleep time (TST) of 146.5 minutes. The patient's sleep latency was 30 minutes, which is delayed. REM latency was absent. The sleep efficiency was 39.2%, which is  markedly reduced.    SLEEP ARCHITECTURE: WASO (Wake after sleep onset) was 185.5 minutes with one longer period of wakefulness and moderate sleep fragmentation noted. There were 25.5 minutes in Stage N1, 116 minutes Stage N2, 5 minutes Stage N3 and 0 minutes in Stage REM. The percentage of Stage N1 was 17.4%, which is markedly increased, Stage N2 was 79.2%, which is markedly increased, Stage N3 was 3.4% and Stage R (REM sleep) was absent.   RESPIRATORY ANALYSIS:  There was a total of 8 respiratory events: 7 obstructive apneas, 0 central apneas and 0 mixed apneas with a total of 7 apneas and an apnea index (AI) of 2.9 /hour. There were 1 hypopneas with a hypopnea index of .4/hour. The patient also had 0 respiratory event related arousals (RERAs).      The total APNEA/HYPOPNEA INDEX  (AHI) was 3.3 /hour and the total RESPIRATORY DISTURBANCE INDEX was 3.3 /hour  0 events occurred in REM sleep and 8 events in NREM. The REM AHI was 0 /hour versus a non-REM AHI of 3.3 /hour.  The patient spent 0 minutes of total sleep time in the supine position and 147 minutes in non-supine. The supine AHI was n/a, versus a non-supine AHI of 3.3.  OXYGEN SATURATION & C02:  The baseline 02 saturation was 93%, with the lowest being 88%. Time spent below 89% saturation equaled 6 minutes.  PERIODIC LIMB MOVEMENTS:  The patient had a total of 0 Periodic Limb Movements. The Periodic Limb Movement (PLM) index was 0 and the PLM Arousal index was 0 /hour.  Audio  and video analysis did not show any abnormal or unusual movements, behaviors, phonations or vocalizations. The patient took 1 bathroom break. The EKG was irregular, in keeping with atrial fibrillation.  Post-study, the patient indicated that sleep was better than usual.   DIAGNOSIS:  1. Obstructive Sleep Apnea  2. Dysfunctions associated with sleep stages or arousals from sleep 3. Abnormal EKG  RECOMMENDATIONS: 1. This study demonstrates improvement of the patient's  obstructive sleep apnea with CPAP therapy. However, the study was limited by poor sleep efficiency, poor sleep consolidation, absence of supine sleep and absence of REM sleep. Nevertheless, I will recommend a home CPAP treatment pressure of 8 cm via medium nasal pillows with heated humidity. The patient should be reminded to be fully compliant with PAP therapy to improve sleep related symptoms and decrease long term cardiovascular risks. The patient should be reminded, that it may take up to 3 months to get fully used to using PAP with all planned sleep. The earlier full compliance is achieved, the better long term compliance tends to be. Please note that untreated obstructive sleep apnea may carry additional perioperative morbidity. Patients with significant obstructive sleep apnea should receive perioperative PAP therapy and the surgeons and particularly the anesthesiologist should be informed of the diagnosis and the severity of the sleep disordered breathing. 2. This study shows sleep fragmentation and abnormal sleep stage percentages; these are nonspecific findings and per se do not signify an intrinsic sleep disorder or a cause for the patient's sleep-related symptoms. Causes include (but are not limited to) the first night effect of the sleep study, circadian rhythm disturbances, medication effect or an underlying mood disorder or medical problem.  3. The study showed an irregular EKG; clinical correlation is recommended. 4. The patient should be cautioned not to drive, work at heights, or operate dangerous or heavy equipment when tired or sleepy. Review and reiteration of good sleep hygiene measures should be pursued with any patient. 5. The patient will be seen in follow-up in the sleep clinic at [facility] for discussion of the test results, symptom and treatment compliance review, further management strategies, etc. The referring provider will be notified of the test results.  I certify that I have  reviewed the entire raw data recording prior to the issuance of this report in accordance with the Standards of Accreditation of the American Academy of Sleep Medicine (AASM)  Star Age, MD, PhD [facility]) Diplomat, ABPN (Neurology and Sleep)

## 2018-02-23 NOTE — Patient Instructions (Addendum)
Description   Continue on same dosage 1 tablet daily except 1/2 tablet on Wednesdays. Recheck in 5-6 weeks. Call us with any medication changes # [phone] Coumadin Clinic.

## 2018-02-23 NOTE — Progress Notes (Signed)
Patient referred by Dr. Philip Aspen, seen by me on 12/07/17, HST on 12/23/17, CPAP titration study on 02/16/18.  Please call and inform patient that I have entered an order for treatment with positive airway pressure (PAP) treatment for obstructive sleep apnea (OSA). She did fairly during the latest sleep study with CPAP, did not sleep well. Nevertheless, I would recommend treatment at home with CPAP. We will, therefore, arrange for a machine for home use through a DME (durable medical equipment) company of Her choice; and I will see the patient back in follow-up in about 10 weeks. Please also explain to the patient that I will be looking out for compliance data, which can be downloaded from the machine (stored on an SD card, that is inserted in the machine) or via remote access through a modem, that is built into the machine. At the time of the followup appointment we will discuss sleep study results and how it is going with PAP treatment at home. Please advise patient to bring Her machine at the time of the first FU visit, even though this is cumbersome. Bringing the machine for every visit after that will likely not be needed, but often helps for the first visit to troubleshoot if needed. Please re-enforce the importance of compliance with treatment and the need for [place] to monitor compliance data - often an insurance requirement and actually good feedback for the patient as far as how they are doing.  Also remind patient, that any interim PAP machine or mask issues should be first addressed with the DME company, as they can often help better with technical and mask fit issues. Please ask if patient has a preference regarding DME company.  Please also make sure, the patient has a follow-up appointment with me in about 10 weeks from the setup date, thanks. May see one of our nurse practitioners if needed for proper timing of the FU appointment.  Please fax or rout report to the referring provider. Thanks,   Star Age, MD, PhD [facility])

## 2018-03-30 ENCOUNTER — Ambulatory Visit (INDEPENDENT_AMBULATORY_CARE_PROVIDER_SITE_OTHER): Payer: Medicare Other

## 2018-03-30 DIAGNOSIS — Z5181 Encounter for therapeutic drug level monitoring: Secondary | ICD-10-CM | POA: Diagnosis not present

## 2018-03-30 DIAGNOSIS — I4891 Unspecified atrial fibrillation: Secondary | ICD-10-CM

## 2018-03-30 LAB — POCT INR: INR: 3.4 — AB (ref 2.0–3.0)

## 2018-03-30 NOTE — Patient Instructions (Signed)
Description   Skip today's dosage of Coumadin, then resume same dosage 1 tablet daily except 1/2 tablet on Wednesdays. Recheck in 4 weeks. Call us with any medication changes # [phone] Coumadin Clinic.

## 2018-03-31 DIAGNOSIS — Z6828 Body mass index (BMI) 28.0-28.9, adult: Secondary | ICD-10-CM | POA: Diagnosis not present

## 2018-03-31 DIAGNOSIS — R21 Rash and other nonspecific skin eruption: Secondary | ICD-10-CM | POA: Diagnosis not present

## 2018-03-31 DIAGNOSIS — I4821 Permanent atrial fibrillation: Secondary | ICD-10-CM | POA: Diagnosis not present

## 2018-03-31 DIAGNOSIS — I10 Essential (primary) hypertension: Secondary | ICD-10-CM | POA: Diagnosis not present

## 2018-03-31 DIAGNOSIS — J069 Acute upper respiratory infection, unspecified: Secondary | ICD-10-CM | POA: Diagnosis not present

## 2018-04-01 ENCOUNTER — Telehealth: Payer: Self-pay

## 2018-04-01 NOTE — Telephone Encounter (Signed)
Pt called stating that PCP ordered zpack and wanted to know if it effects coumadin. Instructed pt that it would not.

## 2018-04-13 DIAGNOSIS — H35351 Cystoid macular degeneration, right eye: Secondary | ICD-10-CM | POA: Diagnosis not present

## 2018-04-13 DIAGNOSIS — H353221 Exudative age-related macular degeneration, left eye, with active choroidal neovascularization: Secondary | ICD-10-CM | POA: Diagnosis not present

## 2018-04-13 DIAGNOSIS — H353212 Exudative age-related macular degeneration, right eye, with inactive choroidal neovascularization: Secondary | ICD-10-CM | POA: Diagnosis not present

## 2018-04-13 DIAGNOSIS — H353113 Nonexudative age-related macular degeneration, right eye, advanced atrophic without subfoveal involvement: Secondary | ICD-10-CM | POA: Diagnosis not present

## 2018-04-13 DIAGNOSIS — H3570 Unspecified separation of retinal layers: Secondary | ICD-10-CM | POA: Diagnosis not present

## 2018-04-27 ENCOUNTER — Ambulatory Visit (INDEPENDENT_AMBULATORY_CARE_PROVIDER_SITE_OTHER): Payer: Medicare Other

## 2018-04-27 DIAGNOSIS — I4891 Unspecified atrial fibrillation: Secondary | ICD-10-CM | POA: Diagnosis not present

## 2018-04-27 DIAGNOSIS — Z5181 Encounter for therapeutic drug level monitoring: Secondary | ICD-10-CM

## 2018-04-27 LAB — POCT INR: INR: 2.4 (ref 2.0–3.0)

## 2018-04-27 NOTE — Patient Instructions (Signed)
Please continue same dosage 1 tablet daily except 1/2 tablet on Wednesdays. Recheck in 6 weeks. Call us with any medication changes # [phone] Coumadin Clinic.

## 2018-05-10 DIAGNOSIS — I482 Chronic atrial fibrillation, unspecified: Secondary | ICD-10-CM | POA: Diagnosis not present

## 2018-05-10 DIAGNOSIS — E038 Other specified hypothyroidism: Secondary | ICD-10-CM | POA: Diagnosis not present

## 2018-05-10 DIAGNOSIS — Z6828 Body mass index (BMI) 28.0-28.9, adult: Secondary | ICD-10-CM | POA: Diagnosis not present

## 2018-05-10 DIAGNOSIS — G4733 Obstructive sleep apnea (adult) (pediatric): Secondary | ICD-10-CM | POA: Diagnosis not present

## 2018-05-25 DIAGNOSIS — H43812 Vitreous degeneration, left eye: Secondary | ICD-10-CM | POA: Diagnosis not present

## 2018-05-25 DIAGNOSIS — G4733 Obstructive sleep apnea (adult) (pediatric): Secondary | ICD-10-CM | POA: Diagnosis not present

## 2018-05-25 DIAGNOSIS — M79671 Pain in right foot: Secondary | ICD-10-CM | POA: Diagnosis not present

## 2018-05-25 DIAGNOSIS — I10 Essential (primary) hypertension: Secondary | ICD-10-CM | POA: Diagnosis not present

## 2018-05-25 DIAGNOSIS — Z6829 Body mass index (BMI) 29.0-29.9, adult: Secondary | ICD-10-CM | POA: Diagnosis not present

## 2018-05-25 DIAGNOSIS — H353123 Nonexudative age-related macular degeneration, left eye, advanced atrophic without subfoveal involvement: Secondary | ICD-10-CM | POA: Diagnosis not present

## 2018-05-25 DIAGNOSIS — M79604 Pain in right leg: Secondary | ICD-10-CM | POA: Diagnosis not present

## 2018-05-25 DIAGNOSIS — L988 Other specified disorders of the skin and subcutaneous tissue: Secondary | ICD-10-CM | POA: Diagnosis not present

## 2018-05-25 DIAGNOSIS — M1711 Unilateral primary osteoarthritis, right knee: Secondary | ICD-10-CM | POA: Diagnosis not present

## 2018-05-25 DIAGNOSIS — M545 Low back pain: Secondary | ICD-10-CM | POA: Diagnosis not present

## 2018-05-25 DIAGNOSIS — R110 Nausea: Secondary | ICD-10-CM | POA: Diagnosis not present

## 2018-05-25 DIAGNOSIS — I482 Chronic atrial fibrillation, unspecified: Secondary | ICD-10-CM | POA: Diagnosis not present

## 2018-05-25 DIAGNOSIS — H353221 Exudative age-related macular degeneration, left eye, with active choroidal neovascularization: Secondary | ICD-10-CM | POA: Diagnosis not present

## 2018-05-26 ENCOUNTER — Other Ambulatory Visit: Payer: Self-pay | Admitting: Internal Medicine

## 2018-05-26 DIAGNOSIS — R110 Nausea: Secondary | ICD-10-CM

## 2018-05-28 ENCOUNTER — Ambulatory Visit (INDEPENDENT_AMBULATORY_CARE_PROVIDER_SITE_OTHER): Payer: Medicare Other | Admitting: Pharmacist

## 2018-05-28 DIAGNOSIS — Z5181 Encounter for therapeutic drug level monitoring: Secondary | ICD-10-CM | POA: Diagnosis not present

## 2018-05-28 DIAGNOSIS — I4891 Unspecified atrial fibrillation: Secondary | ICD-10-CM

## 2018-05-28 LAB — POCT INR: INR: 2.6 (ref 2.0–3.0)

## 2018-05-28 NOTE — Patient Instructions (Signed)
Description   Please continue same dosage 1 tablet daily except 1/2 tablet on Wednesdays. Recheck in 6 weeks. Call us with any medication changes # [phone] Coumadin Clinic.

## 2018-05-31 ENCOUNTER — Encounter: Payer: Self-pay | Admitting: Adult Health

## 2018-06-01 NOTE — Progress Notes (Addendum)
[facility]  PATIENT: Sylvia [name] DOB: May 11, 1932   REASON FOR VISIT: Follow-up for newly diagnosed obstructive sleep apnea here for initial CPAP compliance HISTORY FROM: Patient and husband    HISTORY OF PRESENT ILLNESS: UPDATE 10/19/2020CM Sylvia [name], 85 year old female returns for follow-up with newly diagnosed obstructive sleep apnea here for initial CPAP compliance.  CPAP titration performed on 02/16/2018. - Data dated 05/02/2018-05/31/2018 shows compliance greater than 4 hours at 83% for 25 days.  She did not use it much the other days she had a cough and sinus drainage.  Average usage 5 hours 47 minutes.  Set pressure 8 cm EPR level 3 leak 95th percentile 31.4.  AHI 7.1 ESS 4.  She returns for reevaluation 12/07/17 SAMs. Sylvia [name] is an 85 year old right-handed woman with an underlying medical history of persistent A. fib, status post failed ablation and on anticoagulation, reflux disease, macular degeneration, hypertension, osteopenia, anxiety, hypothyroidism, eczema, and overweight state, who reports chronic difficulty with her sleep including difficulty initiating sleep and maintaining sleep. Over-the-counter medication she has tried in the past made her groggy. She has had vivid dreams and sometimes nightmares. She has a long-standing history of sleep talking and also sleepwalking as a child. Sometimes she has sat up in bed per husband. She has occasional sleep talking. First thing in the morning she feels reasonably well rested but is tired during the day. She tries not to nap during the day. She has nocturia about twice per average night. I reviewed your office note from 11/10/2017, which you kindly included. She complains of nonrestorative sleep and difficulty maintaining sleep as well as initiating sleep. Her Epworth sleepiness score is 6 out of 24, fatigue score is 43 out of 63. Per husband, she snores mildly and only occasionally. Her bedtime is generally around  10:15 PM. She does not watch TV while in bed. Rise time is typically between 5 and 6 AM.  REVIEW OF SYSTEMS: Full 14 system review of systems performed and notable only for those listed, all others are neg:  Constitutional: neg  Cardiovascular: Atrial fibrillation Ear/Nose/Throat: neg  Skin: neg Eyes: neg Respiratory: neg Gastroitestinal: neg  Hematology/Lymphatic: neg  Endocrine: neg Musculoskeletal: Hip pain Allergy/Immunology: neg Neurological: neg Psychiatric: Anxiety Sleep : Newly diagnosed obstructive sleep apnea with CPAP   ALLERGIES: Allergies  Allergen Reactions  . Metoclopramide Hcl     REACTION: nervous, climbs the wall  . Penicillins     REACTION: hives  . Pravastatin   . Sulfonamide Derivatives     REACTION: rash    HOME MEDICATIONS: Outpatient Medications Prior to Visit  Medication Sig Dispense Refill  . acetaminophen (TYLENOL) 325 MG tablet Take by mouth every 6 (six) hours as needed for mild pain.    . calcium citrate-vitamin D (CITRACAL+D) 315-200 MG-UNIT per tablet Take 1 tablet by mouth daily.      . fish oil-omega-3 fatty acids 1000 MG capsule Take 1 g by mouth 2 (two) times daily.     . fluocinonide (LIDEX) 0.05 % external solution 2 (two) times daily. 1-2 GTTS IN EARS TWICE WEEKLY AS NEEDED     . hydroxypropyl methylcellulose / hypromellose (ISOPTO TEARS / GONIOVISC) 2.5 % ophthalmic solution Place 1 drop into both eyes as needed for dry eyes.    Marland Kitchen levothyroxine (SYNTHROID, LEVOTHROID) 75 MCG tablet Take 75 mcg by mouth daily.      Marland Kitchen losartan (COZAAR) 100 MG tablet Take 100 mg by mouth daily.      Marland Kitchen  meloxicam (MOBIC) 15 MG tablet Take 15 mg by mouth daily as needed for pain.    . metoprolol tartrate (LOPRESSOR) 50 MG tablet Take 50 mg by mouth 2 (two) times daily.    . Multiple Vitamins-Minerals (OCUVITE PRESERVISION PO) Take 1 tablet by mouth 2 (two) times daily.    . pantoprazole (PROTONIX) 40 MG tablet Take 40 mg by mouth 2 (two) times daily.    .  polyethylene glycol powder (GLYCOLAX/MIRALAX) powder Take 17 g by mouth daily. PRN      . promethazine (PHENERGAN) 25 MG tablet Take 25 mg by mouth 3 (three) times daily as needed.  0  . raloxifene (EVISTA) 60 MG tablet Take 60 mg by mouth daily.      . sucralfate (CARAFATE) 1 GM/10ML suspension Take 10 mLs (1 g total) by mouth 4 (four) times daily -  with meals and at bedtime. 420 mL 0  . traMADol (ULTRAM) 50 MG tablet Take 50 mg by mouth every 8 (eight) hours as needed.    . warfarin (COUMADIN) 5 MG tablet TAKE 1/2 to 1 TABLET BY MOUTH ONCE DAILY AS  DIRECTED  BY  COUMADIN  CLINIC 90 tablet 0  . warfarin (COUMADIN) 5 MG tablet TAKE 1/2 TO 1 (ONE-HALF TO ONE) TABLET BY MOUTH ONCE DAILY AS DIRECTED BY COUMADIN CLINIC 90 tablet 0   No facility-administered medications prior to visit.     PAST MEDICAL HISTORY: Past Medical History:  Diagnosis Date  . Atrial fibrillation ([place])    ATRIAL FIBRILLATION S/P  PVI 6/11  . CAD (coronary artery disease)    a. 03/2002: cath showing 20% OM1 stenosis, EF at 65%, no WMA. No significant CAD.   Marland Kitchen GERD (gastroesophageal reflux disease)   . Hyperlipidemia   . Hypertension   . Macular degeneration   . SVT (supraventricular tachycardia) ([place])     PAST SURGICAL HISTORY: Past Surgical History:  Procedure Laterality Date  . ABLATION OF DYSRHYTHMIC FOCUS  09/2009   s/p PVI by JA  . TOTAL ABDOMINAL HYSTERECTOMY      FAMILY HISTORY: Family History  Problem Relation Age of Onset  . Stroke Other   . Coronary artery disease Other     SOCIAL HISTORY: Social History   Socioeconomic History  . Marital status: Married    Spouse name: Not on file  . Number of children: Not on file  . Years of education: Not on file  . Highest education level: Not on file  Occupational History  . Not on file  Social Needs  . Financial resource strain: Not on file  . Food insecurity:    Worry: Not on file    Inability: Not on file  . Transportation needs:     Medical: Not on file    Non-medical: Not on file  Tobacco Use  . Smoking status: Never Smoker  . Smokeless tobacco: Never Used  Substance and Sexual Activity  . Alcohol use: No  . Drug use: No  . Sexual activity: Not on file  Lifestyle  . Physical activity:    Days per week: Not on file    Minutes per session: Not on file  . Stress: Not on file  Relationships  . Social connections:    Talks on phone: Not on file    Gets together: Not on file    Attends religious service: Not on file    Active member of club or organization: Not on file    Attends meetings of clubs  or organizations: Not on file    Relationship status: Not on file  . Intimate partner violence:    Fear of current or ex partner: Not on file    Emotionally abused: Not on file    Physically abused: Not on file    Forced sexual activity: Not on file  Other Topics Concern  . Not on file  Social History Narrative   Lives with spouse     PHYSICAL EXAM  Vitals:   06/02/18 1346  BP: (!) 142/68  Pulse: 84  SpO2: 96%  Weight: 151 lb 3.2 oz (68.6 kg)  Height: 5' 1.5" (1.562 m)   Body mass index is 28.11 kg/m.  Generalized: Well developed, in no acute distress  Head: normocephalic and atraumatic,. Oropharynx benign  Neck: Supple,  Musculoskeletal: No deformity   Neurological examination   Mentation: Alert oriented to time, place, history taking. Attention span and concentration appropriate. Recent and remote memory intact.  Follows all commands speech and language fluent.   Cranial nerve II-XII: Pupils were equal round reactive to light extraocular movements were full, visual field were full on confrontational test. Facial sensation and strength were normal. hearing was intact to finger rubbing bilaterally. Uvula tongue midline. head turning and shoulder shrug were normal and symmetric.Tongue protrusion into cheek strength was normal. Motor: normal bulk and tone, full strength in the BUE, BLE,  Sensory:  normal and symmetric to light touch,  Coordination: finger-nose-finger,  no dysmetria Gait and Station: Rising up from seated position without assistance, normal stance,  moderate stride, good arm swing, smooth turning, no assistive device DIAGNOSTIC DATA (LABS, IMAGING, TESTING) - I reviewed patient records, labs, notes, testing and imaging myself where available.  Lab Results  Component Value Date   WBC 7.6 07/14/2017   HGB 14.9 07/14/2017   HCT 44.6 07/14/2017   MCV 92.7 07/14/2017   PLT 222 07/14/2017      Component Value Date/Time   NA 138 07/14/2017 0955   K 3.1 (L) 07/14/2017 0955   CL 101 07/14/2017 0955   CO2 24 07/14/2017 0955   GLUCOSE 161 (H) 07/14/2017 0955   BUN 23 (H) 07/14/2017 0955   CREATININE 0.97 07/14/2017 0955   CREATININE 0.97 (H) 03/12/2015 1010   CALCIUM 9.7 07/14/2017 0955   PROT 7.4 07/14/2017 0955   ALBUMIN 3.7 07/14/2017 0955   AST 23 07/14/2017 0955   ALT 15 07/14/2017 0955   ALKPHOS 53 07/14/2017 0955   BILITOT 1.2 07/14/2017 0955   GFRNONAA 53 (L) 07/14/2017 0955   GFRNONAA 55 (L) 03/24/2014 1057   GFRAA >60 07/14/2017 0955   GFRAA 63 03/24/2014 1057    Lab Results  Component Value Date   TSH 0.611 03/24/2014      ASSESSMENT AND PLAN   JASMINA GENDRON is a very pleasant 85 y.o.-year old female with an underlying medical history of persistent A. fib, status post failed ablation and on anticoagulation, reflux disease, macular degeneration, hypertension, osteopenia, anxiety, hypothyroidism, eczema, and overweight state, recent sleep study in November shows obstructive sleep apnea and she was placed on CPAP.   Data dated 05/02/2018-05/31/2018 shows compliance greater than 4 hours at 83% for 25 days.  She did not use it much the other days she had a cough and sinus drainage.  Average usage 5 hours 47 minutes.  Set pressure 8 cm EPR level 3 leak 95th percentile 31.4.  AHI 7.1 ESS 4.   PLAN: CPAP compliance 83% greater than 4 hours Continue  same  settings Patient has already called [facility] to get another mask due to leak Follow-up in 6 months Dennie Bible, [facility], [facility], [facility] [address], [place], [place] [address] [phone]  I reviewed the above note and documentation by the Nurse Practitioner and agree with the history, physical exam, assessment and plan as outlined above. I was immediately available for face-to-face consultation.  Star Age, MD, PhD [facility])

## 2018-06-02 ENCOUNTER — Encounter: Payer: Self-pay | Admitting: Nurse Practitioner

## 2018-06-02 ENCOUNTER — Ambulatory Visit (INDEPENDENT_AMBULATORY_CARE_PROVIDER_SITE_OTHER): Payer: Medicare Other | Admitting: Nurse Practitioner

## 2018-06-02 ENCOUNTER — Ambulatory Visit
Admission: RE | Admit: 2018-06-02 | Discharge: 2018-06-02 | Disposition: A | Discharge status: Home / Self Care | Payer: Medicare Other | Source: Ambulatory Visit | Attending: Internal Medicine | Admitting: Internal Medicine

## 2018-06-02 DIAGNOSIS — G4733 Obstructive sleep apnea (adult) (pediatric): Secondary | ICD-10-CM

## 2018-06-02 DIAGNOSIS — Z9989 Dependence on other enabling machines and devices: Secondary | ICD-10-CM | POA: Diagnosis not present

## 2018-06-02 DIAGNOSIS — K8020 Calculus of gallbladder without cholecystitis without obstruction: Secondary | ICD-10-CM | POA: Diagnosis not present

## 2018-06-02 DIAGNOSIS — R110 Nausea: Secondary | ICD-10-CM

## 2018-06-02 NOTE — Patient Instructions (Signed)
CPAP compliance 83% greater than 4 hours Continue same settings Follow-up in 6 months

## 2018-06-07 ENCOUNTER — Other Ambulatory Visit (HOSPITAL_COMMUNITY): Payer: Self-pay | Admitting: Internal Medicine

## 2018-06-07 DIAGNOSIS — R110 Nausea: Secondary | ICD-10-CM

## 2018-06-07 DIAGNOSIS — K8020 Calculus of gallbladder without cholecystitis without obstruction: Secondary | ICD-10-CM

## 2018-06-10 ENCOUNTER — Ambulatory Visit (HOSPITAL_COMMUNITY)
Admission: RE | Admit: 2018-06-10 | Discharge: 2018-06-10 | Disposition: A | Discharge status: Home / Self Care | Payer: Medicare Other | Source: Ambulatory Visit | Attending: Internal Medicine | Admitting: Internal Medicine

## 2018-06-10 DIAGNOSIS — K8020 Calculus of gallbladder without cholecystitis without obstruction: Secondary | ICD-10-CM | POA: Diagnosis not present

## 2018-06-10 DIAGNOSIS — R948 Abnormal results of function studies of other organs and systems: Secondary | ICD-10-CM | POA: Diagnosis not present

## 2018-06-10 DIAGNOSIS — R110 Nausea: Secondary | ICD-10-CM | POA: Diagnosis not present

## 2018-06-10 MED ORDER — TECHNETIUM TC 99M MEBROFENIN IV KIT
5.0000 | PACK | Freq: Once | INTRAVENOUS | Status: AC | PRN
  Administered 2018-06-10: 5 via INTRAVENOUS

## 2018-06-21 ENCOUNTER — Ambulatory Visit (INDEPENDENT_AMBULATORY_CARE_PROVIDER_SITE_OTHER): Payer: Medicare Other

## 2018-06-21 ENCOUNTER — Other Ambulatory Visit: Payer: Self-pay | Admitting: Cardiology

## 2018-06-21 DIAGNOSIS — Z5181 Encounter for therapeutic drug level monitoring: Secondary | ICD-10-CM

## 2018-06-21 DIAGNOSIS — I4891 Unspecified atrial fibrillation: Secondary | ICD-10-CM

## 2018-06-21 LAB — POCT INR: INR: 3.9 — AB (ref 2.0–3.0)

## 2018-06-21 NOTE — Patient Instructions (Signed)
Description   Hold warfarin today and then take 1/2 tablet tomorrow. Then continue same dosage 1 tablet daily except 1/2 tablet on Wednesdays. Recheck on 3/31. Call us with any medication changes # [phone] Coumadin Clinic.

## 2018-06-22 ENCOUNTER — Encounter (INDEPENDENT_AMBULATORY_CARE_PROVIDER_SITE_OTHER): Payer: Self-pay | Admitting: Physical Medicine and Rehabilitation

## 2018-06-22 ENCOUNTER — Ambulatory Visit (INDEPENDENT_AMBULATORY_CARE_PROVIDER_SITE_OTHER): Payer: Medicare Other | Admitting: Physical Medicine and Rehabilitation

## 2018-06-22 ENCOUNTER — Ambulatory Visit (INDEPENDENT_AMBULATORY_CARE_PROVIDER_SITE_OTHER): Payer: Self-pay

## 2018-06-22 VITALS — BP 171/82 | HR 60

## 2018-06-22 DIAGNOSIS — M4316 Spondylolisthesis, lumbar region: Secondary | ICD-10-CM | POA: Diagnosis not present

## 2018-06-22 DIAGNOSIS — M545 Low back pain, unspecified: Secondary | ICD-10-CM

## 2018-06-22 DIAGNOSIS — M5416 Radiculopathy, lumbar region: Secondary | ICD-10-CM | POA: Diagnosis not present

## 2018-06-22 MED ORDER — BETAMETHASONE SOD PHOS & ACET 6 (3-3) MG/ML IJ SUSP
12.0000 mg | Freq: Once | INTRAMUSCULAR | Status: AC
  Administered 2018-06-22: 12 mg

## 2018-06-22 NOTE — Progress Notes (Signed)
  Numeric Pain Rating Scale and Functional Assessment Average Pain 8 Pain Right Now 0 My pain is constant, dull and aching Pain is worse with: walking and standing Pain improves with: rest, heat/ice and medication   In the last MONTH (on 0-10 scale) has pain interfered with the following?  1. General activity like being  able to carry out your everyday physical activities such as walking, climbing stairs, carrying groceries, or moving a chair?  Rating(9)  2. Relation with others like being able to carry out your usual social activities and roles such as  activities at home, at work and in your community. Rating(9)  3. Enjoyment of life such that you have  been bothered by emotional problems such as feeling anxious, depressed or irritable?  Rating(8)

## 2018-06-24 DIAGNOSIS — M79671 Pain in right foot: Secondary | ICD-10-CM | POA: Diagnosis not present

## 2018-06-24 DIAGNOSIS — I129 Hypertensive chronic kidney disease with stage 1 through stage 4 chronic kidney disease, or unspecified chronic kidney disease: Secondary | ICD-10-CM | POA: Diagnosis not present

## 2018-06-24 DIAGNOSIS — Z6828 Body mass index (BMI) 28.0-28.9, adult: Secondary | ICD-10-CM | POA: Diagnosis not present

## 2018-06-24 DIAGNOSIS — G4733 Obstructive sleep apnea (adult) (pediatric): Secondary | ICD-10-CM | POA: Diagnosis not present

## 2018-06-24 DIAGNOSIS — K8020 Calculus of gallbladder without cholecystitis without obstruction: Secondary | ICD-10-CM | POA: Diagnosis not present

## 2018-06-24 DIAGNOSIS — I482 Chronic atrial fibrillation, unspecified: Secondary | ICD-10-CM | POA: Diagnosis not present

## 2018-06-29 ENCOUNTER — Telehealth: Payer: Self-pay | Admitting: Nurse Practitioner

## 2018-06-29 ENCOUNTER — Encounter (INDEPENDENT_AMBULATORY_CARE_PROVIDER_SITE_OTHER): Payer: Self-pay | Admitting: Physical Medicine and Rehabilitation

## 2018-06-29 NOTE — Telephone Encounter (Signed)
Pt husband(on DPR) has called to inform that he would like to change the DME for pt's cpap supplies.  Pt husband has had difficulty with [employer] in getting mask and other supplies.  Please call

## 2018-06-29 NOTE — Telephone Encounter (Addendum)
No answer.   Sent CMM to [place].

## 2018-06-29 NOTE — Procedures (Signed)
Lumbosacral Transforaminal Epidural Steroid Injection - Sub-Pedicular Approach with Fluoroscopic Guidance  Patient: Sylvia [name]      Date of Birth: [birth year] MRN: [id number] PCP: Leanna Battles, MD      Visit Date: 06/22/2018   Universal Protocol:    Date/Time: 06/22/2018  Consent Given By: the patient  Position: PRONE  Additional Comments: Vital signs were monitored before and after the procedure. Patient was prepped and draped in the usual sterile fashion. The correct patient, procedure, and site was verified.   Injection Procedure Details:  Procedure Site One Meds Administered:  Meds ordered this encounter  Medications  . betamethasone acetate-betamethasone sodium phosphate (CELESTONE) injection 12 mg    Laterality: Right  Location/Site:  L5-S1  Needle size: 22 G  Needle type: Spinal  Needle Placement: Transforaminal  Findings:    -Comments: Excellent flow of contrast along the nerve and into the epidural space.  Procedure Details: After squaring off the end-plates to get a true AP view, the C-arm was positioned so that an oblique view of the foramen as noted above was visualized. The target area is just inferior to the "nose of the scotty dog" or sub pedicular. The soft tissues overlying this structure were infiltrated with 2-3 ml. of 1% Lidocaine without Epinephrine.  The spinal needle was inserted toward the target using a "trajectory" view along the fluoroscope beam.  Under AP and lateral visualization, the needle was advanced so it did not puncture dura and was located close the 6 O'Clock position of the pedical in AP tracterory. Biplanar projections were used to confirm position. Aspiration was confirmed to be negative for CSF and/or blood. A 1-2 ml. volume of Isovue-250 was injected and flow of contrast was noted at each level. Radiographs were obtained for documentation purposes.   After attaining the desired flow of contrast documented above, a 0.5  to 1.0 ml test dose of 0.25% Marcaine was injected into each respective transforaminal space.  The patient was observed for 90 seconds post injection.  After no sensory deficits were reported, and normal lower extremity motor function was noted,   the above injectate was administered so that equal amounts of the injectate were placed at each foramen (level) into the transforaminal epidural space.   Additional Comments:  The patient tolerated the procedure well Dressing: 2 x 2 sterile gauze and Band-Aid    Post-procedure details: Patient was observed during the procedure. Post-procedure instructions were reviewed.  Patient left the clinic in stable condition.

## 2018-06-29 NOTE — Telephone Encounter (Signed)
I spoke to husband of pt.  Since 05-28-18 husband has been trying to get supplies for his wife, new mask,, nose piece and hose.  Has tried to call and no answer, then has spoken to several people and has not been able to get response.  Last time spoke with taylor, 06-23-18.  cpap machine being paid for by medicare.  He does not want to continue with [employer] due to response.  Change to [employer]?  Please advise.

## 2018-06-29 NOTE — Telephone Encounter (Signed)
I LMVM for pt and husband that did hear back from [place].  RT is to reach out to pt about mask.  I relayed that I will f/u on tomorrow.  I relayed message from CM/NP that due to [facility] changes it might be better to stay with [employer].

## 2018-06-29 NOTE — Telephone Encounter (Signed)
I received message from [employer]: TK.  She stated:   He has called in about a certain mask that they and the RT talked about at the last visit.. I have put in a message with the RT to give him a call last week when he called in.. I will let them know that he has now reached out to y'all. Sorry about this.

## 2018-06-29 NOTE — Telephone Encounter (Signed)
Per Cyril Mourning, wait for response from [employer]. Changing to another company may be worse especially since [facility] is going through a system wide change. Marland Kitchen

## 2018-06-29 NOTE — Progress Notes (Signed)
Sylvia [name] - 85 y.o. female MRN [id number]  Date of birth: 09-10-1932  Office Visit Note: Visit Date: 06/22/2018 PCP: Leanna Battles, MD Referred by: Leanna Battles, MD  Subjective: Chief Complaint  Patient presents with  . Lower Back - Pain  . Right Hip - Pain  . Right Lower Leg - Pain  . Right Ankle - Pain   HPI: Sylvia [name] is a 85 y.o. female who comes in today At the request of Dr. Janie Morning for evaluation management of chronic worsening low back pain but now with several weeks of severe right radicular leg pain in L5 distribution to the ankle.  Patient is present with her husband who provides some of the history.  She reports 6 to 8 weeks of severe 8 out of 10 pain that is very uncomfortable and really limiting what she can do.  She is miserable.  She reports pain is worse with walking and standing but improves with rest.  She does get some relief with heat ice and medication.  She is tried several medications through Dr. Philip Aspen including prednisone and tramadol.  She reports no real relief overall.  She has not had any history of spine surgery.  She has not noted any focal weakness but does feel like she has some give way weakness at times with walking to the pain.  She reports difficulty sleeping getting comfortable with an aching pain at night.  Again significant drop-off in pain with sitting.  She denies any left-sided symptoms.  She has had no fevers chills night sweats or night pain.  She has had no real red flag complaints.  X-rays were obtained through Dr. Philip Aspen and these are reviewed below.  She has not had specific physical therapy to the incredibly painful nerve pain.  She has an extensive cardiac heart history with anticoagulation using Coumadin for atrial fibrillation.  She has not had MRI.  Review of Systems  Constitutional: Negative for chills, fever, malaise/fatigue and weight loss.  HENT: Negative for hearing loss and sinus pain.   Eyes: Negative  for blurred vision, double vision and photophobia.  Respiratory: Negative for cough and shortness of breath.   Cardiovascular: Negative for chest pain, palpitations and leg swelling.  Gastrointestinal: Negative for abdominal pain, nausea and vomiting.  Genitourinary: Negative for flank pain.  Musculoskeletal: Positive for back pain. Negative for myalgias.       Right hip and leg pain  Skin: Negative for itching and rash.  Neurological: Negative for tremors, focal weakness and weakness.  Endo/Heme/Allergies: Negative.   Psychiatric/Behavioral: Negative for depression.  All other systems reviewed and are negative.  Otherwise per HPI.  Assessment & Plan: Visit Diagnoses:  1. Right low back pain, unspecified chronicity, unspecified whether sciatica present   2. Lumbar radiculopathy   3. Spondylolisthesis of lumbar region     Plan: Findings:  Severe limiting right radicular leg pain in a classic L5 distribution in the setting of lumbar spine with listhesis of L4 on L5 and suspected stenosis at this level.  Given that there is no red flag complaints and and she is in a severe amount of pain I think a diagnostic L5 transforaminal injection is warranted.  This would be with fluoroscopic guidance.  This is able to be done with the patient on Coumadin.  If she gets temporary relief but it does not last long then I would look at advanced imaging of lumbar spine MRI to confirm stenosis or rule out any other  issues.  If she gets good long-term relief then would suggest and recommend physical therapy short course for core strengthening and strengthening of the lower extremities.  Medication changes really not warranted right at this point.  She will continue with the tramadol and I told her to probably back off on the prednisone while we did the injection.    Meds & Orders:  Meds ordered this encounter  Medications  . betamethasone acetate-betamethasone sodium phosphate (CELESTONE) injection 12 mg     Orders Placed This Encounter  Procedures  . XR C-ARM NO REPORT  . Epidural Steroid injection    Follow-up: Return if symptoms worsen or fail to improve.   Procedures: No procedures performed  Lumbosacral Transforaminal Epidural Steroid Injection - Sub-Pedicular Approach with Fluoroscopic Guidance  Patient: Sylvia [name]      Date of Birth: 11-Dec-1932 MRN: [id number] PCP: Leanna Battles, MD      Visit Date: 06/22/2018   Universal Protocol:    Date/Time: 06/22/2018  Consent Given By: the patient  Position: PRONE  Additional Comments: Vital signs were monitored before and after the procedure. Patient was prepped and draped in the usual sterile fashion. The correct patient, procedure, and site was verified.   Injection Procedure Details:  Procedure Site One Meds Administered:  Meds ordered this encounter  Medications  . betamethasone acetate-betamethasone sodium phosphate (CELESTONE) injection 12 mg    Laterality: Right  Location/Site:  L5-S1  Needle size: 22 G  Needle type: Spinal  Needle Placement: Transforaminal  Findings:    -Comments: Excellent flow of contrast along the nerve and into the epidural space.  Procedure Details: After squaring off the end-plates to get a true AP view, the C-arm was positioned so that an oblique view of the foramen as noted above was visualized. The target area is just inferior to the "nose of the scotty dog" or sub pedicular. The soft tissues overlying this structure were infiltrated with 2-3 ml. of 1% Lidocaine without Epinephrine.  The spinal needle was inserted toward the target using a "trajectory" view along the fluoroscope beam.  Under AP and lateral visualization, the needle was advanced so it did not puncture dura and was located close the 6 O'Clock position of the pedical in AP tracterory. Biplanar projections were used to confirm position. Aspiration was confirmed to be negative for CSF and/or blood. A 1-2 ml.  volume of Isovue-250 was injected and flow of contrast was noted at each level. Radiographs were obtained for documentation purposes.   After attaining the desired flow of contrast documented above, a 0.5 to 1.0 ml test dose of 0.25% Marcaine was injected into each respective transforaminal space.  The patient was observed for 90 seconds post injection.  After no sensory deficits were reported, and normal lower extremity motor function was noted,   the above injectate was administered so that equal amounts of the injectate were placed at each foramen (level) into the transforaminal epidural space.   Additional Comments:  The patient tolerated the procedure well Dressing: 2 x 2 sterile gauze and Band-Aid    Post-procedure details: Patient was observed during the procedure. Post-procedure instructions were reviewed.  Patient left the clinic in stable condition.    Clinical History: AP and lateral lumbar spine completed 05/25/2018  Spondylolisthesis of L4 on L5 of 3 to 4 mm grade 1.  No evidence of spondylolysis.  Moderate facet arthropathy at L3-4 and L4-5 and L5-S1.  No fractures or lesions.  Some disc space narrowing.  She reports that she has never smoked. She has never used smokeless tobacco. No results for input(s): HGBA1C, LABURIC in the last 8760 hours.  Objective:  VS:  HT:    WT:   BMI:     BP:(!) 171/82  HR:60bpm  TEMP: ( )  RESP:  Physical Exam Vitals signs and nursing note reviewed.  Constitutional:      General: She is not in acute distress.    Appearance: Normal appearance. She is well-developed.  HENT:     Head: Normocephalic and atraumatic.     Nose: Nose normal.     Mouth/Throat:     Mouth: Mucous membranes are moist.     Pharynx: Oropharynx is clear.  Eyes:     Conjunctiva/sclera: Conjunctivae normal.     Pupils: Pupils are equal, round, and reactive to light.  Neck:     Musculoskeletal: Neck supple.  Cardiovascular:     Rate and Rhythm: Regular rhythm.   Pulmonary:     Effort: Pulmonary effort is normal. No respiratory distress.  Abdominal:     General: There is no distension.     Palpations: Abdomen is soft.     Tenderness: There is no guarding.  Musculoskeletal:     Right lower leg: No edema.     Left lower leg: No edema.     Comments: Patient ambulates with a forward flexed lumbar spine has difficulty going from sit to stand in full extension and pain with facet loading.  She has no pain over the greater trochanters or with hip rotation.  She has good distal strength without clonus.  She has negative slump test bilaterally.  Skin:    General: Skin is warm and dry.     Findings: No erythema or rash.  Neurological:     General: No focal deficit present.     Mental Status: She is alert and oriented to person, place, and time.     Motor: No abnormal muscle tone.     Coordination: Coordination normal.     Gait: Gait normal.  Psychiatric:        Mood and Affect: Mood normal.        Behavior: Behavior normal.        Thought Content: Thought content normal.     Ortho Exam Imaging: No results found.  Past Medical/Family/Surgical/Social History: Medications & Allergies reviewed per EMR, new medications updated. Patient Active Problem List   Diagnosis Date Noted  . Obstructive sleep apnea treated with continuous positive airway pressure (CPAP) 06/02/2018  . Unilateral primary osteoarthritis, right knee 03/02/2017  . Unilateral primary osteoarthritis, right knee 03/02/2017  . Encounter for therapeutic drug monitoring 05/24/2013  . Long term (current) use of anticoagulants 07/26/2010  . FATIGUE 05/23/2010  . HYPERCHOLESTEROLEMIA, PURE 02/13/2008  . HYPERTENSION, BENIGN 02/13/2008  . SVT/ PSVT/ PAT 02/13/2008  . ATRIAL FIBRILLATION 02/13/2008  . GERD 02/13/2008  . PALPITATIONS 02/13/2008   Past Medical History:  Diagnosis Date  . Atrial fibrillation ([place])    ATRIAL FIBRILLATION S/P  PVI 6/11  . CAD (coronary artery disease)     a. 03/2002: cath showing 20% OM1 stenosis, EF at 65%, no WMA. No significant CAD.   Marland Kitchen GERD (gastroesophageal reflux disease)   . Hyperlipidemia   . Hypertension   . Macular degeneration   . SVT (supraventricular tachycardia) ([facility])    Family History  Problem Relation Age of Onset  . Stroke Other   . Coronary artery disease Other    Past  Surgical History:  Procedure Laterality Date  . ABLATION OF DYSRHYTHMIC FOCUS  09/2009   s/p PVI by JA  . TOTAL ABDOMINAL HYSTERECTOMY     Social History   Occupational History  . Not on file  Tobacco Use  . Smoking status: Never Smoker  . Smokeless tobacco: Never Used  Substance and Sexual Activity  . Alcohol use: No  . Drug use: No  . Sexual activity: Not on file

## 2018-06-30 NOTE — Telephone Encounter (Signed)
I called pt and he has not received call as yet.  I called and spoke to [facility],  She will have Tamera Reason RT to call them.  I will check later on today.

## 2018-07-01 NOTE — Telephone Encounter (Signed)
I called and spoke to taylor w/ [employer].  She will send message, as she did not find note that RT called them.  I called husband and he stated he did speak to [facility] RT yesterday, which she told him that she did not receive messages.   She would send mask (was on back order) which pt did not know.  Mask to arrive on Friday, so hopefully it does.  I encouraged to give them another trial and hopefully things will settle.  He said he would be patient.

## 2018-07-06 DIAGNOSIS — H353123 Nonexudative age-related macular degeneration, left eye, advanced atrophic without subfoveal involvement: Secondary | ICD-10-CM | POA: Diagnosis not present

## 2018-07-06 DIAGNOSIS — H353212 Exudative age-related macular degeneration, right eye, with inactive choroidal neovascularization: Secondary | ICD-10-CM | POA: Diagnosis not present

## 2018-07-06 DIAGNOSIS — H353221 Exudative age-related macular degeneration, left eye, with active choroidal neovascularization: Secondary | ICD-10-CM | POA: Diagnosis not present

## 2018-07-06 NOTE — Telephone Encounter (Signed)
Spoke to husband.  They did receive mask for her cpap Saturday or Monday, he states.

## 2018-07-12 ENCOUNTER — Telehealth (INDEPENDENT_AMBULATORY_CARE_PROVIDER_SITE_OTHER): Payer: Self-pay | Admitting: Physical Medicine and Rehabilitation

## 2018-07-13 ENCOUNTER — Ambulatory Visit (INDEPENDENT_AMBULATORY_CARE_PROVIDER_SITE_OTHER): Payer: Medicare Other | Admitting: Pharmacist

## 2018-07-13 ENCOUNTER — Other Ambulatory Visit: Payer: Self-pay

## 2018-07-13 DIAGNOSIS — I4891 Unspecified atrial fibrillation: Secondary | ICD-10-CM | POA: Diagnosis not present

## 2018-07-13 DIAGNOSIS — Z5181 Encounter for therapeutic drug level monitoring: Secondary | ICD-10-CM | POA: Diagnosis not present

## 2018-07-13 LAB — POCT INR: INR: 3.6 — AB (ref 2.0–3.0)

## 2018-07-13 NOTE — Telephone Encounter (Signed)
States that shot really helped with most of her pain. She states that most of the leg pain is better, but she is now having pain in right knee- on the top of the knee and posterior.

## 2018-07-13 NOTE — Telephone Encounter (Signed)
Called patient to advise. She would like to try Voltaren gel. [facility] [address].

## 2018-07-13 NOTE — Telephone Encounter (Signed)
Need to know if helped etc, could do phone or video OV

## 2018-07-13 NOTE — Telephone Encounter (Signed)
I can send in Voltaren gel for her knee and she can do ice 20 min on/off and give it a month and at that time if knee is still hurting we can refer to [place]. Let me know, send message about Gel. Tell her it is a topical anti-inflammatory to be used TID.

## 2018-07-14 ENCOUNTER — Other Ambulatory Visit (INDEPENDENT_AMBULATORY_CARE_PROVIDER_SITE_OTHER): Payer: Self-pay | Admitting: Physical Medicine and Rehabilitation

## 2018-07-14 DIAGNOSIS — M1711 Unilateral primary osteoarthritis, right knee: Secondary | ICD-10-CM

## 2018-07-14 MED ORDER — DICLOFENAC SODIUM 1 % TD GEL: 4.0000 g | Freq: Four times a day (QID) | TRANSDERMAL | 3 refills | Status: DC

## 2018-07-14 NOTE — Progress Notes (Signed)
Patient did well with prior epidural but has continued right knee pain with known OA. Rx. voltaren gel. May refer to Dr. Eunice Blase.

## 2018-07-14 NOTE — Telephone Encounter (Signed)
done

## 2018-07-14 NOTE — Telephone Encounter (Signed)
Called patient to advise  °

## 2018-07-26 ENCOUNTER — Telehealth: Payer: Self-pay | Admitting: Pharmacist

## 2018-07-26 ENCOUNTER — Telehealth: Payer: Self-pay

## 2018-07-26 NOTE — Telephone Encounter (Signed)
lmom for prescreen and drive thru 

## 2018-07-26 NOTE — Telephone Encounter (Signed)

## 2018-07-27 ENCOUNTER — Ambulatory Visit (INDEPENDENT_AMBULATORY_CARE_PROVIDER_SITE_OTHER): Payer: Medicare Other

## 2018-07-27 ENCOUNTER — Other Ambulatory Visit: Payer: Self-pay

## 2018-07-27 DIAGNOSIS — Z5181 Encounter for therapeutic drug level monitoring: Secondary | ICD-10-CM

## 2018-07-27 DIAGNOSIS — I4821 Permanent atrial fibrillation: Secondary | ICD-10-CM

## 2018-07-27 DIAGNOSIS — I4891 Unspecified atrial fibrillation: Secondary | ICD-10-CM | POA: Diagnosis not present

## 2018-07-27 LAB — POCT INR: INR: 2.8 (ref 2.0–3.0)

## 2018-07-27 NOTE — Patient Instructions (Signed)
Description   Called spoke with pt, advised to continue on same dosage 1 tablet daily except 1/2 tablet on Wednesdays and Sundays. Recheck in 3 weeks. Call us with any medication changes # [phone] [facility].

## 2018-08-10 DIAGNOSIS — H353221 Exudative age-related macular degeneration, left eye, with active choroidal neovascularization: Secondary | ICD-10-CM | POA: Diagnosis not present

## 2018-08-10 DIAGNOSIS — H353123 Nonexudative age-related macular degeneration, left eye, advanced atrophic without subfoveal involvement: Secondary | ICD-10-CM | POA: Diagnosis not present

## 2018-08-10 DIAGNOSIS — H43812 Vitreous degeneration, left eye: Secondary | ICD-10-CM | POA: Diagnosis not present

## 2018-08-16 ENCOUNTER — Telehealth: Payer: Self-pay

## 2018-08-16 NOTE — Telephone Encounter (Signed)

## 2018-08-17 ENCOUNTER — Ambulatory Visit (INDEPENDENT_AMBULATORY_CARE_PROVIDER_SITE_OTHER): Payer: Medicare Other | Admitting: *Deleted

## 2018-08-17 DIAGNOSIS — I4891 Unspecified atrial fibrillation: Secondary | ICD-10-CM

## 2018-08-17 DIAGNOSIS — Z5181 Encounter for therapeutic drug level monitoring: Secondary | ICD-10-CM | POA: Diagnosis not present

## 2018-08-17 LAB — POCT INR: INR: 2.5 (ref 2.0–3.0)

## 2018-08-17 NOTE — Patient Instructions (Signed)
Called spoke with pt, advised to continue on same dosage 1 tablet daily except 1/2 tablet on Wednesdays and Sundays. Recheck in 4 weeks. Call us with any medication changes # [phone] [facility].

## 2018-08-18 ENCOUNTER — Other Ambulatory Visit: Payer: Self-pay

## 2018-08-19 ENCOUNTER — Ambulatory Visit: Payer: Self-pay

## 2018-08-19 ENCOUNTER — Ambulatory Visit (INDEPENDENT_AMBULATORY_CARE_PROVIDER_SITE_OTHER): Payer: Medicare Other

## 2018-08-19 ENCOUNTER — Ambulatory Visit (INDEPENDENT_AMBULATORY_CARE_PROVIDER_SITE_OTHER): Payer: Medicare Other | Admitting: Orthopaedic Surgery

## 2018-08-19 ENCOUNTER — Encounter: Payer: Self-pay | Admitting: Orthopaedic Surgery

## 2018-08-19 ENCOUNTER — Other Ambulatory Visit: Payer: Self-pay

## 2018-08-19 VITALS — BP 157/79 | HR 76 | Resp 18 | Ht 61.5 in | Wt 150.0 lb

## 2018-08-19 DIAGNOSIS — M25562 Pain in left knee: Secondary | ICD-10-CM

## 2018-08-19 DIAGNOSIS — M1712 Unilateral primary osteoarthritis, left knee: Secondary | ICD-10-CM

## 2018-08-19 DIAGNOSIS — M1711 Unilateral primary osteoarthritis, right knee: Secondary | ICD-10-CM | POA: Diagnosis not present

## 2018-08-19 DIAGNOSIS — G8929 Other chronic pain: Secondary | ICD-10-CM

## 2018-08-19 DIAGNOSIS — M25561 Pain in right knee: Secondary | ICD-10-CM | POA: Diagnosis not present

## 2018-08-19 MED ORDER — LIDOCAINE HCL 1 % IJ SOLN
2.0000 mL | INTRAMUSCULAR | Status: AC | PRN
  Administered 2018-08-19: 13:00:00 2 mL

## 2018-08-19 MED ORDER — BUPIVACAINE HCL 0.25 % IJ SOLN
2.0000 mL | INTRAMUSCULAR | Status: AC | PRN
  Administered 2018-08-19: 2 mL via INTRA_ARTICULAR

## 2018-08-19 MED ORDER — METHYLPREDNISOLONE ACETATE 40 MG/ML IJ SUSP
80.0000 mg | INTRAMUSCULAR | Status: AC | PRN
  Administered 2018-08-19: 80 mg via INTRA_ARTICULAR

## 2018-08-19 NOTE — Progress Notes (Signed)
Office Visit Note   Patient: Sylvia [name]           Date of Birth: 14-Sep-1932           MRN: [id number] Visit Date: 08/19/2018              Requested by: Leanna Battles, MD [place], [place] [id number] PCP: Leanna Battles, MD   Assessment & Plan: Visit Diagnoses:  1. Chronic pain of both knees   2. Unilateral primary osteoarthritis, right knee   3. Unilateral primary osteoarthritis, left knee     Plan:  #1:  Corticosteroid injection to the right knee was accomplished through a lateral portal per Dr. Durward Fortes #2: Follow back up with [place] in 2 weeks for injection to the left knee if this injection was beneficial.  Follow-Up Instructions: Return in about 2 weeks (around 09/02/2018) for Left Knee Injection.   Orders:  Orders Placed This Encounter  Procedures  . Large Joint Inj  . XR KNEE 3 VIEW RIGHT  . XR KNEE 3 VIEW LEFT   No orders of the defined types were placed in this encounter.     Procedures: Large Joint Inj: R knee on 08/19/2018 12:46 PM Indications: pain and diagnostic evaluation Details: 25 G 1.5 in needle, anterolateral approach  Arthrogram: No  Medications: 2 mL lidocaine 1 %; 80 mg methylPREDNISolone acetate 40 MG/ML; 2 mL bupivacaine 0.25 % Outcome: tolerated well, no immediate complications Procedure, treatment alternatives, risks and benefits explained, specific risks discussed. Consent was given by the patient. Immediately prior to procedure a time out was called to verify the correct patient, procedure, equipment, support staff and site/side marked as required. Patient was prepped and draped in the usual sterile fashion.       Clinical Data: No additional findings.   Subjective: Chief Complaint  Patient presents with  . Right Knee - Pain  . Left Knee - Pain   HPI  Sylvia [name] is a 85 year old female who presents with bilateral knee pain. The right knee is worse than left. She is not experiencing any swelling, no injury,  not diabetic.  She does have popping noises, difficulty walking, difficulty sleeping at night. This pain has been for 2 years worsening. She had left knee arthroscopic surgery performed by Dr. Durward Fortes over 5 years ago. She stated her knees are giving way almost causing her to fall. She takes Tylenol for the pain which does not help.    Review of Systems  Constitutional: Positive for fatigue.  HENT: Negative for trouble swallowing.   Eyes: Negative for pain.  Respiratory: Positive for shortness of breath.   Gastrointestinal: Negative for constipation.  Endocrine: Positive for heat intolerance.  Genitourinary: Negative for difficulty urinating.  Musculoskeletal: Positive for gait problem. Negative for joint swelling.  Skin: Negative for rash.  Allergic/Immunologic: Negative for food allergies.  Neurological: Positive for weakness.  Hematological: Does not bruise/bleed easily.  Psychiatric/Behavioral: Positive for sleep disturbance.     Objective: Vital Signs: BP (!) 157/79 (BP Location: Left Arm, Patient Position: Sitting, Cuff Size: Normal)   Pulse 76   Resp 18   Ht 5' 1.5" (1.562 m)   Wt 150 lb (68 kg)   BMI 27.88 kg/m   Physical Exam Constitutional:      Appearance: Normal appearance. She is well-developed.  HENT:     Head: Normocephalic.  Eyes:     Pupils: Pupils are equal, round, and reactive to light.  Pulmonary:  Effort: Pulmonary effort is normal.  Skin:    General: Skin is warm and dry.  Neurological:     Mental Status: She is alert and oriented to person, place, and time.  Psychiatric:        Behavior: Behavior normal.     Ortho Exam  Exam today reveals adiposity about the knees bilaterally.  She has crepitance with range of motion the patellofemoral area.  Does have tenderness to palpation over the joint lines more lateral than medial.  Mild effusion bilaterally.   Specialty Comments:  No specialty comments available.  Imaging: Xr Knee 3 View Left   Result Date: 08/19/2018 Three-view x-ray of the left knee reveals significant degenerative changes on the lateral compartment with joint space narrowing.  Periarticular spurring is noted both on the lateral tibial plateau and lateral femoral condyle.  She does have some mild translation of the femur medially on the tibia.  Peaked intercondylar spines are noted also.  Patellofemoral changes but still maintaining good joint space at that point.  Lateral reveals patellofemoral changes DJD.  Xr Knee 3 View Right  Result Date: 08/19/2018 Three-view x-ray of the right knee reveals medial compartment space narrowing.  Some periarticular spurring both the distal medial femoral condyle and  medial tibial plateau.  Peaked intercondylar spines are noted.  Superior patellar spurring is noted.  Patellofemoral does have some joint space narrowing noted with some sclerosis more on the femoral groove.    PMFS History: Current Outpatient Medications  Medication Sig Dispense Refill  . acetaminophen (TYLENOL) 325 MG tablet Take by mouth every 6 (six) hours as needed for mild pain.    . calcium citrate-vitamin D (CITRACAL+D) 315-200 MG-UNIT per tablet Take 1 tablet by mouth daily.      . fish oil-omega-3 fatty acids 1000 MG capsule Take 1 g by mouth 2 (two) times daily.     . fluocinonide (LIDEX) 0.05 % external solution 2 (two) times daily. 1-2 GTTS IN EARS TWICE WEEKLY AS NEEDED     . hydroxypropyl methylcellulose / hypromellose (ISOPTO TEARS / GONIOVISC) 2.5 % ophthalmic solution Place 1 drop into both eyes as needed for dry eyes.    Marland Kitchen levothyroxine (SYNTHROID, LEVOTHROID) 75 MCG tablet Take 75 mcg by mouth daily.      Marland Kitchen losartan (COZAAR) 100 MG tablet Take 100 mg by mouth daily.      . metoprolol tartrate (LOPRESSOR) 50 MG tablet Take 50 mg by mouth 2 (two) times daily.    . Multiple Vitamins-Minerals (OCUVITE PRESERVISION PO) Take 1 tablet by mouth 2 (two) times daily.    . pantoprazole (PROTONIX) 40 MG tablet  Take 40 mg by mouth 2 (two) times daily.    . polyethylene glycol powder (GLYCOLAX/MIRALAX) powder Take 17 g by mouth daily. PRN      . promethazine (PHENERGAN) 25 MG tablet Take 25 mg by mouth 3 (three) times daily as needed.  0  . raloxifene (EVISTA) 60 MG tablet Take 60 mg by mouth daily.      . sucralfate (CARAFATE) 1 GM/10ML suspension Take 10 mLs (1 g total) by mouth 4 (four) times daily -  with meals and at bedtime. 420 mL 0  . traMADol (ULTRAM) 50 MG tablet Take 50 mg by mouth every 8 (eight) hours as needed.    . warfarin (COUMADIN) 5 MG tablet TAKE 1/2 TO 1 TABLET BY MOUTH ONCE DAILY AS DIRECTED BY COUMADIN CLINIC 90 tablet 0  . diclofenac sodium (VOLTAREN) 1 % GEL  Apply 4 g topically 4 (four) times daily. (Patient not taking: Reported on 08/19/2018) 5 Tube 3  . meloxicam (MOBIC) 15 MG tablet Take 15 mg by mouth daily as needed for pain.    . predniSONE (DELTASONE) 20 MG tablet      No current facility-administered medications for this visit.    x Patient Active Problem List   Diagnosis Date Noted  . Obstructive sleep apnea treated with continuous positive airway pressure (CPAP) 06/02/2018  . Unilateral primary osteoarthritis, right knee 03/02/2017  . Unilateral primary osteoarthritis, right knee 03/02/2017  . Encounter for therapeutic drug monitoring 05/24/2013  . Long term (current) use of anticoagulants 07/26/2010  . FATIGUE 05/23/2010  . HYPERCHOLESTEROLEMIA, PURE 02/13/2008  . HYPERTENSION, BENIGN 02/13/2008  . SVT/ PSVT/ PAT 02/13/2008  . ATRIAL FIBRILLATION 02/13/2008  . GERD 02/13/2008  . PALPITATIONS 02/13/2008   Past Medical History:  Diagnosis Date  . Atrial fibrillation ([facility])    ATRIAL FIBRILLATION S/P  PVI 6/11  . CAD (coronary artery disease)    a. 03/2002: cath showing 20% OM1 stenosis, EF at 65%, no WMA. No significant CAD.   Marland Kitchen GERD (gastroesophageal reflux disease)   . Hyperlipidemia   . Hypertension   . Macular degeneration   . SVT (supraventricular  tachycardia) ([facility])     Family History  Problem Relation Age of Onset  . Stroke Other   . Coronary artery disease Other     Past Surgical History:  Procedure Laterality Date  . ABLATION OF DYSRHYTHMIC FOCUS  09/2009   s/p PVI by JA  . TOTAL ABDOMINAL HYSTERECTOMY     Social History   Occupational History  . Not on file  Tobacco Use  . Smoking status: Never Smoker  . Smokeless tobacco: Never Used  Substance and Sexual Activity  . Alcohol use: No  . Drug use: No  . Sexual activity: Not on file

## 2018-09-07 ENCOUNTER — Other Ambulatory Visit: Payer: Self-pay

## 2018-09-07 ENCOUNTER — Encounter: Payer: Self-pay | Admitting: Orthopaedic Surgery

## 2018-09-07 ENCOUNTER — Ambulatory Visit (INDEPENDENT_AMBULATORY_CARE_PROVIDER_SITE_OTHER): Payer: Medicare Other | Admitting: Orthopaedic Surgery

## 2018-09-07 VITALS — BP 165/71 | HR 78 | Ht 61.5 in | Wt 150.0 lb

## 2018-09-07 DIAGNOSIS — M1712 Unilateral primary osteoarthritis, left knee: Secondary | ICD-10-CM

## 2018-09-07 DIAGNOSIS — M1711 Unilateral primary osteoarthritis, right knee: Secondary | ICD-10-CM

## 2018-09-07 NOTE — Progress Notes (Signed)
Office Visit Note   Patient: Sylvia [name]           Date of Birth: [birth year]           MRN: [id number] Visit Date: 09/07/2018              Requested by: Leanna Battles, MD [place], [place] [id number] PCP: Leanna Battles, MD   Assessment & Plan: Visit Diagnoses:  1. Unilateral primary osteoarthritis, left knee   2. Unilateral primary osteoarthritis, right knee     Plan:  #1: Continue with the Voltaren gel that Dr. Ernestina Patches had prescribed #2: I have instructed her in exercise routine for straight leg lifts. #3: She can return if she needs a cortisone injection. #4: I had a discussion with her about the use of pain medicines especially of narcotics in her age group.  Her physician who is prescribed to Coumadin states that she can use an occasional Advil or Aleve but she do uses that only for severe pain. #5: Return if she would like to have cortisone injections in the future.   Follow-Up Instructions: Return if symptoms worsen or fail to improve.   Orders:  No orders of the defined types were placed in this encounter.  No orders of the defined types were placed in this encounter.     Procedures: No procedures performed   Clinical Data: No additional findings.   Subjective: Chief Complaint  Patient presents with  . Left Knee - Follow-up  . Right Knee - Follow-up   HPI: Patient presents today for a two week follow up on both knees. She had her right knee injected with cortisone on 08/19/18. She said that the injected really helped. She is not wanting an injection in the left knee today because her knee pain is only present with she flexes it. She takes Tylenol if needed.    Review of Systems  Constitutional: Negative for fatigue.  HENT: Negative for ear pain.   Eyes: Negative for pain.  Respiratory: Positive for shortness of breath.   Cardiovascular: Negative for leg swelling.  Gastrointestinal: Negative for constipation and diarrhea.   Endocrine: Negative for cold intolerance and heat intolerance.  Genitourinary: Negative for difficulty urinating.  Musculoskeletal: Negative for joint swelling.  Skin: Negative for rash.  Allergic/Immunologic: Negative for food allergies.  Neurological: Negative for weakness.  Hematological: Does not bruise/bleed easily.  Psychiatric/Behavioral: Negative for sleep disturbance.     Objective: Vital Signs: BP (!) 165/71   Pulse 78   Ht 5' 1.5" (1.562 m)   Wt 150 lb (68 kg)   BMI 27.88 kg/m   Physical Exam Constitutional:      Appearance: She is well-developed.  Eyes:     Pupils: Pupils are equal, round, and reactive to light.  Pulmonary:     Effort: Pulmonary effort is normal.  Skin:    General: Skin is warm and dry.  Neurological:     Mental Status: She is alert and oriented to person, place, and time.  Psychiatric:        Behavior: Behavior normal.     Ortho Exam  Today examination reveals the knees to be cool with no signs of inflammation at this time.  5 degrees bilaterally and flexing to about 90 degrees.  Crepitus with range of motion.  Trace effusion at this time bilaterally.  No warmth or erythema.  Specialty Comments:  No specialty comments available.  Imaging: No results found.   PMFS History: Current Outpatient  Medications  Medication Sig Dispense Refill  . acetaminophen (TYLENOL) 325 MG tablet Take by mouth every 6 (six) hours as needed for mild pain.    . calcium citrate-vitamin D (CITRACAL+D) 315-200 MG-UNIT per tablet Take 1 tablet by mouth daily.      . diclofenac sodium (VOLTAREN) 1 % GEL Apply 4 g topically 4 (four) times daily. 5 Tube 3  . fish oil-omega-3 fatty acids 1000 MG capsule Take 1 g by mouth 2 (two) times daily.     . fluocinonide (LIDEX) 0.05 % external solution 2 (two) times daily. 1-2 GTTS IN EARS TWICE WEEKLY AS NEEDED     . hydroxypropyl methylcellulose / hypromellose (ISOPTO TEARS / GONIOVISC) 2.5 % ophthalmic solution Place 1  drop into both eyes as needed for dry eyes.    Marland Kitchen levothyroxine (SYNTHROID, LEVOTHROID) 75 MCG tablet Take 75 mcg by mouth daily.      Marland Kitchen losartan (COZAAR) 100 MG tablet Take 100 mg by mouth daily.      . meloxicam (MOBIC) 15 MG tablet Take 15 mg by mouth daily as needed for pain.    . metoprolol tartrate (LOPRESSOR) 50 MG tablet Take 50 mg by mouth 2 (two) times daily.    . Multiple Vitamins-Minerals (OCUVITE PRESERVISION PO) Take 1 tablet by mouth 2 (two) times daily.    . pantoprazole (PROTONIX) 40 MG tablet Take 40 mg by mouth 2 (two) times daily.    . polyethylene glycol powder (GLYCOLAX/MIRALAX) powder Take 17 g by mouth daily. PRN      . predniSONE (DELTASONE) 20 MG tablet     . promethazine (PHENERGAN) 25 MG tablet Take 25 mg by mouth 3 (three) times daily as needed.  0  . raloxifene (EVISTA) 60 MG tablet Take 60 mg by mouth daily.      . sucralfate (CARAFATE) 1 GM/10ML suspension Take 10 mLs (1 g total) by mouth 4 (four) times daily -  with meals and at bedtime. 420 mL 0  . traMADol (ULTRAM) 50 MG tablet Take 50 mg by mouth every 8 (eight) hours as needed.    . warfarin (COUMADIN) 5 MG tablet TAKE 1/2 TO 1 TABLET BY MOUTH ONCE DAILY AS DIRECTED BY COUMADIN CLINIC 90 tablet 0   No current facility-administered medications for this visit.     Patient Active Problem List   Diagnosis Date Noted  . Obstructive sleep apnea treated with continuous positive airway pressure (CPAP) 06/02/2018  . Unilateral primary osteoarthritis, right knee 03/02/2017  . Unilateral primary osteoarthritis, right knee 03/02/2017  . Encounter for therapeutic drug monitoring 05/24/2013  . Long term (current) use of anticoagulants 07/26/2010  . FATIGUE 05/23/2010  . HYPERCHOLESTEROLEMIA, PURE 02/13/2008  . HYPERTENSION, BENIGN 02/13/2008  . SVT/ PSVT/ PAT 02/13/2008  . ATRIAL FIBRILLATION 02/13/2008  . GERD 02/13/2008  . PALPITATIONS 02/13/2008   Past Medical History:  Diagnosis Date  . Atrial  fibrillation ([place])    ATRIAL FIBRILLATION S/P  PVI 6/11  . CAD (coronary artery disease)    a. 03/2002: cath showing 20% OM1 stenosis, EF at 65%, no WMA. No significant CAD.   Marland Kitchen GERD (gastroesophageal reflux disease)   . Hyperlipidemia   . Hypertension   . Macular degeneration   . SVT (supraventricular tachycardia) ([facility])     Family History  Problem Relation Age of Onset  . Stroke Other   . Coronary artery disease Other     Past Surgical History:  Procedure Laterality Date  . ABLATION OF DYSRHYTHMIC FOCUS  09/2009   s/p PVI by JA  . TOTAL ABDOMINAL HYSTERECTOMY     Social History   Occupational History  . Not on file  Tobacco Use  . Smoking status: Never Smoker  . Smokeless tobacco: Never Used  Substance and Sexual Activity  . Alcohol use: No  . Drug use: No  . Sexual activity: Not on file

## 2018-09-10 ENCOUNTER — Telehealth: Payer: Self-pay | Admitting: Pharmacist

## 2018-09-10 NOTE — Telephone Encounter (Signed)
1. COVID-19 Pre-Screening Questions:  . In the past 7 to 10 days have you had a cough,  shortness of breath, headache, congestion, fever (100 or greater) body aches, chills, sore throat, or sudden loss of taste or sense of smell? . Have you been around anyone with known Covid 19. . Have you been around anyone who is awaiting Covid 19 test results in the past 7 to 10 days? . Have you been around anyone who has been exposed to Covid 19, or has mentioned symptoms of Covid 19 within the past 7 to 10 days?  NO 2. Pt advised of visitor restrictions (no visitors allowed except if needed to conduct the visit). Also advised to arrive at appointment time and wear a mask.

## 2018-09-14 DIAGNOSIS — H353221 Exudative age-related macular degeneration, left eye, with active choroidal neovascularization: Secondary | ICD-10-CM | POA: Diagnosis not present

## 2018-09-14 DIAGNOSIS — H43812 Vitreous degeneration, left eye: Secondary | ICD-10-CM | POA: Diagnosis not present

## 2018-09-15 ENCOUNTER — Other Ambulatory Visit: Payer: Self-pay

## 2018-09-15 ENCOUNTER — Ambulatory Visit (INDEPENDENT_AMBULATORY_CARE_PROVIDER_SITE_OTHER): Payer: Medicare Other | Admitting: Pharmacist

## 2018-09-15 DIAGNOSIS — I4891 Unspecified atrial fibrillation: Secondary | ICD-10-CM | POA: Diagnosis not present

## 2018-09-15 DIAGNOSIS — Z5181 Encounter for therapeutic drug level monitoring: Secondary | ICD-10-CM | POA: Diagnosis not present

## 2018-09-15 LAB — POCT INR: INR: 2.3 (ref 2.0–3.0)

## 2018-10-05 ENCOUNTER — Other Ambulatory Visit: Payer: Self-pay | Admitting: Cardiology

## 2018-10-07 ENCOUNTER — Telehealth: Payer: Self-pay | Admitting: Physical Medicine and Rehabilitation

## 2018-10-07 NOTE — Telephone Encounter (Signed)
ok 

## 2018-10-08 NOTE — Telephone Encounter (Signed)
Scheduled for 7/14 with driver.

## 2018-10-12 ENCOUNTER — Telehealth: Payer: Self-pay

## 2018-10-12 NOTE — Telephone Encounter (Signed)

## 2018-10-19 ENCOUNTER — Other Ambulatory Visit: Payer: Self-pay

## 2018-10-19 ENCOUNTER — Ambulatory Visit (INDEPENDENT_AMBULATORY_CARE_PROVIDER_SITE_OTHER): Payer: Medicare Other | Admitting: Pharmacist

## 2018-10-19 DIAGNOSIS — H353221 Exudative age-related macular degeneration, left eye, with active choroidal neovascularization: Secondary | ICD-10-CM | POA: Diagnosis not present

## 2018-10-19 DIAGNOSIS — I4891 Unspecified atrial fibrillation: Secondary | ICD-10-CM | POA: Diagnosis not present

## 2018-10-19 DIAGNOSIS — H3570 Unspecified separation of retinal layers: Secondary | ICD-10-CM | POA: Diagnosis not present

## 2018-10-19 DIAGNOSIS — H43812 Vitreous degeneration, left eye: Secondary | ICD-10-CM | POA: Diagnosis not present

## 2018-10-19 DIAGNOSIS — Z5181 Encounter for therapeutic drug level monitoring: Secondary | ICD-10-CM

## 2018-10-19 DIAGNOSIS — H353123 Nonexudative age-related macular degeneration, left eye, advanced atrophic without subfoveal involvement: Secondary | ICD-10-CM | POA: Diagnosis not present

## 2018-10-19 LAB — POCT INR: INR: 3 (ref 2.0–3.0)

## 2018-10-19 NOTE — Patient Instructions (Signed)
Continue taking 1 tablet daily except 1/2 tablet on Wednesdays and Sundays. Recheck in 6 weeks.

## 2018-10-20 DIAGNOSIS — Z1231 Encounter for screening mammogram for malignant neoplasm of breast: Secondary | ICD-10-CM | POA: Diagnosis not present

## 2018-10-26 ENCOUNTER — Ambulatory Visit (INDEPENDENT_AMBULATORY_CARE_PROVIDER_SITE_OTHER): Payer: Medicare Other | Admitting: Physical Medicine and Rehabilitation

## 2018-10-26 ENCOUNTER — Ambulatory Visit: Payer: Self-pay

## 2018-10-26 ENCOUNTER — Encounter: Payer: Self-pay | Admitting: Physical Medicine and Rehabilitation

## 2018-10-26 ENCOUNTER — Other Ambulatory Visit: Payer: Self-pay

## 2018-10-26 VITALS — BP 185/72 | HR 66

## 2018-10-26 DIAGNOSIS — M5416 Radiculopathy, lumbar region: Secondary | ICD-10-CM

## 2018-10-26 MED ORDER — BETAMETHASONE SOD PHOS & ACET 6 (3-3) MG/ML IJ SUSP
12.0000 mg | Freq: Once | INTRAMUSCULAR | Status: AC
  Administered 2018-10-26: 14:00:00 12 mg

## 2018-10-26 NOTE — Progress Notes (Signed)
 .  Numeric Pain Rating Scale and Functional Assessment Average Pain 8   In the last MONTH (on 0-10 scale) has pain interfered with the following?  1. General activity like being  able to carry out your everyday physical activities such as walking, climbing stairs, carrying groceries, or moving a chair?  Rating(7)   +Driver, +BT(warfarin, ok for injection), -Dye Allergies.

## 2018-10-28 NOTE — Procedures (Signed)
Lumbosacral Transforaminal Epidural Steroid Injection - Sub-Pedicular Approach with Fluoroscopic Guidance  Patient: Sylvia [name]      Date of Birth: 05-Oct-1932 MRN: [id number] PCP: Leanna Battles, MD      Visit Date: 10/26/2018   Universal Protocol:    Date/Time: 10/26/2018  Consent Given By: the patient  Position: PRONE  Additional Comments: Vital signs were monitored before and after the procedure. Patient was prepped and draped in the usual sterile fashion. The correct patient, procedure, and site was verified.   Injection Procedure Details:  Procedure Site One Meds Administered:  Meds ordered this encounter  Medications  . betamethasone acetate-betamethasone sodium phosphate (CELESTONE) injection 12 mg    Laterality: Right  Location/Site:  L5-S1  Needle size: 22 G  Needle type: Spinal  Needle Placement: Transforaminal  Findings:    -Comments: Excellent flow of contrast along the nerve and into the epidural space.  Procedure Details: After squaring off the end-plates to get a true AP view, the C-arm was positioned so that an oblique view of the foramen as noted above was visualized. The target area is just inferior to the "nose of the scotty dog" or sub pedicular. The soft tissues overlying this structure were infiltrated with 2-3 ml. of 1% Lidocaine without Epinephrine.  The spinal needle was inserted toward the target using a "trajectory" view along the fluoroscope beam.  Under AP and lateral visualization, the needle was advanced so it did not puncture dura and was located close the 6 O'Clock position of the pedical in AP tracterory. Biplanar projections were used to confirm position. Aspiration was confirmed to be negative for CSF and/or blood. A 1-2 ml. volume of Isovue-250 was injected and flow of contrast was noted at each level. Radiographs were obtained for documentation purposes.   After attaining the desired flow of contrast documented above, a 0.5  to 1.0 ml test dose of 0.25% Marcaine was injected into each respective transforaminal space.  The patient was observed for 90 seconds post injection.  After no sensory deficits were reported, and normal lower extremity motor function was noted,   the above injectate was administered so that equal amounts of the injectate were placed at each foramen (level) into the transforaminal epidural space.   Additional Comments:  The patient tolerated the procedure well Dressing: 2 x 2 sterile gauze and Band-Aid    Post-procedure details: Patient was observed during the procedure. Post-procedure instructions were reviewed.  Patient left the clinic in stable condition.

## 2018-10-28 NOTE — Progress Notes (Signed)
Sylvia [name] - 85 y.o. female MRN [id number]  Date of birth: [birth year]  Office Visit Note: Visit Date: 10/26/2018 PCP: Leanna Battles, MD Referred by: Leanna Battles, MD  Subjective: Chief Complaint  Patient presents with  . Right Leg - Pain   HPI:  Sylvia [name] is a 85 y.o. female who comes in today For planned repeat right L5 transforaminal epidural steroid injection.  Patient is having right radicular leg pain a pretty classic L5 distribution.  She says this is been ongoing for a few weeks but the injection in March actually gave her a tremendous amount of relief.  Injection was performed June 22, 2018.  Because she has done extremely well and there is no red flag symptoms other than the radicular pain I think it is wise to go ahead and repeat the injection.  If the next injection only last a few months or a few weeks then I think it would be prudent at that time to obtain MRI of the lumbar spine.  ROS Otherwise per HPI.  Assessment & Plan: Visit Diagnoses:  1. Lumbar radiculopathy     Plan: No additional findings.   Meds & Orders:  Meds ordered this encounter  Medications  . betamethasone acetate-betamethasone sodium phosphate (CELESTONE) injection 12 mg    Orders Placed This Encounter  Procedures  . XR C-ARM NO REPORT  . Epidural Steroid injection    Follow-up: No follow-ups on file.   Procedures: No procedures performed  Lumbosacral Transforaminal Epidural Steroid Injection - Sub-Pedicular Approach with Fluoroscopic Guidance  Patient: Sylvia [name]      Date of Birth: Apr 25, 1932 MRN: [id number] PCP: Leanna Battles, MD      Visit Date: 10/26/2018   Universal Protocol:    Date/Time: 10/26/2018  Consent Given By: the patient  Position: PRONE  Additional Comments: Vital signs were monitored before and after the procedure. Patient was prepped and draped in the usual sterile fashion. The correct patient, procedure, and site was verified.    Injection Procedure Details:  Procedure Site One Meds Administered:  Meds ordered this encounter  Medications  . betamethasone acetate-betamethasone sodium phosphate (CELESTONE) injection 12 mg    Laterality: Right  Location/Site:  L5-S1  Needle size: 22 G  Needle type: Spinal  Needle Placement: Transforaminal  Findings:    -Comments: Excellent flow of contrast along the nerve and into the epidural space.  Procedure Details: After squaring off the end-plates to get a true AP view, the C-arm was positioned so that an oblique view of the foramen as noted above was visualized. The target area is just inferior to the "nose of the scotty dog" or sub pedicular. The soft tissues overlying this structure were infiltrated with 2-3 ml. of 1% Lidocaine without Epinephrine.  The spinal needle was inserted toward the target using a "trajectory" view along the fluoroscope beam.  Under AP and lateral visualization, the needle was advanced so it did not puncture dura and was located close the 6 O'Clock position of the pedical in AP tracterory. Biplanar projections were used to confirm position. Aspiration was confirmed to be negative for CSF and/or blood. A 1-2 ml. volume of Isovue-250 was injected and flow of contrast was noted at each level. Radiographs were obtained for documentation purposes.   After attaining the desired flow of contrast documented above, a 0.5 to 1.0 ml test dose of 0.25% Marcaine was injected into each respective transforaminal space.  The patient was observed for 90 seconds post injection.  After no sensory deficits were reported, and normal lower extremity motor function was noted,   the above injectate was administered so that equal amounts of the injectate were placed at each foramen (level) into the transforaminal epidural space.   Additional Comments:  The patient tolerated the procedure well Dressing: 2 x 2 sterile gauze and Band-Aid    Post-procedure details:  Patient was observed during the procedure. Post-procedure instructions were reviewed.  Patient left the clinic in stable condition.     Clinical History: AP and lateral lumbar spine completed 05/25/2018  Spondylolisthesis of L4 on L5 of 3 to 4 mm grade 1.  No evidence of spondylolysis.  Moderate facet arthropathy at L3-4 and L4-5 and L5-S1.  No fractures or lesions.  Some disc space narrowing.     Objective:  VS:  HT:    WT:   BMI:     BP:(!) 185/72  HR:66bpm  TEMP: ( )  RESP:  Physical Exam  Ortho Exam Imaging: No results found.

## 2018-11-05 DIAGNOSIS — E7849 Other hyperlipidemia: Secondary | ICD-10-CM | POA: Diagnosis not present

## 2018-11-05 DIAGNOSIS — E038 Other specified hypothyroidism: Secondary | ICD-10-CM | POA: Diagnosis not present

## 2018-11-05 DIAGNOSIS — M859 Disorder of bone density and structure, unspecified: Secondary | ICD-10-CM | POA: Diagnosis not present

## 2018-11-09 DIAGNOSIS — R82998 Other abnormal findings in urine: Secondary | ICD-10-CM | POA: Diagnosis not present

## 2018-11-09 DIAGNOSIS — I10 Essential (primary) hypertension: Secondary | ICD-10-CM | POA: Diagnosis not present

## 2018-11-12 DIAGNOSIS — M179 Osteoarthritis of knee, unspecified: Secondary | ICD-10-CM | POA: Diagnosis not present

## 2018-11-12 DIAGNOSIS — M859 Disorder of bone density and structure, unspecified: Secondary | ICD-10-CM | POA: Diagnosis not present

## 2018-11-12 DIAGNOSIS — I129 Hypertensive chronic kidney disease with stage 1 through stage 4 chronic kidney disease, or unspecified chronic kidney disease: Secondary | ICD-10-CM | POA: Diagnosis not present

## 2018-11-12 DIAGNOSIS — K8020 Calculus of gallbladder without cholecystitis without obstruction: Secondary | ICD-10-CM | POA: Diagnosis not present

## 2018-11-12 DIAGNOSIS — I482 Chronic atrial fibrillation, unspecified: Secondary | ICD-10-CM | POA: Diagnosis not present

## 2018-11-12 DIAGNOSIS — E038 Other specified hypothyroidism: Secondary | ICD-10-CM | POA: Diagnosis not present

## 2018-11-12 DIAGNOSIS — Z7901 Long term (current) use of anticoagulants: Secondary | ICD-10-CM | POA: Diagnosis not present

## 2018-11-12 DIAGNOSIS — H3530 Unspecified macular degeneration: Secondary | ICD-10-CM | POA: Diagnosis not present

## 2018-11-12 DIAGNOSIS — Z0000 Encounter for general adult medical examination without abnormal findings: Secondary | ICD-10-CM | POA: Diagnosis not present

## 2018-11-12 DIAGNOSIS — E7849 Other hyperlipidemia: Secondary | ICD-10-CM | POA: Diagnosis not present

## 2018-11-12 DIAGNOSIS — G4733 Obstructive sleep apnea (adult) (pediatric): Secondary | ICD-10-CM | POA: Diagnosis not present

## 2018-11-12 DIAGNOSIS — M545 Low back pain: Secondary | ICD-10-CM | POA: Diagnosis not present

## 2018-11-23 DIAGNOSIS — H353221 Exudative age-related macular degeneration, left eye, with active choroidal neovascularization: Secondary | ICD-10-CM | POA: Diagnosis not present

## 2018-11-23 DIAGNOSIS — H353123 Nonexudative age-related macular degeneration, left eye, advanced atrophic without subfoveal involvement: Secondary | ICD-10-CM | POA: Diagnosis not present

## 2018-11-30 ENCOUNTER — Other Ambulatory Visit: Payer: Self-pay

## 2018-11-30 ENCOUNTER — Ambulatory Visit (INDEPENDENT_AMBULATORY_CARE_PROVIDER_SITE_OTHER): Payer: Medicare Other | Admitting: *Deleted

## 2018-11-30 DIAGNOSIS — I4891 Unspecified atrial fibrillation: Secondary | ICD-10-CM | POA: Diagnosis not present

## 2018-11-30 DIAGNOSIS — Z5181 Encounter for therapeutic drug level monitoring: Secondary | ICD-10-CM

## 2018-11-30 LAB — POCT INR: INR: 2.5 (ref 2.0–3.0)

## 2018-11-30 NOTE — Patient Instructions (Signed)
Description   Continue taking 1 tablet daily except 1/2 tablet on Sunday and Wednesday. Recheck in 6 weeks.  Call us with any medication changes # [phone] [facility].

## 2018-12-13 ENCOUNTER — Encounter: Payer: Self-pay | Admitting: Adult Health

## 2018-12-13 ENCOUNTER — Other Ambulatory Visit: Payer: Self-pay

## 2018-12-13 ENCOUNTER — Ambulatory Visit (INDEPENDENT_AMBULATORY_CARE_PROVIDER_SITE_OTHER): Payer: Medicare Other | Admitting: Adult Health

## 2018-12-13 VITALS — BP 157/79 | HR 70 | Temp 97.1°F | Ht 61.5 in | Wt 154.0 lb

## 2018-12-13 DIAGNOSIS — N183 Chronic kidney disease, stage 3 (moderate): Secondary | ICD-10-CM | POA: Diagnosis not present

## 2018-12-13 DIAGNOSIS — G4733 Obstructive sleep apnea (adult) (pediatric): Secondary | ICD-10-CM | POA: Diagnosis not present

## 2018-12-13 DIAGNOSIS — I482 Chronic atrial fibrillation, unspecified: Secondary | ICD-10-CM | POA: Diagnosis not present

## 2018-12-13 DIAGNOSIS — Z9989 Dependence on other enabling machines and devices: Secondary | ICD-10-CM

## 2018-12-13 DIAGNOSIS — M5010 Cervical disc disorder with radiculopathy, unspecified cervical region: Secondary | ICD-10-CM | POA: Diagnosis not present

## 2018-12-13 DIAGNOSIS — Z7901 Long term (current) use of anticoagulants: Secondary | ICD-10-CM | POA: Diagnosis not present

## 2018-12-13 DIAGNOSIS — I129 Hypertensive chronic kidney disease with stage 1 through stage 4 chronic kidney disease, or unspecified chronic kidney disease: Secondary | ICD-10-CM | POA: Diagnosis not present

## 2018-12-13 NOTE — Progress Notes (Signed)
PATIENT: Sylvia [name] DOB: 03-28-1933  REASON FOR VISIT: follow up HISTORY FROM: patient  HISTORY OF PRESENT ILLNESS: Today 12/13/18:  Sylvia [name] is an 85 year old female with a history of obstructive sleep apnea on CPAP.  She returns today for follow-up.  Her CPAP download indicates that she use the machine nightly for compliance of 100%.  She used the machine greater than 4 hours 26 out of 30 days for compliance of 87%.  On average she uses the machine 5 hours and 54 minutes.  Her residual AHI is 9.4 on 8 cm of water with EPR 3.  Her leak in the 95th percentile is 32.7 L/min.  She reports that she does feel the mask leaking at night.  She states that some nights she will wake up at 3 AM and unable to go back to sleep.  She reports that there are other times that she feels that the CPAP is beneficial.  She returns today for an evaluation.    REVIEW OF SYSTEMS: Out of a complete 14 system review of symptoms, the patient complains only of the following symptoms, and all other reviewed systems are negative.  ALLERGIES: Allergies  Allergen Reactions  . Metoclopramide Hcl     REACTION: nervous, climbs the wall  . Penicillins     REACTION: hives  . Pravastatin   . Sulfonamide Derivatives     REACTION: rash    HOME MEDICATIONS: Outpatient Medications Prior to Visit  Medication Sig Dispense Refill  . acetaminophen (TYLENOL) 325 MG tablet Take by mouth every 6 (six) hours as needed for mild pain.    . calcium citrate-vitamin D (CITRACAL+D) 315-200 MG-UNIT per tablet Take 1 tablet by mouth daily.      . diclofenac sodium (VOLTAREN) 1 % GEL Apply 4 g topically 4 (four) times daily. 5 Tube 3  . fish oil-omega-3 fatty acids 1000 MG capsule Take 1 g by mouth 2 (two) times daily.     . fluocinonide (LIDEX) 0.05 % external solution 2 (two) times daily. 1-2 GTTS IN EARS TWICE WEEKLY AS NEEDED     . hydroxypropyl methylcellulose / hypromellose (ISOPTO TEARS / GONIOVISC) 2.5 % ophthalmic  solution Place 1 drop into both eyes as needed for dry eyes.    Marland Kitchen levothyroxine (SYNTHROID, LEVOTHROID) 75 MCG tablet Take 75 mcg by mouth daily.      Marland Kitchen losartan (COZAAR) 100 MG tablet Take 100 mg by mouth daily.      . meloxicam (MOBIC) 15 MG tablet Take 15 mg by mouth daily as needed for pain.    . metoprolol tartrate (LOPRESSOR) 50 MG tablet Take 50 mg by mouth 2 (two) times daily.    . Multiple Vitamins-Minerals (OCUVITE PRESERVISION PO) Take 1 tablet by mouth 2 (two) times daily.    . pantoprazole (PROTONIX) 40 MG tablet Take 40 mg by mouth 2 (two) times daily.    . polyethylene glycol powder (GLYCOLAX/MIRALAX) powder Take 17 g by mouth daily. PRN      . predniSONE (DELTASONE) 20 MG tablet     . promethazine (PHENERGAN) 25 MG tablet Take 25 mg by mouth 3 (three) times daily as needed.  0  . raloxifene (EVISTA) 60 MG tablet Take 60 mg by mouth daily.      . sucralfate (CARAFATE) 1 GM/10ML suspension Take 10 mLs (1 g total) by mouth 4 (four) times daily -  with meals and at bedtime. 420 mL 0  . traMADol (ULTRAM) 50 MG tablet Take  50 mg by mouth every 8 (eight) hours as needed.    . warfarin (COUMADIN) 5 MG tablet TAKE 1/2 TO 1 (ONE-HALF TO ONE) TABLET BY MOUTH ONCE DAILY AS DIRECTED BY  [facility]  CLIN[facility] 90 tablet 0   No facility-administered medications prior to visit.     PAST MEDICAL HISTORY: Past Medical History:  Diagnosis Date  . Atrial fibrillation ([place])    ATRIAL FIBRILLATION S/P  PVI 6/11  . CAD (coronary artery disease)    a. 03/2002: cath showing 20% OM1 stenosis, EF at 65%, no WMA. No significant CAD.   Marland Kitchen GERD (gastroesophageal reflux disease)   . Hyperlipidemia   . Hypertension   . Macular degeneration   . SVT (supraventricular tachycardia) ([place])     PAST SURGICAL HISTORY: Past Surgical History:  Procedure Laterality Date  . ABLATION OF DYSRHYTHMIC FOCUS  09/2009   s/p PVI by JA  . TOTAL ABDOMINAL HYSTERECTOMY      FAMILY HISTORY: Family History  Problem  Relation Age of Onset  . Stroke Other   . Coronary artery disease Other     SOCIAL HISTORY: Social History   Socioeconomic History  . Marital status: Married    Spouse name: Not on file  . Number of children: Not on file  . Years of education: Not on file  . Highest education level: Not on file  Occupational History  . Not on file  Social Needs  . Financial resource strain: Not on file  . Food insecurity    Worry: Not on file    Inability: Not on file  . Transportation needs    Medical: Not on file    Non-medical: Not on file  Tobacco Use  . Smoking status: Never Smoker  . Smokeless tobacco: Never Used  Substance and Sexual Activity  . Alcohol use: No  . Drug use: No  . Sexual activity: Not on file  Lifestyle  . Physical activity    Days per week: Not on file    Minutes per session: Not on file  . Stress: Not on file  Relationships  . Social Herbalist on phone: Not on file    Gets together: Not on file    Attends religious service: Not on file    Active member of club or organization: Not on file    Attends meetings of clubs or organizations: Not on file    Relationship status: Not on file  . Intimate partner violence    Fear of current or ex partner: Not on file    Emotionally abused: Not on file    Physically abused: Not on file    Forced sexual activity: Not on file  Other Topics Concern  . Not on file  Social History Narrative   Lives with spouse      PHYSICAL EXAM  Vitals:   12/13/18 1441  BP: (!) 157/79  Pulse: 70  Temp: (!) 97.1 F (36.2 C)  TempSrc: Oral  Weight: 154 lb (69.9 kg)  Height: 5' 1.5" (1.562 m)   Body mass index is 28.63 kg/m.  Generalized: Well developed, in no acute distress  Chest: Lungs clear to auscultation bilaterally Neurological examination  Mentation: Alert oriented to time, place, history taking. Follows all commands speech and language fluent Cranial nerve II-XII: Extraocular movements were full,  visual field were full on confrontational test.  Head turning and shoulder shrug  were normal and symmetric. Motor: The motor testing reveals 5 over 5 strength  of all 4 extremities. Good symmetric motor tone is noted throughout.  Sensory: Sensory testing is intact to soft touch on all 4 extremities. No evidence of extinction is noted.    DIAGNOSTIC DATA (LABS, IMAGING, TESTING) - I reviewed patient records, labs, notes, testing and imaging myself where available.  Lab Results  Component Value Date   WBC 7.6 07/14/2017   HGB 14.9 07/14/2017   HCT 44.6 07/14/2017   MCV 92.7 07/14/2017   PLT 222 07/14/2017      Component Value Date/Time   NA 138 07/14/2017 0955   K 3.1 (L) 07/14/2017 0955   CL 101 07/14/2017 0955   CO2 24 07/14/2017 0955   GLUCOSE 161 (H) 07/14/2017 0955   BUN 23 (H) 07/14/2017 0955   CREATININE 0.97 07/14/2017 0955   CREATININE 0.97 (H) 03/12/2015 1010   CALCIUM 9.7 07/14/2017 0955   PROT 7.4 07/14/2017 0955   ALBUMIN 3.7 07/14/2017 0955   AST 23 07/14/2017 0955   ALT 15 07/14/2017 0955   ALKPHOS 53 07/14/2017 0955   BILITOT 1.2 07/14/2017 0955   GFRNONAA 53 (L) 07/14/2017 0955   GFRNONAA 55 (L) 03/24/2014 1057   GFRAA >60 07/14/2017 0955   GFRAA 63 03/24/2014 1057   No results found for: CHOL, HDL, LDLCALC, LDLDIRECT, TRIG, CHOLHDL No results found for: HGBA1C No results found for: VITAMINB12 Lab Results  Component Value Date   TSH 0.611 03/24/2014      ASSESSMENT AND PLAN 85 y.o. year old female  has a past medical history of Atrial fibrillation ([place]), CAD (coronary artery disease), GERD (gastroesophageal reflux disease), Hyperlipidemia, Hypertension, Macular degeneration, and SVT (supraventricular tachycardia) ([place]). here with:  1.  Obstructive sleep apnea on CPAP  Patient CPAP download shows excellent compliance her residual AHI is slightly elevated.  I will change her to AutoSet with pressure 5 to 15 cm of water.  I also suggested a mask  refitting.  Orders have been sent to her DME company.  She is encouraged to continue using CPAP nightly and greater than 4 hours each night.  She is advised that if her symptoms worsen or she develops new symptoms she should let us know.  She will follow-up in 6 months or sooner if needed.    I spent 15 minutes with the patient. 50% of this time was spent discussing CPAP download   Ward Givens, MSN, NP-C 12/13/2018, 2:33 PM [facility] [address] [place], [place] [address] [phone]

## 2018-12-13 NOTE — Patient Instructions (Signed)
Continue using CPAP nightly and greater than 4 hours each night Order sent to DME to change to autoset 5-15 cm h20, mask refitting If your symptoms worsen or you develop new symptoms please let us know.

## 2018-12-14 NOTE — Progress Notes (Signed)
cpap orders received by [employer]. 12-14-18 sy

## 2018-12-15 ENCOUNTER — Telehealth: Payer: Self-pay | Admitting: Pharmacist

## 2018-12-15 NOTE — Telephone Encounter (Signed)
Pt called clinic to state she started tizanidine and prednisone 20mg  x 5 days. She will bring bottle of tizanidine to her next INR check so we can add it to her medication list. Advised her that prednisone can increase her INR although not as much as a taper would. She started prednisone on Monday and has 2 days left. She does not wish to come in this week to have her INR checked. Advised her to eat extra greens today and tomorrow, will keep f/u appt on 9/29.

## 2018-12-17 ENCOUNTER — Other Ambulatory Visit: Payer: Self-pay | Admitting: Internal Medicine

## 2018-12-17 DIAGNOSIS — M5030 Other cervical disc degeneration, unspecified cervical region: Secondary | ICD-10-CM

## 2018-12-17 DIAGNOSIS — M5412 Radiculopathy, cervical region: Secondary | ICD-10-CM

## 2018-12-25 ENCOUNTER — Other Ambulatory Visit: Payer: Self-pay

## 2018-12-25 ENCOUNTER — Ambulatory Visit
Admission: RE | Admit: 2018-12-25 | Discharge: 2018-12-25 | Disposition: A | Discharge status: Home / Self Care | Payer: Medicare Other | Source: Ambulatory Visit | Attending: Internal Medicine | Admitting: Internal Medicine

## 2018-12-25 DIAGNOSIS — M5030 Other cervical disc degeneration, unspecified cervical region: Secondary | ICD-10-CM

## 2018-12-25 DIAGNOSIS — M5412 Radiculopathy, cervical region: Secondary | ICD-10-CM

## 2018-12-25 DIAGNOSIS — M4802 Spinal stenosis, cervical region: Secondary | ICD-10-CM | POA: Diagnosis not present

## 2018-12-28 DIAGNOSIS — H43812 Vitreous degeneration, left eye: Secondary | ICD-10-CM | POA: Diagnosis not present

## 2018-12-28 DIAGNOSIS — H353123 Nonexudative age-related macular degeneration, left eye, advanced atrophic without subfoveal involvement: Secondary | ICD-10-CM | POA: Diagnosis not present

## 2018-12-28 DIAGNOSIS — H353221 Exudative age-related macular degeneration, left eye, with active choroidal neovascularization: Secondary | ICD-10-CM | POA: Diagnosis not present

## 2019-01-01 ENCOUNTER — Other Ambulatory Visit: Payer: Self-pay | Admitting: Cardiology

## 2019-01-03 DIAGNOSIS — Z1212 Encounter for screening for malignant neoplasm of rectum: Secondary | ICD-10-CM | POA: Diagnosis not present

## 2019-01-04 ENCOUNTER — Telehealth: Payer: Self-pay | Admitting: *Deleted

## 2019-01-11 ENCOUNTER — Other Ambulatory Visit: Payer: Self-pay

## 2019-01-11 ENCOUNTER — Ambulatory Visit (INDEPENDENT_AMBULATORY_CARE_PROVIDER_SITE_OTHER): Payer: Medicare Other | Admitting: *Deleted

## 2019-01-11 DIAGNOSIS — Z5181 Encounter for therapeutic drug level monitoring: Secondary | ICD-10-CM

## 2019-01-11 DIAGNOSIS — I4891 Unspecified atrial fibrillation: Secondary | ICD-10-CM

## 2019-01-11 LAB — POCT INR: INR: 3.3 — AB (ref 2.0–3.0)

## 2019-01-11 NOTE — Patient Instructions (Signed)
Description   Hold today, then continue taking 1 tablet daily except 1/2 tablet on Sunday and Wednesday. Recheck in 3 weeks. Call us with any medication changes # [phone] [facility].

## 2019-01-18 ENCOUNTER — Ambulatory Visit (INDEPENDENT_AMBULATORY_CARE_PROVIDER_SITE_OTHER): Payer: Medicare Other | Admitting: Physical Medicine and Rehabilitation

## 2019-01-18 ENCOUNTER — Encounter: Payer: Self-pay | Admitting: Physical Medicine and Rehabilitation

## 2019-01-18 VITALS — BP 177/84 | HR 66

## 2019-01-18 DIAGNOSIS — M47812 Spondylosis without myelopathy or radiculopathy, cervical region: Secondary | ICD-10-CM | POA: Diagnosis not present

## 2019-01-18 DIAGNOSIS — M545 Low back pain, unspecified: Secondary | ICD-10-CM

## 2019-01-18 DIAGNOSIS — M542 Cervicalgia: Secondary | ICD-10-CM

## 2019-01-18 DIAGNOSIS — M25511 Pain in right shoulder: Secondary | ICD-10-CM

## 2019-01-18 DIAGNOSIS — M4316 Spondylolisthesis, lumbar region: Secondary | ICD-10-CM

## 2019-01-18 DIAGNOSIS — M7918 Myalgia, other site: Secondary | ICD-10-CM

## 2019-01-18 DIAGNOSIS — G8929 Other chronic pain: Secondary | ICD-10-CM | POA: Diagnosis not present

## 2019-01-18 DIAGNOSIS — M4312 Spondylolisthesis, cervical region: Secondary | ICD-10-CM | POA: Diagnosis not present

## 2019-01-18 NOTE — Progress Notes (Signed)
Sylvia [name] - 85 y.o. female MRN [id number]  Date of birth: 1932/05/06  Office Visit Note: Visit Date: 01/18/2019 PCP: Leanna Battles, MD Referred by: Leanna Battles, MD  Subjective: Chief Complaint  Patient presents with  . Neck - Pain  . Right Shoulder - Pain  . Lower Back - Pain   HPI: Sylvia [name] is a 85 y.o. female who comes in today For evaluation management of chronic worsening mostly right-sided neck and shoulder pain.  She interestingly describes pressing on the shoulder is helping with the pain.  Movement of the shoulder does not seem to exacerbate her pain too much.  She denies any real left-sided complaints.  No associated headaches.  No specific trauma.  She has had conservative care and medication management with her primary care physician Dr. Sharlett Iles.  She has had MRI of the cervical spine performed and we did review this with her today.  This shows multilevel spondylitic changes but no specific focal nerve compression or stenosis.  Symptoms have been ongoing now pretty severe for 4 weeks intermittent in nature pain right now is a 3 out of 10 but it can be an 8 out of 10.  Its intermittent and sharp.  Nothing so far is really improved her pain.  She does suffer basically from chronic pain syndrome at this point she has lower back pain as well that we looked at and treated with epidural injection.  Her back pain is still present but doing okay she is not having radicular complaints of the legs.  She has had no focal weakness of the upper arms.  No fevers chills or night sweats.  No specific night pain.  Review of Systems  Constitutional: Negative for chills, fever, malaise/fatigue and weight loss.  HENT: Negative for hearing loss and sinus pain.   Eyes: Negative for blurred vision, double vision and photophobia.  Respiratory: Negative for cough and shortness of breath.   Cardiovascular: Negative for chest pain, palpitations and leg swelling.  Gastrointestinal:  Negative for abdominal pain, nausea and vomiting.  Genitourinary: Negative for flank pain.  Musculoskeletal: Positive for joint pain and neck pain. Negative for myalgias.  Skin: Negative for itching and rash.  Neurological: Negative for tremors, focal weakness and weakness.  Endo/Heme/Allergies: Negative.   Psychiatric/Behavioral: Negative for depression.  All other systems reviewed and are negative.  Otherwise per HPI.  Assessment & Plan: Visit Diagnoses:  1. Cervicalgia   2. Cervical spondylosis without myelopathy   3. Spondylolisthesis of cervical region   4. Chronic right shoulder pain   5. Myofascial pain syndrome   6. Spondylolisthesis of lumbar region   7. Right low back pain, unspecified chronicity, unspecified whether sciatica present     Plan: No additional findings.   Meds & Orders: No orders of the defined types were placed in this encounter.   Orders Placed This Encounter  Procedures  . Trigger Point Inj    Follow-up: No follow-ups on file.   Procedures: Trigger Point Inj  Date/Time: 01/18/2019 9:20 AM Performed by: Magnus Sinning, MD Authorized by: Magnus Sinning, MD   Consent Given by:  Patient Site marked: the procedure site was marked   Timeout: prior to procedure the correct patient, procedure, and site was verified   Total # of Trigger Points:  3 or more Location: neck and back   Needle Size:  25 G Approach:  Dorsal Medications #1:  40 mg triamcinolone acetonide 40 MG/ML Medications #2:  3 mL lidocaine 1 %  Additional Injections?: No   Patient tolerance:  Patient tolerated the procedure well with no immediate complications Comments: Trigger points palpated in the right levator scapula trapezius and supraspinatus.  Needling technique utilized.    No notes on file   Clinical History: MRI CERVICAL SPINE WITHOUT CONTRAST  TECHNIQUE: Multiplanar, multisequence MR imaging of the cervical spine was performed. No intravenous contrast was  administered.  COMPARISON:  None.  FINDINGS: Alignment: 1 or 2 mm anterolisthesis C5-6, C6-7 and C7-T1.  Vertebrae: No fracture or primary bone lesion.  Cord: No cord compression or primary cord lesion.  Posterior Fossa, vertebral arteries, paraspinal tissues: Negative  Disc levels:  No significant finding from the foramen magnum through C4-5.  C5-6: 1 or 2 mm of anterolisthesis. Minimal bulging of the disc. No canal or foraminal stenosis. No edematous facet arthropathy.  C6-7: 1 or 2 mm of anterolisthesis. Mild bulging of the disc. No compressive canal or foraminal narrowing. No edematous facet arthropathy.  C7-T1: 1 or 2 mm of anterolisthesis. Mild bulging of the disc. No compressive canal or foraminal narrowing. No edematous facet arthropathy.  IMPRESSION: Essentially negative study for a person of this age. Mild non-compressive disc bulges at C5-6, C6-7 and C7-T1. One or 2 mm of degenerative anterolisthesis at those levels. The patient does not appear to have edematous facet arthropathy as an explanation.   Electronically Signed   By: Nelson Chimes M.D.   On: 12/26/2018 11:48   She reports that she has never smoked. She has never used smokeless tobacco. No results for input(s): HGBA1C, LABURIC in the last 8760 hours.  Objective:  VS:  HT:    WT:   BMI:     BP:(!) 177/84  HR:66bpm  TEMP: ( )  RESP:  Physical Exam Vitals signs and nursing note reviewed.  Constitutional:      General: She is not in acute distress.    Appearance: Normal appearance. She is well-developed. She is not ill-appearing.  HENT:     Head: Normocephalic and atraumatic.  Eyes:     Conjunctiva/sclera: Conjunctivae normal.     Pupils: Pupils are equal, round, and reactive to light.  Neck:     Musculoskeletal: Neck supple. Muscular tenderness present. No neck rigidity.  Cardiovascular:     Rate and Rhythm: Normal rate.     Pulses: Normal pulses.  Pulmonary:     Effort:  Pulmonary effort is normal.  Musculoskeletal:     Right lower leg: No edema.     Left lower leg: No edema.     Comments: Sits with forward flexed cervical spine.  Trigger points noted in the levator scapula supraspinatus and trapezius.  Some limitation of range of motion and end ranges of rotation.  Negative Spurling's test bilaterally.  Some shoulder impingement bilaterally with external rotation.  Good strength in the upper extremities symmetric.  Lymphadenopathy:     Cervical: No cervical adenopathy.  Skin:    General: Skin is warm and dry.     Findings: No erythema or rash.  Neurological:     General: No focal deficit present.     Mental Status: She is alert and oriented to person, place, and time.     Sensory: No sensory deficit.     Motor: No abnormal muscle tone.     Coordination: Coordination normal.     Gait: Gait normal.  Psychiatric:        Mood and Affect: Mood normal.        Behavior: Behavior  normal.     Ortho Exam Imaging: No results found.  Past Medical/Family/Surgical/Social History: Medications & Allergies reviewed per EMR, new medications updated. Patient Active Problem List   Diagnosis Date Noted  . Cervical spondylosis without myelopathy 01/18/2019  . Spondylolisthesis of cervical region 01/18/2019  . Obstructive sleep apnea treated with continuous positive airway pressure (CPAP) 06/02/2018  . Unilateral primary osteoarthritis, right knee 03/02/2017  . Unilateral primary osteoarthritis, right knee 03/02/2017  . Encounter for therapeutic drug monitoring 05/24/2013  . Long term (current) use of anticoagulants 07/26/2010  . FATIGUE 05/23/2010  . HYPERCHOLESTEROLEMIA, PURE 02/13/2008  . HYPERTENSION, BENIGN 02/13/2008  . SVT/ PSVT/ PAT 02/13/2008  . ATRIAL FIBRILLATION 02/13/2008  . GERD 02/13/2008  . PALPITATIONS 02/13/2008   Past Medical History:  Diagnosis Date  . Atrial fibrillation ([place])    ATRIAL FIBRILLATION S/P  PVI 6/11  . CAD (coronary  artery disease)    a. 03/2002: cath showing 20% OM1 stenosis, EF at 65%, no WMA. No significant CAD.   Marland Kitchen GERD (gastroesophageal reflux disease)   . Hyperlipidemia   . Hypertension   . Macular degeneration   . SVT (supraventricular tachycardia) ([facility])    Family History  Problem Relation Age of Onset  . Stroke Other   . Coronary artery disease Other    Past Surgical History:  Procedure Laterality Date  . ABLATION OF DYSRHYTHMIC FOCUS  09/2009   s/p PVI by JA  . TOTAL ABDOMINAL HYSTERECTOMY     Social History   Occupational History  . Not on file  Tobacco Use  . Smoking status: Never Smoker  . Smokeless tobacco: Never Used  Substance and Sexual Activity  . Alcohol use: No  . Drug use: No  . Sexual activity: Not on file

## 2019-01-18 NOTE — Progress Notes (Signed)
  Numeric Pain Rating Scale and Functional Assessment Average Pain 8 Pain Right Now 3 My pain is intermittent and sharp Pain is worse with: unsure Pain improves with: nothing   In the last MONTH (on 0-10 scale) has pain interfered with the following?  1. General activity like being  able to carry out your everyday physical activities such as walking, climbing stairs, carrying groceries, or moving a chair?  Rating(9)  2. Relation with others like being able to carry out your usual social activities and roles such as  activities at home, at work and in your community. Rating(9)  3. Enjoyment of life such that you have  been bothered by emotional problems such as feeling anxious, depressed or irritable?  Rating(8)

## 2019-01-31 DIAGNOSIS — I482 Chronic atrial fibrillation, unspecified: Secondary | ICD-10-CM | POA: Diagnosis not present

## 2019-01-31 DIAGNOSIS — M5489 Other dorsalgia: Secondary | ICD-10-CM | POA: Diagnosis not present

## 2019-01-31 DIAGNOSIS — I129 Hypertensive chronic kidney disease with stage 1 through stage 4 chronic kidney disease, or unspecified chronic kidney disease: Secondary | ICD-10-CM | POA: Diagnosis not present

## 2019-01-31 DIAGNOSIS — Z7901 Long term (current) use of anticoagulants: Secondary | ICD-10-CM | POA: Diagnosis not present

## 2019-01-31 DIAGNOSIS — N1831 Chronic kidney disease, stage 3a: Secondary | ICD-10-CM | POA: Diagnosis not present

## 2019-01-31 DIAGNOSIS — W19XXXA Unspecified fall, initial encounter: Secondary | ICD-10-CM | POA: Diagnosis not present

## 2019-02-01 ENCOUNTER — Ambulatory Visit (INDEPENDENT_AMBULATORY_CARE_PROVIDER_SITE_OTHER): Payer: Medicare Other | Admitting: *Deleted

## 2019-02-01 ENCOUNTER — Other Ambulatory Visit: Payer: Self-pay

## 2019-02-01 DIAGNOSIS — H353123 Nonexudative age-related macular degeneration, left eye, advanced atrophic without subfoveal involvement: Secondary | ICD-10-CM | POA: Diagnosis not present

## 2019-02-01 DIAGNOSIS — Z5181 Encounter for therapeutic drug level monitoring: Secondary | ICD-10-CM | POA: Diagnosis not present

## 2019-02-01 DIAGNOSIS — H353212 Exudative age-related macular degeneration, right eye, with inactive choroidal neovascularization: Secondary | ICD-10-CM | POA: Diagnosis not present

## 2019-02-01 DIAGNOSIS — I4891 Unspecified atrial fibrillation: Secondary | ICD-10-CM | POA: Diagnosis not present

## 2019-02-01 DIAGNOSIS — H353221 Exudative age-related macular degeneration, left eye, with active choroidal neovascularization: Secondary | ICD-10-CM | POA: Diagnosis not present

## 2019-02-01 LAB — POCT INR: INR: 3.6 — AB (ref 2.0–3.0)

## 2019-02-01 NOTE — Patient Instructions (Addendum)
Description   Hold today, then start taking 1 tablet daily except 1/2 tablet on Sunday, Wednesday, and Friday. Recheck in 3 weeks. Call us with any medication changes # [phone] [facility].

## 2019-02-15 ENCOUNTER — Ambulatory Visit: Payer: Medicare Other | Admitting: Physical Medicine and Rehabilitation

## 2019-02-22 ENCOUNTER — Encounter: Payer: Self-pay | Admitting: Physical Medicine and Rehabilitation

## 2019-02-22 ENCOUNTER — Ambulatory Visit (INDEPENDENT_AMBULATORY_CARE_PROVIDER_SITE_OTHER): Payer: Medicare Other

## 2019-02-22 ENCOUNTER — Other Ambulatory Visit: Payer: Self-pay

## 2019-02-22 DIAGNOSIS — I4891 Unspecified atrial fibrillation: Secondary | ICD-10-CM | POA: Diagnosis not present

## 2019-02-22 DIAGNOSIS — Z5181 Encounter for therapeutic drug level monitoring: Secondary | ICD-10-CM | POA: Diagnosis not present

## 2019-02-22 LAB — POCT INR: INR: 2.5 (ref 2.0–3.0)

## 2019-02-22 MED ORDER — LIDOCAINE HCL 1 % IJ SOLN
3.0000 mL | INTRAMUSCULAR | Status: AC | PRN
  Administered 2019-01-18: 3 mL

## 2019-02-22 MED ORDER — TRIAMCINOLONE ACETONIDE 40 MG/ML IJ SUSP
40.0000 mg | INTRAMUSCULAR | Status: AC | PRN
  Administered 2019-01-18: 40 mg via INTRAMUSCULAR

## 2019-02-22 NOTE — Patient Instructions (Signed)
Description   Continue on same dosage 1 tablet daily except 1/2 tablet on Sundays, Wednesdays, and Fridays. Recheck in 4 weeks. Call us with any medication changes # [phone] [facility].

## 2019-02-28 NOTE — Progress Notes (Signed)
HPI:  FU permanent atrial fibrillation and SVT. Last echocardiogram in June of 2011 showed normal LV function. There was trivial aortic and mitral regurgitation. Also note she had a cardiac catheterization in December 2003 that showed a 20% first obtuse marginal but otherwise no obstructive disease. She was referred for atrial fibrillation ablation. She had this procedure in June of 2011. Following the procedure she did have recurrent atrial fibrillation. She was seen by Dr. Rayann Heman and consideration of repeat ablation was discussed. However the decision was made for rate control and anticoagulation. 24-hour Holter monitor in December 2015 showed rate was controlled.Nuclear study April 2018 showed ejection fraction 56% and no ischemia.Since I last saw hershe has some dyspnea on exertion but no orthopnea, PND, pedal edema, chest pain or syncope.  Current Outpatient Medications  Medication Sig Dispense Refill  . acetaminophen (TYLENOL) 325 MG tablet Take by mouth every 6 (six) hours as needed for mild pain.    . calcium citrate-vitamin D (CITRACAL+D) 315-200 MG-UNIT per tablet Take 1 tablet by mouth daily.      . diclofenac sodium (VOLTAREN) 1 % GEL Apply 4 g topically 4 (four) times daily. 5 Tube 3  . fish oil-omega-3 fatty acids 1000 MG capsule Take 1 g by mouth 2 (two) times daily.     . fluocinonide (LIDEX) 0.05 % external solution 2 (two) times daily. 1-2 GTTS IN EARS TWICE WEEKLY AS NEEDED     . hydroxypropyl methylcellulose / hypromellose (ISOPTO TEARS / GONIOVISC) 2.5 % ophthalmic solution Place 1 drop into both eyes as needed for dry eyes.    Marland Kitchen levothyroxine (SYNTHROID, LEVOTHROID) 75 MCG tablet Take 75 mcg by mouth daily.      Marland Kitchen losartan (COZAAR) 100 MG tablet Take 100 mg by mouth daily.      . meloxicam (MOBIC) 15 MG tablet Take 15 mg by mouth daily as needed for pain.    . metoprolol tartrate (LOPRESSOR) 50 MG tablet Take 50 mg by mouth 2 (two) times daily.    . Multiple  Vitamins-Minerals (OCUVITE PRESERVISION PO) Take 1 tablet by mouth 2 (two) times daily.    . pantoprazole (PROTONIX) 40 MG tablet Take 40 mg by mouth 2 (two) times daily.    . polyethylene glycol powder (GLYCOLAX/MIRALAX) powder Take 17 g by mouth daily. PRN      . predniSONE (DELTASONE) 20 MG tablet     . promethazine (PHENERGAN) 25 MG tablet Take 25 mg by mouth 3 (three) times daily as needed.  0  . raloxifene (EVISTA) 60 MG tablet Take 60 mg by mouth daily.      . sucralfate (CARAFATE) 1 GM/10ML suspension Take 10 mLs (1 g total) by mouth 4 (four) times daily -  with meals and at bedtime. 420 mL 0  . traMADol (ULTRAM) 50 MG tablet Take 50 mg by mouth every 8 (eight) hours as needed.    . warfarin (COUMADIN) 5 MG tablet TAKE 1/2 TO 1 (ONE-HALF TO ONE) TABLET BY MOUTH ONCE DAILY AS DIRECTED BY  COUMADIN  CLINIC 90 tablet 0   No current facility-administered medications for this visit.      Past Medical History:  Diagnosis Date  . Atrial fibrillation ([place])    ATRIAL FIBRILLATION S/P  PVI 6/11  . CAD (coronary artery disease)    a. 03/2002: cath showing 20% OM1 stenosis, EF at 65%, no WMA. No significant CAD.   Marland Kitchen GERD (gastroesophageal reflux disease)   . Hyperlipidemia   .  Hypertension   . Macular degeneration   . SVT (supraventricular tachycardia) ([facility])     Past Surgical History:  Procedure Laterality Date  . ABLATION OF DYSRHYTHMIC FOCUS  09/2009   s/p PVI by JA  . TOTAL ABDOMINAL HYSTERECTOMY      Social History   Socioeconomic History  . Marital status: Married    Spouse name: Not on file  . Number of children: Not on file  . Years of education: Not on file  . Highest education level: Not on file  Occupational History  . Not on file  Social Needs  . Financial resource strain: Not on file  . Food insecurity    Worry: Not on file    Inability: Not on file  . Transportation needs    Medical: Not on file    Non-medical: Not on file  Tobacco Use  . Smoking status:  Never Smoker  . Smokeless tobacco: Never Used  Substance and Sexual Activity  . Alcohol use: No  . Drug use: No  . Sexual activity: Not on file  Lifestyle  . Physical activity    Days per week: Not on file    Minutes per session: Not on file  . Stress: Not on file  Relationships  . Social Herbalist on phone: Not on file    Gets together: Not on file    Attends religious service: Not on file    Active member of club or organization: Not on file    Attends meetings of clubs or organizations: Not on file    Relationship status: Not on file  . Intimate partner violence    Fear of current or ex partner: Not on file    Emotionally abused: Not on file    Physically abused: Not on file    Forced sexual activity: Not on file  Other Topics Concern  . Not on file  Social History Narrative   Lives with spouse    Family History  Problem Relation Age of Onset  . Stroke Other   . Coronary artery disease Other     ROS: no fevers or chills, productive cough, hemoptysis, dysphasia, odynophagia, melena, hematochezia, dysuria, hematuria, rash, seizure activity, orthopnea, PND, pedal edema, claudication. Remaining systems are negative.  Physical Exam: Well-developed well-nourished in no acute distress.  Skin is warm and dry.  HEENT is normal.  Neck is supple.  Chest is clear to auscultation with normal expansion.  Cardiovascular exam is irregular Abdominal exam nontender or distended. No masses palpated. Extremities show no edema. neuro grossly intact  ECG-atrial fibrillation at a rate of 78, nonspecific ST changes.  Personally reviewed  A/P  1 permanent atrial fibrillation-plan to continue metoprolol for rate control.  Continue Coumadin.  She has not wanted to change to DOACs in the past but would consider if affordable.  We have provided the name of apixaban and Xarelto for her.  We can change pending cost.  Hemoglobin monitored by primary care.  2 hypertension-blood  pressure controlled.  Continue present medical regimen.  Potassium and renal function monitored by primary care.  3 history of SVT-no recurrences.  Continue metoprolol.  Kirk Ruths, MD

## 2019-03-03 ENCOUNTER — Ambulatory Visit (INDEPENDENT_AMBULATORY_CARE_PROVIDER_SITE_OTHER): Payer: Medicare Other | Admitting: Cardiology

## 2019-03-03 ENCOUNTER — Other Ambulatory Visit: Payer: Self-pay

## 2019-03-03 ENCOUNTER — Encounter: Payer: Self-pay | Admitting: Cardiology

## 2019-03-03 VITALS — BP 132/60 | HR 78 | Temp 97.0°F | Ht 61.5 in | Wt 149.0 lb

## 2019-03-03 DIAGNOSIS — I471 Supraventricular tachycardia: Secondary | ICD-10-CM

## 2019-03-03 DIAGNOSIS — I10 Essential (primary) hypertension: Secondary | ICD-10-CM | POA: Diagnosis not present

## 2019-03-03 DIAGNOSIS — I4821 Permanent atrial fibrillation: Secondary | ICD-10-CM | POA: Diagnosis not present

## 2019-03-03 NOTE — Patient Instructions (Signed)
Medication Instructions:  XARELTO OR ELIQUIS TO REPLACE WARFARIN  *If you need a refill on your cardiac medications before your next appointment, please call your pharmacy*  Lab Work: If you have labs (blood work) drawn today and your tests are completely normal, you will receive your results only by: Marland Kitchen MyChart Message (if you have MyChart) OR . A paper copy in the mail If you have any lab test that is abnormal or we need to change your treatment, we will call you to review the results.  Follow-Up: At [facility], you and your health needs are our priority.  As part of our continuing mission to provide you with exceptional heart care, we have created designated Provider Care Teams.  These Care Teams include your primary Cardiologist (physician) and Advanced Practice Providers (APPs -  Physician Assistants and Nurse Practitioners) who all work together to provide you with the care you need, when you need it.  Your next appointment:   12 month(s)  The format for your next appointment:   Either In Person or Virtual  Provider:   You may see Kirk Ruths MD or one of the following Advanced Practice Providers on your designated Care Team:    Kerin Ransom, PA-C  [place], [place]  Coletta Memos, [place]

## 2019-03-04 DIAGNOSIS — J101 Influenza due to other identified influenza virus with other respiratory manifestations: Secondary | ICD-10-CM | POA: Diagnosis not present

## 2019-03-04 DIAGNOSIS — Z9189 Other specified personal risk factors, not elsewhere classified: Secondary | ICD-10-CM | POA: Diagnosis not present

## 2019-03-04 DIAGNOSIS — Z20828 Contact with and (suspected) exposure to other viral communicable diseases: Secondary | ICD-10-CM | POA: Diagnosis not present

## 2019-03-22 ENCOUNTER — Other Ambulatory Visit: Payer: Self-pay

## 2019-03-22 ENCOUNTER — Ambulatory Visit (INDEPENDENT_AMBULATORY_CARE_PROVIDER_SITE_OTHER): Payer: Medicare Other

## 2019-03-22 DIAGNOSIS — H353221 Exudative age-related macular degeneration, left eye, with active choroidal neovascularization: Secondary | ICD-10-CM | POA: Diagnosis not present

## 2019-03-22 DIAGNOSIS — I4891 Unspecified atrial fibrillation: Secondary | ICD-10-CM

## 2019-03-22 DIAGNOSIS — H353123 Nonexudative age-related macular degeneration, left eye, advanced atrophic without subfoveal involvement: Secondary | ICD-10-CM | POA: Diagnosis not present

## 2019-03-22 DIAGNOSIS — Z5181 Encounter for therapeutic drug level monitoring: Secondary | ICD-10-CM

## 2019-03-22 DIAGNOSIS — H3570 Unspecified separation of retinal layers: Secondary | ICD-10-CM | POA: Diagnosis not present

## 2019-03-22 LAB — POCT INR: INR: 2.1 (ref 2.0–3.0)

## 2019-03-22 NOTE — Patient Instructions (Signed)
Description   Continue on same dosage 1 tablet daily except 1/2 tablet on Sundays, Wednesdays, and Fridays. Recheck in 5 weeks. Call us with any medication changes # [phone] Coumadin Clinic.

## 2019-03-26 ENCOUNTER — Other Ambulatory Visit: Payer: Self-pay | Admitting: Cardiology

## 2019-04-19 ENCOUNTER — Telehealth: Payer: Self-pay | Admitting: Cardiology

## 2019-04-19 ENCOUNTER — Ambulatory Visit (INDEPENDENT_AMBULATORY_CARE_PROVIDER_SITE_OTHER): Payer: Medicare Other | Admitting: Interventional Cardiology

## 2019-04-19 DIAGNOSIS — Z7901 Long term (current) use of anticoagulants: Secondary | ICD-10-CM | POA: Diagnosis not present

## 2019-04-19 DIAGNOSIS — Z5181 Encounter for therapeutic drug level monitoring: Secondary | ICD-10-CM | POA: Diagnosis not present

## 2019-04-19 DIAGNOSIS — I482 Chronic atrial fibrillation, unspecified: Secondary | ICD-10-CM | POA: Diagnosis not present

## 2019-04-19 DIAGNOSIS — I129 Hypertensive chronic kidney disease with stage 1 through stage 4 chronic kidney disease, or unspecified chronic kidney disease: Secondary | ICD-10-CM | POA: Diagnosis not present

## 2019-04-19 DIAGNOSIS — R0602 Shortness of breath: Secondary | ICD-10-CM | POA: Diagnosis not present

## 2019-04-19 DIAGNOSIS — U071 COVID-19: Secondary | ICD-10-CM | POA: Diagnosis not present

## 2019-04-19 DIAGNOSIS — Z20828 Contact with and (suspected) exposure to other viral communicable diseases: Secondary | ICD-10-CM | POA: Diagnosis not present

## 2019-04-19 DIAGNOSIS — J189 Pneumonia, unspecified organism: Secondary | ICD-10-CM | POA: Diagnosis not present

## 2019-04-19 DIAGNOSIS — N1831 Chronic kidney disease, stage 3a: Secondary | ICD-10-CM | POA: Diagnosis not present

## 2019-04-19 DIAGNOSIS — R05 Cough: Secondary | ICD-10-CM | POA: Diagnosis not present

## 2019-04-19 LAB — POCT INR: INR: 3.3 — AB (ref 2.0–3.0)

## 2019-04-19 NOTE — Telephone Encounter (Signed)
New Message  Patient has just been diagnosed with COVID-19 by NP Earle Gell. Patient also has bilaterial pneumonia and is being started on antibiotic doxycycline. While in office patient's INR was checked and was high, patient was told to hold coumadin for tonight and resume tomorrow. NP Earle Gell just wanted to call and make the office aware and have the patient follow up with the office.    Earle Gell NP [phone] (cell)

## 2019-04-19 NOTE — Patient Instructions (Signed)
Description    Called and spoke to pt instructed her to hold warfarin today and then continue on same dosage 1 tablet daily except 1/2 tablet on Sundays, Wednesdays, and Fridays. Recheck in 2 weeks if she is not having any covid symptoms. Instructed her to eat extra servings of greens while she is on doxy.Call us with any medication changes # [phone] Coumadin Clinic.

## 2019-04-19 NOTE — Telephone Encounter (Signed)
Called and spoke to Earle Gell who stated that pt's INR was 3.3 today and that pt was going to start doxy 100 mg BID for 7 days. Thanked her for checking pt's INR and informed her we would be in touch with pt.

## 2019-04-20 ENCOUNTER — Other Ambulatory Visit: Payer: Self-pay | Admitting: Nurse Practitioner

## 2019-04-20 DIAGNOSIS — I10 Essential (primary) hypertension: Secondary | ICD-10-CM

## 2019-04-20 DIAGNOSIS — U071 COVID-19: Secondary | ICD-10-CM

## 2019-04-20 NOTE — Progress Notes (Signed)
  I connected by phone with Sylvia [name] on 04/20/2019 at 7:39 AM to discuss the potential use of an new treatment for mild to moderate COVID-19 viral infection in non-hospitalized patients.  This patient is a 86 y.o. female that meets the FDA criteria for Emergency Use Authorization of bamlanivimab or casirivimab\imdevimab.  Has a (+) direct SARS-CoV-2 viral test result  Has mild or moderate COVID-19   Is ? 84 years of age and weighs ? 40 kg  Is NOT hospitalized due to COVID-19  Is NOT requiring oxygen therapy or requiring an increase in baseline oxygen flow rate due to COVID-19  Is within 10 days of symptom onset  Has at least one of the high risk factor(s) for progression to severe COVID-19 and/or hospitalization as defined in EUA.  Specific high risk criteria : Hypertension   I have spoken and communicated the following to the patient or parent/caregiver:  1. FDA has authorized the emergency use of bamlanivimab and casirivimab\imdevimab for the treatment of mild to moderate COVID-19 in adults and pediatric patients with positive results of direct SARS-CoV-2 viral testing who are 38 years of age and older weighing at least 40 kg, and who are at high risk for progressing to severe COVID-19 and/or hospitalization.  2. The significant known and potential risks and benefits of bamlanivimab and casirivimab\imdevimab, and the extent to which such potential risks and benefits are unknown.  3. Information on available alternative treatments and the risks and benefits of those alternatives, including clinical trials.  4. Patients treated with bamlanivimab and casirivimab\imdevimab should continue to self-isolate and use infection control measures (e.g., wear mask, isolate, social distance, avoid sharing personal items, clean and disinfect "high touch" surfaces, and frequent handwashing) according to CDC guidelines.   5. The patient or parent/caregiver has the option to accept or refuse  bamlanivimab or casirivimab\imdevimab .  After reviewing this information with the patient, The patient agreed to proceed with receiving the bamlanimivab infusion and will be provided a copy of the Fact sheet prior to receiving the infusion.[name] 04/20/2019 7:39 AM

## 2019-04-21 ENCOUNTER — Telehealth: Payer: Self-pay | Admitting: Pharmacist

## 2019-04-21 DIAGNOSIS — R112 Nausea with vomiting, unspecified: Secondary | ICD-10-CM | POA: Diagnosis not present

## 2019-04-21 DIAGNOSIS — Z7901 Long term (current) use of anticoagulants: Secondary | ICD-10-CM | POA: Diagnosis not present

## 2019-04-21 DIAGNOSIS — I482 Chronic atrial fibrillation, unspecified: Secondary | ICD-10-CM | POA: Diagnosis not present

## 2019-04-21 DIAGNOSIS — J189 Pneumonia, unspecified organism: Secondary | ICD-10-CM | POA: Diagnosis not present

## 2019-04-21 DIAGNOSIS — U071 COVID-19: Secondary | ICD-10-CM | POA: Diagnosis not present

## 2019-04-21 NOTE — Telephone Encounter (Signed)
Pt called clinic asking which Vitamin K product she should take - she has COVID and has not been eating any greens. Usually eats 3 servings a week. Advised her that while she is not eating any greens (and we are unable to check her INR due to COVID dx), she could take Vitamin K 171mcg tablets - 1 tablet on each day she would have eaten greens. Advised her to stop the supplement when she goes back to eating her normal intake of greens. She verbalized understanding.

## 2019-04-22 ENCOUNTER — Ambulatory Visit (HOSPITAL_COMMUNITY)
Admission: RE | Admit: 2019-04-22 | Discharge: 2019-04-22 | Disposition: A | Discharge status: Home / Self Care | Payer: Medicare Other | Source: Ambulatory Visit | Attending: Pulmonary Disease | Admitting: Pulmonary Disease

## 2019-04-22 DIAGNOSIS — I10 Essential (primary) hypertension: Secondary | ICD-10-CM | POA: Diagnosis not present

## 2019-04-22 DIAGNOSIS — U071 COVID-19: Secondary | ICD-10-CM | POA: Insufficient documentation

## 2019-04-22 DIAGNOSIS — Z23 Encounter for immunization: Secondary | ICD-10-CM | POA: Diagnosis not present

## 2019-04-22 MED ORDER — DIPHENHYDRAMINE HCL 50 MG/ML IJ SOLN: 50.0000 mg | Freq: Once | INTRAMUSCULAR | Status: DC | PRN

## 2019-04-22 MED ORDER — ALBUTEROL SULFATE HFA 108 (90 BASE) MCG/ACT IN AERS: 2.0000 | INHALATION_SPRAY | Freq: Once | RESPIRATORY_TRACT | Status: DC | PRN

## 2019-04-22 MED ORDER — FAMOTIDINE IN NACL 20-0.9 MG/50ML-% IV SOLN: 20.0000 mg | Freq: Once | INTRAVENOUS | Status: DC | PRN

## 2019-04-22 MED ORDER — METHYLPREDNISOLONE SODIUM SUCC 125 MG IJ SOLR: 125.0000 mg | Freq: Once | INTRAMUSCULAR | Status: DC | PRN

## 2019-04-22 MED ORDER — SODIUM CHLORIDE 0.9 % IV SOLN
INTRAVENOUS | Status: DC | PRN
  Administered 2019-04-22: 15:00:00 250 mL via INTRAVENOUS

## 2019-04-22 MED ORDER — SODIUM CHLORIDE 0.9 % IV SOLN
700.0000 mg | Freq: Once | INTRAVENOUS | Status: AC
  Administered 2019-04-22: 700 mg via INTRAVENOUS
  Filled 2019-04-22: qty 20

## 2019-04-22 MED ORDER — EPINEPHRINE 0.3 MG/0.3ML IJ SOAJ: 0.3000 mg | Freq: Once | INTRAMUSCULAR | Status: DC | PRN

## 2019-04-22 NOTE — Discharge Instructions (Signed)
10 Things You Can Do to Manage Your COVID-19 Symptoms at Home If you have possible or confirmed COVID-19: 1. Stay home from work and school. And stay away from other public places. If you must go out, avoid using any kind of public transportation, ridesharing, or taxis. 2. Monitor your symptoms carefully. If your symptoms get worse, call your healthcare provider immediately. 3. Get rest and stay hydrated. 4. If you have a medical appointment, call the healthcare provider ahead of time and tell them that you have or may have COVID-19. 5. For medical emergencies, call 911 and notify the dispatch personnel that you have or may have COVID-19. 6. Cover your cough and sneezes with a tissue or use the inside of your elbow. 7. Wash your hands often with soap and water for at least 20 seconds or clean your hands with an alcohol-based hand sanitizer that contains at least 60% alcohol. 8. As much as possible, stay in a specific room and away from other people in your home. Also, you should use a separate bathroom, if available. If you need to be around other people in or outside of the home, wear a mask. 9. Avoid sharing personal items with other people in your household, like dishes, towels, and bedding. 10. Clean all surfaces that are touched often, like counters, tabletops, and doorknobs. Use household cleaning sprays or wipes according to the label instructions. cdc.gov/coronavirus 10/13/2018 This information is not intended to replace advice given to you by your health care provider. Make sure you discuss any questions you have with your health care provider. Document Revised: 03/17/2019 Document Reviewed: 03/17/2019 Elsevier Patient Education  2020 Else[employer] Inc.  

## 2019-04-22 NOTE — Progress Notes (Signed)
Patient ID: Sylvia [name], female   DOB: 1933/04/09, 86 y.o.   MRN: [id number]  Diagnosis: [id number]  Physician:  Procedure: [facility] Infusion Clinic Med: bamlanivimab infusion - Provided patient with bamlanimivab fact sheet for patients, parents and caregivers prior to infusion.  Complications: No immediate complications noted.  Discharge: Discharged home   Heide Scales 04/22/2019

## 2019-04-26 DIAGNOSIS — U071 COVID-19: Secondary | ICD-10-CM | POA: Diagnosis not present

## 2019-04-26 DIAGNOSIS — I482 Chronic atrial fibrillation, unspecified: Secondary | ICD-10-CM | POA: Diagnosis not present

## 2019-04-26 DIAGNOSIS — Z7901 Long term (current) use of anticoagulants: Secondary | ICD-10-CM | POA: Diagnosis not present

## 2019-04-26 DIAGNOSIS — J1282 Pneumonia due to coronavirus disease 2019: Secondary | ICD-10-CM | POA: Diagnosis not present

## 2019-04-26 DIAGNOSIS — R112 Nausea with vomiting, unspecified: Secondary | ICD-10-CM | POA: Diagnosis not present

## 2019-05-03 ENCOUNTER — Telehealth: Payer: Self-pay | Admitting: Pharmacist

## 2019-05-03 DIAGNOSIS — R112 Nausea with vomiting, unspecified: Secondary | ICD-10-CM | POA: Diagnosis not present

## 2019-05-03 DIAGNOSIS — J1282 Pneumonia due to coronavirus disease 2019: Secondary | ICD-10-CM | POA: Diagnosis not present

## 2019-05-03 DIAGNOSIS — I482 Chronic atrial fibrillation, unspecified: Secondary | ICD-10-CM | POA: Diagnosis not present

## 2019-05-03 DIAGNOSIS — N1831 Chronic kidney disease, stage 3a: Secondary | ICD-10-CM | POA: Diagnosis not present

## 2019-05-03 DIAGNOSIS — Z8616 Personal history of COVID-19: Secondary | ICD-10-CM | POA: Diagnosis not present

## 2019-05-03 DIAGNOSIS — Z7901 Long term (current) use of anticoagulants: Secondary | ICD-10-CM | POA: Diagnosis not present

## 2019-05-03 NOTE — Telephone Encounter (Signed)
Patient called to cancel her coumadin clinic appointment on Friday. States she will be followed by [facility] from now on.

## 2019-05-06 DIAGNOSIS — I482 Chronic atrial fibrillation, unspecified: Secondary | ICD-10-CM | POA: Diagnosis not present

## 2019-05-06 DIAGNOSIS — I129 Hypertensive chronic kidney disease with stage 1 through stage 4 chronic kidney disease, or unspecified chronic kidney disease: Secondary | ICD-10-CM | POA: Diagnosis not present

## 2019-05-06 DIAGNOSIS — N1831 Chronic kidney disease, stage 3a: Secondary | ICD-10-CM | POA: Diagnosis not present

## 2019-05-06 DIAGNOSIS — Z8616 Personal history of COVID-19: Secondary | ICD-10-CM | POA: Diagnosis not present

## 2019-05-31 DIAGNOSIS — I129 Hypertensive chronic kidney disease with stage 1 through stage 4 chronic kidney disease, or unspecified chronic kidney disease: Secondary | ICD-10-CM | POA: Diagnosis not present

## 2019-05-31 DIAGNOSIS — R5383 Other fatigue: Secondary | ICD-10-CM | POA: Diagnosis not present

## 2019-05-31 DIAGNOSIS — N1831 Chronic kidney disease, stage 3a: Secondary | ICD-10-CM | POA: Diagnosis not present

## 2019-05-31 DIAGNOSIS — I482 Chronic atrial fibrillation, unspecified: Secondary | ICD-10-CM | POA: Diagnosis not present

## 2019-05-31 DIAGNOSIS — Z7901 Long term (current) use of anticoagulants: Secondary | ICD-10-CM | POA: Diagnosis not present

## 2019-06-07 ENCOUNTER — Ambulatory Visit: Payer: Self-pay | Admitting: Adult Health

## 2019-06-09 DIAGNOSIS — I482 Chronic atrial fibrillation, unspecified: Secondary | ICD-10-CM | POA: Diagnosis not present

## 2019-06-09 DIAGNOSIS — Z7901 Long term (current) use of anticoagulants: Secondary | ICD-10-CM | POA: Diagnosis not present

## 2019-06-09 DIAGNOSIS — R5383 Other fatigue: Secondary | ICD-10-CM | POA: Diagnosis not present

## 2019-06-16 ENCOUNTER — Ambulatory Visit: Payer: Medicare Other

## 2019-06-16 DIAGNOSIS — Z7901 Long term (current) use of anticoagulants: Secondary | ICD-10-CM | POA: Diagnosis not present

## 2019-06-16 DIAGNOSIS — I482 Chronic atrial fibrillation, unspecified: Secondary | ICD-10-CM | POA: Diagnosis not present

## 2019-06-21 DIAGNOSIS — H35351 Cystoid macular degeneration, right eye: Secondary | ICD-10-CM | POA: Diagnosis not present

## 2019-06-21 DIAGNOSIS — H353113 Nonexudative age-related macular degeneration, right eye, advanced atrophic without subfoveal involvement: Secondary | ICD-10-CM | POA: Diagnosis not present

## 2019-06-21 DIAGNOSIS — H353212 Exudative age-related macular degeneration, right eye, with inactive choroidal neovascularization: Secondary | ICD-10-CM | POA: Diagnosis not present

## 2019-06-21 DIAGNOSIS — H353221 Exudative age-related macular degeneration, left eye, with active choroidal neovascularization: Secondary | ICD-10-CM | POA: Diagnosis not present

## 2019-06-25 ENCOUNTER — Other Ambulatory Visit: Payer: Self-pay | Admitting: Cardiology

## 2019-07-07 ENCOUNTER — Other Ambulatory Visit: Payer: Self-pay | Admitting: Cardiology

## 2019-07-14 DIAGNOSIS — Z7901 Long term (current) use of anticoagulants: Secondary | ICD-10-CM | POA: Diagnosis not present

## 2019-07-14 DIAGNOSIS — I482 Chronic atrial fibrillation, unspecified: Secondary | ICD-10-CM | POA: Diagnosis not present

## 2019-07-21 ENCOUNTER — Other Ambulatory Visit: Payer: Self-pay

## 2019-07-21 ENCOUNTER — Encounter (INDEPENDENT_AMBULATORY_CARE_PROVIDER_SITE_OTHER): Payer: Self-pay | Admitting: Ophthalmology

## 2019-07-21 ENCOUNTER — Ambulatory Visit (INDEPENDENT_AMBULATORY_CARE_PROVIDER_SITE_OTHER): Payer: Medicare Other | Admitting: Ophthalmology

## 2019-07-21 DIAGNOSIS — H353212 Exudative age-related macular degeneration, right eye, with inactive choroidal neovascularization: Secondary | ICD-10-CM | POA: Insufficient documentation

## 2019-07-21 DIAGNOSIS — H35351 Cystoid macular degeneration, right eye: Secondary | ICD-10-CM | POA: Diagnosis not present

## 2019-07-21 DIAGNOSIS — H353221 Exudative age-related macular degeneration, left eye, with active choroidal neovascularization: Secondary | ICD-10-CM | POA: Diagnosis not present

## 2019-07-21 DIAGNOSIS — H43812 Vitreous degeneration, left eye: Secondary | ICD-10-CM | POA: Insufficient documentation

## 2019-07-21 MED ORDER — AFLIBERCEPT 2MG/0.05ML IZ SOLN FOR KALEIDOSCOPE
2.0000 mg | INTRAVITREAL | Status: AC | PRN
  Administered 2019-07-21: 2 mg via INTRAVITREAL

## 2019-07-21 NOTE — Patient Instructions (Signed)
The nature of posterior vitreous detachment was discussed with the patient as well as its physiology, its age prevalence, and its possible implication regarding retinal breaks and detachment.  An informational brochure was given to the patient.  All the patient's questions were answered.  The patient was asked to return if new or different flashes or floaters develops.   Patient was instructed to contact office immediately if any changes were noticed. I explained to the patient that vitreous inside the eye is similar to jello inside a bowl. As the jello melts it can start to pull away from the bowl, similarly the vitreous throughout our lives can begin to pull away from the retina. That process is called a posterior vitreous detachment. In some cases, the vitreous can tug hard enough on the retina to form a retinal tear. I discussed with the patient the signs and symptoms of a retinal detachment.  Do not rub the eye.Age-Related Macular Degeneration  Age-related macular degeneration (AMD) is an eye disease related to aging. The disease causes a loss of central vision. Central vision allows a person to see objects clearly and do daily tasks like reading and driving. There are two main types of AMD:  Dry AMD. People with this type generally lose their vision slowly. This is the most common type of AMD. Some people with dry AMD notice very little change in their vision as they age.  Wet AMD. People with this type can lose their vision quickly. What are the causes? This condition is caused by damage to the part of the eye that provides you with central vision (macula).  Dry AMD happens when deposits in the macula cause light-sensitive cells to slowly break down.  Wet AMD happens when abnormal blood vessels grow under the macula and leak blood and fluid. What increases the risk? You are more likely to develop this condition if you:  Are 44 years old or older, and especially 44 years old or  older.  Smoke.  Are obese.  Have a family history of AMD.  Have high cholesterol, high blood pressure, or heart disease.  Have been exposed to high levels of ultraviolet (UV) light and blue light.  Are white (Caucasian).  Are female. What are the signs or symptoms? Common symptoms of this condition include:  Blurred vision, especially when reading print material. The blurred vision often improves in brighter light.  A blurred or blind spot in the center of your field of vision that is small but growing larger.  Bright colors seeming less bright than they used to be.  Decreased ability to recognize and see faces.  One eye seeing worse than the other.  Decreased ability to adapt to dimly lit rooms.  Straight lines appearing crooked or wavy. How is this diagnosed? This condition is diagnosed based on your symptoms and an eye exam. During the eye exam:  Eye drops will be placed into your eyes to enlarge (dilate) your pupils. This will allow your health care provider to see the back of your eye.  You may be asked to look at an image that looks like a checkerboard (Amsler grid). Early changes in your central vision may cause the grid to appear distorted. After the exam, you may be given one or both of these tests:  Fluorescein angiogram. This test determines whether you have dry or wet AMD.  Optical coherence tomography (OCT) test to evaluate deep layers of the retina. How is this treated? There is no cure for this  condition, but treatment can help to slow down progression of the disease. This condition may be treated with:  Supplements, including vitamin C, vitamin E, beta carotene, and zinc.  Laser surgery to destroy new blood vessels or leaking blood vessels in your eye.  Injections of medicines into your eye to slow down the formation of abnormal blood vessels that may leak. These injections may need to be repeated on a routine basis. Follow these instructions at  home:  Take over-the-counter and prescription medicines only as told by your health care provider.  Take vitamins and supplements as told by your health care provider.  Ask your health care provider for an Amsler grid. Use it every day to check each eye for vision changes.  Get an eye exam as often as told by your health care provider. Make sure to get an eye exam at least once every year.  Keep all follow-up visits as told by your health care provider. This is important. Contact a health care provider if:  You notice any new changes in your vision. Get help right away if:  You suddenly lose vision or develop pain in the eye. Summary  Age-related macular degeneration (AMD) is an eye disease related to aging. There are two types of this condition: dry AMD and wet AMD.  This condition is caused by damage to the part of the eye that provides you with central vision (macula).  Once diagnosed with AMD, make sure to get an eye exam every year, take supplements and vitamins as directed, use an Amsler grid at home, and follow up with your health care provider. This information is not intended to replace advice given to you by your health care provider. Make sure you discuss any questions you have with your health care provider. Document Revised: 10/07/2017 Document Reviewed: 10/07/2017 Elsevier Patient Education  [place].

## 2019-07-21 NOTE — Progress Notes (Signed)
07/21/2019     CHIEF COMPLAINT Patient presents for Retina Follow Up   HISTORY OF PRESENT ILLNESS: Sylvia [name] is a 86 y.o. female who presents to the clinic today for:   HPI    Retina Follow Up    Patient presents with  Wet AMD.  In left eye.  This started 4 weeks ago.  Severity is mild.  Duration of 4 weeks.  Since onset it is gradually improving.          Comments    4 Week AMD F/U OS, poss Eylea OS - OCT  Pt reports improving VA OS since last visit. No changes OD. No new symptoms reported. No flashes or floaters OU.       Last edited by Rockie Neighbours, [place] on 07/21/2019  1:47 PM. (History)      Referring physician: Leanna Battles, MD [place],  [place] [address]  HISTORICAL INFORMATION:   Selected notes from the MEDICAL RECORD NUMBER       CURRENT MEDICATIONS: Current Outpatient Medications (Ophthalmic Drugs)  Medication Sig  . hydroxypropyl methylcellulose / hypromellose (ISOPTO TEARS / GONIOVISC) 2.5 % ophthalmic solution Place 1 drop into both eyes as needed for dry eyes.   No current facility-administered medications for this visit. (Ophthalmic Drugs)   Current Outpatient Medications (Other)  Medication Sig  . acetaminophen (TYLENOL) 325 MG tablet Take by mouth every 6 (six) hours as needed for mild pain.  Marland Kitchen amLODipine (NORVASC) 2.5 MG tablet TAKE 2 TABLETS BY MOUTH IN THE MORNING AND 1 IN THE EVENING  . calcium citrate-vitamin D (CITRACAL+D) 315-200 MG-UNIT per tablet Take 1 tablet by mouth daily.    . diclofenac sodium (VOLTAREN) 1 % GEL Apply 4 g topically 4 (four) times daily.  Marland Kitchen doxycycline (VIBRA-TABS) 100 MG tablet Take 100 mg by mouth 2 (two) times daily.  . fish oil-omega-3 fatty acids 1000 MG capsule Take 1 g by mouth 2 (two) times daily.   . fluocinonide (LIDEX) 0.05 % external solution 2 (two) times daily. 1-2 GTTS IN EARS TWICE WEEKLY AS NEEDED   . levothyroxine (SYNTHROID, LEVOTHROID) 75 MCG tablet Take 75 mcg by mouth daily.     Marland Kitchen losartan (COZAAR) 100 MG tablet Take 100 mg by mouth daily.    . meloxicam (MOBIC) 15 MG tablet Take 15 mg by mouth daily as needed for pain.  . metoprolol tartrate (LOPRESSOR) 50 MG tablet Take 50 mg by mouth 2 (two) times daily.  . Multiple Vitamins-Minerals (OCUVITE PRESERVISION PO) Take 1 tablet by mouth 2 (two) times daily.  . pantoprazole (PROTONIX) 40 MG tablet Take 40 mg by mouth 2 (two) times daily.  . polyethylene glycol powder (GLYCOLAX/MIRALAX) powder Take 17 g by mouth daily. PRN    . predniSONE (DELTASONE) 20 MG tablet   . promethazine (PHENERGAN) 25 MG tablet Take 25 mg by mouth 3 (three) times daily as needed.  . raloxifene (EVISTA) 60 MG tablet Take 60 mg by mouth daily.    . sucralfate (CARAFATE) 1 GM/10ML suspension Take 10 mLs (1 g total) by mouth 4 (four) times daily -  with meals and at bedtime.  . traMADol (ULTRAM) 50 MG tablet Take 50 mg by mouth every 8 (eight) hours as needed.  . warfarin (COUMADIN) 5 MG tablet TAKE 1/2 TO 1 TABLET BY MOUTH ONCE DAILY AS DIRECTED BY COUMADIN CLINIC   No current facility-administered medications for this visit. (Other)      REVIEW OF SYSTEMS:  ALLERGIES Allergies  Allergen Reactions  . Metoclopramide Hcl     REACTION: nervous, climbs the wall  . Penicillins     REACTION: hives  . Pravastatin   . Sulfonamide Derivatives     REACTION: rash    PAST MEDICAL HISTORY Past Medical History:  Diagnosis Date  . Atrial fibrillation ([place])    ATRIAL FIBRILLATION S/P  PVI 6/11  . CAD (coronary artery disease)    a. 03/2002: cath showing 20% OM1 stenosis, EF at 65%, no WMA. No significant CAD.   Marland Kitchen GERD (gastroesophageal reflux disease)   . Hyperlipidemia   . Hypertension   . Macular degeneration   . SVT (supraventricular tachycardia) (HCC)    Past Surgical History:  Procedure Laterality Date  . ABLATION OF DYSRHYTHMIC FOCUS  09/2009   s/p PVI by JA  . TOTAL ABDOMINAL HYSTERECTOMY      FAMILY HISTORY Family  History  Problem Relation Age of Onset  . Stroke Other   . Coronary artery disease Other     SOCIAL HISTORY Social History   Tobacco Use  . Smoking status: Never Smoker  . Smokeless tobacco: Never Used  Substance Use Topics  . Alcohol use: No  . Drug use: No         OPHTHALMIC EXAM:  Base Eye Exam    Visual Acuity (Snellen - Linear)      Right Left   Dist cc 20/400 20/80 +2   Dist ph cc NI 20/50 +2   Correction: Glasses       Tonometry (Tonopen, 1:53 PM)      Right Left   Pressure 11 11       Pupils      Pupils Dark Light Shape React APD   Right PERRL 3 2 Round Brisk None   Left PERRL 3 2 Round Brisk None       Visual Fields (Counting fingers)      Left Right    Full Full       Extraocular Movement      Right Left    Full Full       Neuro/Psych    Oriented x3: Yes   Mood/Affect: Normal       Dilation    Left eye: 1.0% Mydriacyl, 2.5% Phenylephrine @ 1:54 PM        Slit Lamp and Fundus Exam    External Exam      Right Left   External Normal Normal       Slit Lamp Exam      Right Left   Lids/Lashes Normal Normal   Conjunctiva/Sclera White and quiet White and quiet   Cornea Clear Clear   Anterior Chamber Deep and quiet Deep and quiet   Iris Round and reactive Round and reactive   Lens Posterior chamber intraocular lens Posterior chamber intraocular lens   Vitreous Normal Posterior vitreous detachment       Fundus Exam      Right Left   Disc  Normal   C/D Ratio  0.1   Macula  Age related macular degeneration, Atrophy, Early age related macular degeneration, Retinal pigment epithelial atrophy   Vessels  Normal   Periphery  Normal CME.          IMAGING AND PROCEDURES  Imaging and Procedures for @TODAY @  OCT, Retina - OU - Both Eyes       Right Eye Quality was good. Progression has been stable. Findings include cystoid macular edema, choroidal neovascular  membrane.   Left Eye Quality was good. Scan locations included  subfoveal. Progression has improved. Findings include subretinal scarring, central retinal atrophy.   Notes OD, with chronic CME from disc for macular scar OS with a massively improved cystoid macular edema from CN VM after restart of Eylea 4-5 weeks ago       Intravitreal Injection, Pharmacologic Agent - OS - Left Eye       Time Out 07/21/2019. 3:24 PM. Confirmed correct patient, procedure, site, and patient consented.   Anesthesia Topical anesthesia was used. Anesthetic medications included [place] 3.5%.   Procedure Preparation included 10% betadine to eyelids, Tobramycin 0.3%.   Injection:  2 mg aflibercept Alfonse Flavors) SOLN   NDC: [id number], Lot: [id number]   Route: Intravitreal, Site: Left Eye, Waste: 0 mg  Post-op Post injection exam found visual acuity of at least counting fingers. The patient tolerated the procedure well. There were no complications. The patient received written and verbal post procedure care education.                 ASSESSMENT/PLAN:  @PROBAPNOTE @    ICD-10-CM   1. Exudative age-related macular degeneration of left eye with active choroidal neovascularization (HCC)  H35.3221 OCT, Retina - OU - Both Eyes    Intravitreal Injection, Pharmacologic Agent - OS - Left Eye    aflibercept (EYLEA) SOLN 2 mg  2. Exudative age-related macular degeneration of right eye with inactive choroidal neovascularization ([place])  H35.3212   3. Cystoid macular edema of right eye  H35.351   4. Posterior vitreous detachment of left eye  H43.812     1.  2.  3.  Ophthalmic Meds Ordered this visit:  Meds ordered this encounter  Medications  . aflibercept (EYLEA) SOLN 2 mg       Return in about 6 weeks (around 09/01/2019) for EYLEA OCT, OS.  Patient Instructions    The nature of posterior vitreous detachment was discussed with the patient as well as its physiology, its age prevalence, and its possible implication regarding retinal breaks and detachment.  An  informational brochure was given to the patient.  All the patient's questions were answered.  The patient was asked to return if new or different flashes or floaters develops.   Patient was instructed to contact office immediately if any changes were noticed. I explained to the patient that vitreous inside the eye is similar to jello inside a bowl. As the jello melts it can start to pull away from the bowl, similarly the vitreous throughout our lives can begin to pull away from the retina. That process is called a posterior vitreous detachment. In some cases, the vitreous can tug hard enough on the retina to form a retinal tear. I discussed with the patient the signs and symptoms of a retinal detachment.  Do not rub the eye.Age-Related Macular Degeneration  Age-related macular degeneration (AMD) is an eye disease related to aging. The disease causes a loss of central vision. Central vision allows a person to see objects clearly and do daily tasks like reading and driving. There are two main types of AMD:  Dry AMD. People with this type generally lose their vision slowly. This is the most common type of AMD. Some people with dry AMD notice very little change in their vision as they age.  Wet AMD. People with this type can lose their vision quickly. What are the causes? This condition is caused by damage to the part of the eye that provides you with  central vision (macula).  Dry AMD happens when deposits in the macula cause light-sensitive cells to slowly break down.  Wet AMD happens when abnormal blood vessels grow under the macula and leak blood and fluid. What increases the risk? You are more likely to develop this condition if you:  Are 12 years old or older, and especially 69 years old or older.  Smoke.  Are obese.  Have a family history of AMD.  Have high cholesterol, high blood pressure, or heart disease.  Have been exposed to high levels of ultraviolet (UV) light and blue  light.  Are white (Caucasian).  Are female. What are the signs or symptoms? Common symptoms of this condition include:  Blurred vision, especially when reading print material. The blurred vision often improves in brighter light.  A blurred or blind spot in the center of your field of vision that is small but growing larger.  Bright colors seeming less bright than they used to be.  Decreased ability to recognize and see faces.  One eye seeing worse than the other.  Decreased ability to adapt to dimly lit rooms.  Straight lines appearing crooked or wavy. How is this diagnosed? This condition is diagnosed based on your symptoms and an eye exam. During the eye exam:  Eye drops will be placed into your eyes to enlarge (dilate) your pupils. This will allow your health care provider to see the back of your eye.  You may be asked to look at an image that looks like a checkerboard (Amsler grid). Early changes in your central vision may cause the grid to appear distorted. After the exam, you may be given one or both of these tests:  Fluorescein angiogram. This test determines whether you have dry or wet AMD.  Optical coherence tomography (OCT) test to evaluate deep layers of the retina. How is this treated? There is no cure for this condition, but treatment can help to slow down progression of the disease. This condition may be treated with:  Supplements, including vitamin C, vitamin E, beta carotene, and zinc.  Laser surgery to destroy new blood vessels or leaking blood vessels in your eye.  Injections of medicines into your eye to slow down the formation of abnormal blood vessels that may leak. These injections may need to be repeated on a routine basis. Follow these instructions at home:  Take over-the-counter and prescription medicines only as told by your health care provider.  Take vitamins and supplements as told by your health care provider.  Ask your health care provider  for an Amsler grid. Use it every day to check each eye for vision changes.  Get an eye exam as often as told by your health care provider. Make sure to get an eye exam at least once every year.  Keep all follow-up visits as told by your health care provider. This is important. Contact a health care provider if:  You notice any new changes in your vision. Get help right away if:  You suddenly lose vision or develop pain in the eye. Summary  Age-related macular degeneration (AMD) is an eye disease related to aging. There are two types of this condition: dry AMD and wet AMD.  This condition is caused by damage to the part of the eye that provides you with central vision (macula).  Once diagnosed with AMD, make sure to get an eye exam every year, take supplements and vitamins as directed, use an Amsler grid at home, and follow up with  your health care provider. This information is not intended to replace advice given to you by your health care provider. Make sure you discuss any questions you have with your health care provider. Document Revised: 10/07/2017 Document Reviewed: 10/07/2017 Elsevier Patient Education  [place] the diagnoses, plan, and follow up with the patient and they expressed understanding.  Patient expressed understanding of the importance of proper follow up care.   Clent Demark Edom Schmuhl M.D. Diseases & Surgery of the Retina and Vitreous Retina & Diabetic [place] @TODAY @     Abbreviations: M myopia (nearsighted); A astigmatism; H hyperopia (farsighted); P presbyopia; Mrx spectacle prescription;  CTL contact lenses; OD right eye; OS left eye; OU both eyes  XT exotropia; ET esotropia; PEK punctate epithelial keratitis; PEE punctate epithelial erosions; DES dry eye syndrome; MGD meibomian gland dysfunction; ATs artificial tears; PFAT's preservative free artificial tears; [place] nuclear sclerotic cataract; PSC posterior subcapsular cataract; ERM  epi-retinal membrane; PVD posterior vitreous detachment; RD retinal detachment; DM diabetes mellitus; DR diabetic retinopathy; NPDR non-proliferative diabetic retinopathy; PDR proliferative diabetic retinopathy; CSME clinically significant macular edema; DME diabetic macular edema; dbh dot blot hemorrhages; CWS cotton wool spot; POAG primary open angle glaucoma; C/D cup-to-disc ratio; HVF humphrey visual field; GVF goldmann visual field; OCT optical coherence tomography; IOP intraocular pressure; BRVO Branch retinal vein occlusion; CRVO central retinal vein occlusion; CRAO central retinal artery occlusion; BRAO branch retinal artery occlusion; RT retinal tear; SB scleral buckle; PPV pars plana vitrectomy; VH Vitreous hemorrhage; PRP panretinal laser photocoagulation; IVK intravitreal kenalog; VMT vitreomacular traction; MH Macular hole;  NVD neovascularization of the disc; NVE neovascularization elsewhere; AREDS age related eye disease study; ARMD age related macular degeneration; POAG primary open angle glaucoma; EBMD epithelial/anterior basement membrane dystrophy; ACIOL anterior chamber intraocular lens; IOL intraocular lens; PCIOL posterior chamber intraocular lens; Phaco/IOL phacoemulsification with intraocular lens placement; [place] photorefractive keratectomy; LASIK laser assisted in situ keratomileusis; HTN hypertension; DM diabetes mellitus; COPD chronic obstructive pulmonary disease

## 2019-08-08 ENCOUNTER — Ambulatory Visit: Payer: Medicare Other

## 2019-08-09 ENCOUNTER — Ambulatory Visit: Payer: Medicare Other | Attending: Internal Medicine

## 2019-08-09 DIAGNOSIS — Z23 Encounter for immunization: Secondary | ICD-10-CM

## 2019-08-15 DIAGNOSIS — I482 Chronic atrial fibrillation, unspecified: Secondary | ICD-10-CM | POA: Diagnosis not present

## 2019-08-15 DIAGNOSIS — Z7901 Long term (current) use of anticoagulants: Secondary | ICD-10-CM | POA: Diagnosis not present

## 2019-08-29 DIAGNOSIS — H6121 Impacted cerumen, right ear: Secondary | ICD-10-CM | POA: Diagnosis not present

## 2019-08-30 ENCOUNTER — Other Ambulatory Visit: Payer: Self-pay

## 2019-08-30 ENCOUNTER — Encounter (INDEPENDENT_AMBULATORY_CARE_PROVIDER_SITE_OTHER): Payer: Self-pay | Admitting: Ophthalmology

## 2019-08-30 ENCOUNTER — Ambulatory Visit (INDEPENDENT_AMBULATORY_CARE_PROVIDER_SITE_OTHER): Payer: Medicare Other | Admitting: Ophthalmology

## 2019-08-30 DIAGNOSIS — H353221 Exudative age-related macular degeneration, left eye, with active choroidal neovascularization: Secondary | ICD-10-CM

## 2019-08-30 MED ORDER — AFLIBERCEPT 2MG/0.05ML IZ SOLN FOR KALEIDOSCOPE
2.0000 mg | INTRAVITREAL | Status: AC | PRN
  Administered 2019-08-30: 2 mg via INTRAVITREAL

## 2019-08-30 NOTE — Progress Notes (Signed)
08/30/2019     CHIEF COMPLAINT Patient presents for Retina Follow Up   HISTORY OF PRESENT ILLNESS: Sylvia [name] is a 86 y.o. female who presents to the clinic today for:   HPI    Retina Follow Up    Patient presents with  Wet AMD.  In left eye.  Duration of 6 weeks.  Since onset it is stable.          Comments    6 week follow up- OCT OU, Possible Eylea OS Patient denies change in vision and overall has no complaints.        Last edited by Gerda Diss on 08/30/2019  2:18 PM. (History)      Referring physician: Leanna Battles, MD [place],  [place] [address]  HISTORICAL INFORMATION:   Selected notes from the MEDICAL RECORD NUMBER       CURRENT MEDICATIONS: Current Outpatient Medications (Ophthalmic Drugs)  Medication Sig  . hydroxypropyl methylcellulose / hypromellose (ISOPTO TEARS / GONIOVISC) 2.5 % ophthalmic solution Place 1 drop into both eyes as needed for dry eyes.   No current facility-administered medications for this visit. (Ophthalmic Drugs)   Current Outpatient Medications (Other)  Medication Sig  . acetaminophen (TYLENOL) 325 MG tablet Take by mouth every 6 (six) hours as needed for mild pain.  Marland Kitchen amLODipine (NORVASC) 2.5 MG tablet TAKE 2 TABLETS BY MOUTH IN THE MORNING AND 1 IN THE EVENING  . calcium citrate-vitamin D (CITRACAL+D) 315-200 MG-UNIT per tablet Take 1 tablet by mouth daily.    . diclofenac sodium (VOLTAREN) 1 % GEL Apply 4 g topically 4 (four) times daily.  . fish oil-omega-3 fatty acids 1000 MG capsule Take 1 g by mouth 2 (two) times daily.   . fluocinonide (LIDEX) 0.05 % external solution 2 (two) times daily. 1-2 GTTS IN EARS TWICE WEEKLY AS NEEDED   . levothyroxine (SYNTHROID, LEVOTHROID) 75 MCG tablet Take 75 mcg by mouth daily.    Marland Kitchen losartan (COZAAR) 100 MG tablet Take 100 mg by mouth daily.    . metoprolol tartrate (LOPRESSOR) 50 MG tablet Take 50 mg by mouth 2 (two) times daily.  . Multiple Vitamins-Minerals  (OCUVITE PRESERVISION PO) Take 1 tablet by mouth 2 (two) times daily.  . pantoprazole (PROTONIX) 40 MG tablet Take 40 mg by mouth 2 (two) times daily.  . raloxifene (EVISTA) 60 MG tablet Take 60 mg by mouth daily.    Marland Kitchen warfarin (COUMADIN) 5 MG tablet TAKE 1/2 TO 1 TABLET BY MOUTH ONCE DAILY AS DIRECTED BY COUMADIN CLINIC  . doxycycline (VIBRA-TABS) 100 MG tablet Take 100 mg by mouth 2 (two) times daily.  . meloxicam (MOBIC) 15 MG tablet Take 15 mg by mouth daily as needed for pain.  . polyethylene glycol powder (GLYCOLAX/MIRALAX) powder Take 17 g by mouth daily. PRN    . predniSONE (DELTASONE) 20 MG tablet   . promethazine (PHENERGAN) 25 MG tablet Take 25 mg by mouth 3 (three) times daily as needed.  . sucralfate (CARAFATE) 1 GM/10ML suspension Take 10 mLs (1 g total) by mouth 4 (four) times daily -  with meals and at bedtime. (Patient not taking: Reported on 08/30/2019)  . traMADol (ULTRAM) 50 MG tablet Take 50 mg by mouth every 8 (eight) hours as needed.   No current facility-administered medications for this visit. (Other)      REVIEW OF SYSTEMS:    ALLERGIES Allergies  Allergen Reactions  . Metoclopramide Hcl     REACTION: nervous,  climbs the wall  . Penicillins     REACTION: hives  . Pravastatin   . Sulfonamide Derivatives     REACTION: rash    PAST MEDICAL HISTORY Past Medical History:  Diagnosis Date  . Atrial fibrillation ([place])    ATRIAL FIBRILLATION S/P  PVI 6/11  . CAD (coronary artery disease)    a. 03/2002: cath showing 20% OM1 stenosis, EF at 65%, no WMA. No significant CAD.   Marland Kitchen GERD (gastroesophageal reflux disease)   . Hyperlipidemia   . Hypertension   . Macular degeneration   . SVT (supraventricular tachycardia) (HCC)    Past Surgical History:  Procedure Laterality Date  . ABLATION OF DYSRHYTHMIC FOCUS  09/2009   s/p PVI by JA  . TOTAL ABDOMINAL HYSTERECTOMY      FAMILY HISTORY Family History  Problem Relation Age of Onset  . Stroke Other   .  Coronary artery disease Other     SOCIAL HISTORY Social History   Tobacco Use  . Smoking status: Never Smoker  . Smokeless tobacco: Never Used  Substance Use Topics  . Alcohol use: No  . Drug use: No         OPHTHALMIC EXAM:  Base Eye Exam    Visual Acuity (Snellen - Linear)      Right Left   Dist cc 20/400 20/60+1   Dist ph cc  20/30-2   Correction: Glasses       Tonometry (Tonopen, 2:25 PM)      Right Left   Pressure 12 12       Pupils      Pupils Dark Light Shape React APD   Right PERRL 3 2 Round Brisk None   Left PERRL 3 2 Round Brisk None       Visual Fields (Counting fingers)      Left Right    Full Full       Extraocular Movement      Right Left    Full Full       Neuro/Psych    Oriented x3: Yes   Mood/Affect: Normal       Dilation    Left eye: 1.0% Mydriacyl, 2.5% Phenylephrine @ 2:25 PM        Slit Lamp and Fundus Exam    External Exam      Right Left   External Normal Normal       Slit Lamp Exam      Right Left   Lids/Lashes Normal Normal   Conjunctiva/Sclera White and quiet White and quiet   Cornea Clear Clear   Anterior Chamber Deep and quiet Deep and quiet   Iris Round and reactive Round and reactive   Lens Clear Clear   Anterior Vitreous Normal Normal       Fundus Exam      Right Left   Posterior Vitreous Normal Posterior vitreous detachment   Disc  Normal   C/D Ratio  0.2   Macula  Age related macular degeneration, Atrophy, Early age related macular degeneration, Retinal pigment epithelial atrophy, Geographic atrophy   Vessels  Normal   Periphery  Normal CME.          IMAGING AND PROCEDURES  Imaging and Procedures for 08/30/19  OCT, Retina - OU - Both Eyes       Right Eye Quality was good. Scan locations included subfoveal. Central Foveal Thickness: 590. Progression has been stable. Findings include abnormal foveal contour, intraretinal hyper-reflective material, cystoid macular edema, subretinal scarring.  Left Eye Quality was good. Scan locations included subfoveal. Central Foveal Thickness: 255. Progression has improved. Findings include abnormal foveal contour.   Notes OD with chronic disciform scar and massive CME.  Overall stable no change  OS, extensive geographic atrophy Sparing of the central foveal region.  Less CME, in the macular region.       Intravitreal Injection, Pharmacologic Agent - OS - Left Eye       Time Out 08/30/2019. 3:30 PM. Confirmed correct patient, procedure, site, and patient consented.   Anesthesia Topical anesthesia was used. Anesthetic medications included Akten 3.5%.   Procedure Preparation included 10% betadine to eyelids, Tobramycin 0.3%. A 30 gauge needle was used.   Injection:  2 mg aflibercept Alfonse Flavors) SOLN   NDC: A3590391, Lot: [id number]   Route: Intravitreal, Site: Left Eye, Waste: 0 mg  Post-op Post injection exam found visual acuity of at least counting fingers. The patient tolerated the procedure well. There were no complications. The patient received written and verbal post procedure care education. Post injection medications were not given.                 ASSESSMENT/PLAN:  Exudative age-related macular degeneration of left eye with active choroidal neovascularization (HCC) The nature of wet macular degeneration was discussed with the patient.  Forms of therapy reviewed include the use of Anti-VEGF medications injected painlessly into the eye, as well as other possible treatment modalities, including thermal laser therapy. Fellow eye involvement and risks were discussed with the patient. Upon the finding of wet age related macular degeneration, treatment will be offered. The treatment regimen is on a treat as needed basis with the intent to treat if necessary and extend interval of exams when possible. On average 1 out of 6 patients do not need lifetime therapy. However, the risk of recurrent disease is high for a lifetime.   Initially monthly, then periodic, examinations and evaluations will determine whether the next treatment is required on the day of the examination.      ICD-10-CM   1. Exudative age-related macular degeneration of left eye with active choroidal neovascularization (HCC)  H35.3221 OCT, Retina - OU - Both Eyes    Intravitreal Injection, Pharmacologic Agent - OS - Left Eye    aflibercept (EYLEA) SOLN 2 mg    1.  Repeat injection intravitreal Eylea OS today for wet AMD.  Currently at 6-week interval.  Will reexamine OS in 7 weeks with possible Eylea injection  2.  3.  Ophthalmic Meds Ordered this visit:  Meds ordered this encounter  Medications  . aflibercept (EYLEA) SOLN 2 mg       No follow-ups on file.  There are no Patient Instructions on file for this visit.   Explained the diagnoses, plan, and follow up with the patient and they expressed understanding.  Patient expressed understanding of the importance of proper follow up care.   Clent Demark Makenzee Choudhry M.D. Diseases & Surgery of the Retina and Vitreous Retina & Diabetic [place] 08/30/19     Abbreviations: M myopia (nearsighted); A astigmatism; H hyperopia (farsighted); P presbyopia; Mrx spectacle prescription;  CTL contact lenses; OD right eye; OS left eye; OU both eyes  XT exotropia; ET esotropia; PEK punctate epithelial keratitis; PEE punctate epithelial erosions; DES dry eye syndrome; MGD meibomian gland dysfunction; ATs artificial tears; PFAT's preservative free artificial tears; [place] nuclear sclerotic cataract; PSC posterior subcapsular cataract; ERM epi-retinal membrane; PVD posterior vitreous detachment; RD retinal detachment; DM diabetes mellitus; DR diabetic retinopathy;  NPDR non-proliferative diabetic retinopathy; PDR proliferative diabetic retinopathy; CSME clinically significant macular edema; DME diabetic macular edema; dbh dot blot hemorrhages; CWS cotton wool spot; POAG primary open angle glaucoma; C/D cup-to-disc  ratio; HVF humphrey visual field; GVF goldmann visual field; OCT optical coherence tomography; IOP intraocular pressure; BRVO Branch retinal vein occlusion; CRVO central retinal vein occlusion; CRAO central retinal artery occlusion; BRAO branch retinal artery occlusion; RT retinal tear; SB scleral buckle; PPV pars plana vitrectomy; VH Vitreous hemorrhage; PRP panretinal laser photocoagulation; IVK intravitreal kenalog; VMT vitreomacular traction; MH Macular hole;  NVD neovascularization of the disc; NVE neovascularization elsewhere; AREDS age related eye disease study; ARMD age related macular degeneration; POAG primary open angle glaucoma; EBMD epithelial/anterior basement membrane dystrophy; ACIOL anterior chamber intraocular lens; IOL intraocular lens; PCIOL posterior chamber intraocular lens; Phaco/IOL phacoemulsification with intraocular lens placement; [place] photorefractive keratectomy; LASIK laser assisted in situ keratomileusis; HTN hypertension; DM diabetes mellitus; COPD chronic obstructive pulmonary disease

## 2019-08-30 NOTE — Assessment & Plan Note (Signed)

## 2019-09-05 ENCOUNTER — Ambulatory Visit: Payer: Medicare Other | Attending: Internal Medicine

## 2019-09-05 DIAGNOSIS — Z23 Encounter for immunization: Secondary | ICD-10-CM

## 2019-09-05 NOTE — Progress Notes (Signed)
   Covid-19 Vaccination Clinic  Name:  NEEMA CAUSER    MRN: [id number] DOB: 12/05/1932  09/05/2019  Ms. Sprenkle was observed post Covid-19 immunization for 15 minutes without incident. She was provided with Vaccine Information Sheet and instruction to access the V-Safe system.   Ms. Crowell was instructed to call 911 with any severe reactions post vaccine: Marland Kitchen Difficulty breathing  . Swelling of face and throat  . A fast heartbeat  . A bad rash all over body  . Dizziness and weakness   Immunizations Administered    Name Date Dose VIS Date Route   Pfizer COVID-19 Vaccine 09/05/2019  1:52 PM 0.3 mL 06/08/2018 Intramuscular   Manufacturer: [place]   Lot: [id number]   [place]: [id number]

## 2019-09-13 DIAGNOSIS — I482 Chronic atrial fibrillation, unspecified: Secondary | ICD-10-CM | POA: Diagnosis not present

## 2019-09-13 DIAGNOSIS — Z7901 Long term (current) use of anticoagulants: Secondary | ICD-10-CM | POA: Diagnosis not present

## 2019-10-18 ENCOUNTER — Other Ambulatory Visit: Payer: Self-pay

## 2019-10-18 ENCOUNTER — Encounter (INDEPENDENT_AMBULATORY_CARE_PROVIDER_SITE_OTHER): Payer: Self-pay | Admitting: Ophthalmology

## 2019-10-18 ENCOUNTER — Ambulatory Visit (INDEPENDENT_AMBULATORY_CARE_PROVIDER_SITE_OTHER): Payer: Medicare Other | Admitting: Ophthalmology

## 2019-10-18 DIAGNOSIS — H353221 Exudative age-related macular degeneration, left eye, with active choroidal neovascularization: Secondary | ICD-10-CM | POA: Diagnosis not present

## 2019-10-18 MED ORDER — AFLIBERCEPT 2MG/0.05ML IZ SOLN FOR KALEIDOSCOPE
2.0000 mg | INTRAVITREAL | Status: AC | PRN
  Administered 2019-10-18: 2 mg via INTRAVITREAL

## 2019-10-18 MED ORDER — BEVACIZUMAB CHEMO INJECTION 1.25MG/0.05ML SYRINGE FOR KALEIDOSCOPE
1.2500 mg | INTRAVITREAL | Status: AC | PRN
  Administered 2019-10-18: 1.25 mg via INTRAVITREAL

## 2019-10-18 NOTE — Progress Notes (Signed)
10/18/2019     CHIEF COMPLAINT Patient presents for Retina Follow Up   HISTORY OF PRESENT ILLNESS: Sylvia [name] is a 86 y.o. female who presents to the clinic today for:   HPI    Retina Follow Up    Patient presents with  Wet AMD.  In left eye.  Duration of 7 weeks.  Since onset it is stable.          Comments    7 week follow up - OCT OU, Poss Eylea OS Patient denies change in vision and overall has no complaints.        Last edited by Gerda Diss on 10/18/2019  1:00 PM. (History)      Referring physician: Leanna Battles, MD [place],  [place] [address]  HISTORICAL INFORMATION:   Selected notes from the MEDICAL RECORD NUMBER       CURRENT MEDICATIONS: Current Outpatient Medications (Ophthalmic Drugs)  Medication Sig  . hydroxypropyl methylcellulose / hypromellose (ISOPTO TEARS / GONIOVISC) 2.5 % ophthalmic solution Place 1 drop into both eyes as needed for dry eyes.   No current facility-administered medications for this visit. (Ophthalmic Drugs)   Current Outpatient Medications (Other)  Medication Sig  . acetaminophen (TYLENOL) 325 MG tablet Take by mouth every 6 (six) hours as needed for mild pain.  Marland Kitchen amLODipine (NORVASC) 2.5 MG tablet TAKE 2 TABLETS BY MOUTH IN THE MORNING AND 1 IN THE EVENING  . calcium citrate-vitamin D (CITRACAL+D) 315-200 MG-UNIT per tablet Take 1 tablet by mouth daily.    . diclofenac sodium (VOLTAREN) 1 % GEL Apply 4 g topically 4 (four) times daily.  Marland Kitchen doxycycline (VIBRA-TABS) 100 MG tablet Take 100 mg by mouth 2 (two) times daily.  . fish oil-omega-3 fatty acids 1000 MG capsule Take 1 g by mouth 2 (two) times daily.   . fluocinonide (LIDEX) 0.05 % external solution 2 (two) times daily. 1-2 GTTS IN EARS TWICE WEEKLY AS NEEDED   . levothyroxine (SYNTHROID, LEVOTHROID) 75 MCG tablet Take 75 mcg by mouth daily.    Marland Kitchen losartan (COZAAR) 100 MG tablet Take 100 mg by mouth daily.    . meloxicam (MOBIC) 15 MG tablet Take 15  mg by mouth daily as needed for pain.  . metoprolol tartrate (LOPRESSOR) 50 MG tablet Take 50 mg by mouth 2 (two) times daily.  . Multiple Vitamins-Minerals (OCUVITE PRESERVISION PO) Take 1 tablet by mouth 2 (two) times daily.  . pantoprazole (PROTONIX) 40 MG tablet Take 40 mg by mouth 2 (two) times daily.  . polyethylene glycol powder (GLYCOLAX/MIRALAX) powder Take 17 g by mouth daily. PRN    . predniSONE (DELTASONE) 20 MG tablet   . promethazine (PHENERGAN) 25 MG tablet Take 25 mg by mouth 3 (three) times daily as needed.  . raloxifene (EVISTA) 60 MG tablet Take 60 mg by mouth daily.    . sucralfate (CARAFATE) 1 GM/10ML suspension Take 10 mLs (1 g total) by mouth 4 (four) times daily -  with meals and at bedtime. (Patient not taking: Reported on 08/30/2019)  . traMADol (ULTRAM) 50 MG tablet Take 50 mg by mouth every 8 (eight) hours as needed.  . warfarin (COUMADIN) 5 MG tablet TAKE 1/2 TO 1 TABLET BY MOUTH ONCE DAILY AS DIRECTED BY COUMADIN CLINIC   No current facility-administered medications for this visit. (Other)      REVIEW OF SYSTEMS:    ALLERGIES Allergies  Allergen Reactions  . Metoclopramide Hcl     REACTION:  nervous, climbs the wall  . Penicillins     REACTION: hives  . Pravastatin   . Sulfonamide Derivatives     REACTION: rash    PAST MEDICAL HISTORY Past Medical History:  Diagnosis Date  . Atrial fibrillation ([place])    ATRIAL FIBRILLATION S/P  PVI 6/11  . CAD (coronary artery disease)    a. 03/2002: cath showing 20% OM1 stenosis, EF at 65%, no WMA. No significant CAD.   Marland Kitchen GERD (gastroesophageal reflux disease)   . Hyperlipidemia   . Hypertension   . Macular degeneration   . SVT (supraventricular tachycardia) (HCC)    Past Surgical History:  Procedure Laterality Date  . ABLATION OF DYSRHYTHMIC FOCUS  09/2009   s/p PVI by JA  . TOTAL ABDOMINAL HYSTERECTOMY      FAMILY HISTORY Family History  Problem Relation Age of Onset  . Stroke Other   . Coronary  artery disease Other     SOCIAL HISTORY Social History   Tobacco Use  . Smoking status: Never Smoker  . Smokeless tobacco: Never Used  Vaping Use  . Vaping Use: Never used  Substance Use Topics  . Alcohol use: No  . Drug use: No         OPHTHALMIC EXAM:  Base Eye Exam    Visual Acuity (Snellen - Linear)      Right Left   Dist cc 20/400 20/50+2   Dist ph cc  20/40-2       Tonometry (Tonopen, 1:08 PM)      Right Left   Pressure 9 8       Pupils      Pupils Dark Light Shape React APD   Right PERRL 3 2 Round Brisk None   Left PERRL 3 2 Round Brisk None       Visual Fields (Counting fingers)      Left Right    Full Full       Extraocular Movement      Right Left    Full Full       Neuro/Psych    Oriented x3: Yes   Mood/Affect: Normal       Dilation    Left eye: 1.0% Mydriacyl, 2.5% Phenylephrine @ 1:08 PM        Slit Lamp and Fundus Exam    External Exam      Right Left   External Normal Normal       Slit Lamp Exam      Right Left   Lids/Lashes Normal Normal   Conjunctiva/Sclera White and quiet White and quiet   Cornea Clear Clear   Anterior Chamber Deep and quiet Deep and quiet   Iris Round and reactive Round and reactive   Lens Posterior chamber intraocular lens Posterior chamber intraocular lens   Anterior Vitreous Normal Normal       Fundus Exam      Right Left   Posterior Vitreous Normal Posterior vitreous detachment   Disc  Normal   C/D Ratio  0.2   Macula  Age related macular degeneration, Atrophy, Early age related macular degeneration, Retinal pigment epithelial atrophy, Geographic atrophy   Vessels  Normal   Periphery  Normal CME.          IMAGING AND PROCEDURES  Imaging and Procedures for 10/18/19  OCT, Retina - OU - Both Eyes       Right Eye Quality was good. Scan locations included subfoveal. Central Foveal Thickness: 487. Progression has improved. Findings include  abnormal foveal contour, cystoid macular edema,  choroidal neovascular membrane.   Left Eye Quality was good. Scan locations included subfoveal. Central Foveal Thickness: 244. Progression has been stable. Findings include abnormal foveal contour, central retinal atrophy, outer retinal atrophy, inner retinal atrophy.   Notes OD, stable, subfoveal CNVM not progressing.  Poor visual prognosis, stable overall  This, extensive dry ARMD yet still improved with much less CME on therapy.  Currently at 7-week interval OS, OS       Intravitreal Injection, Pharmacologic Agent - OS - Left Eye       Time Out 10/18/2019. 1:47 PM. Confirmed correct patient, procedure, site, and patient consented.   Anesthesia Topical anesthesia was used. Anesthetic medications included Akten 3.5%.   Procedure Preparation included 10% betadine to eyelids, Tobramycin 0.3%. A 30 gauge needle was used.   Injection:  1.25 mg Bevacizumab (AVASTIN) SOLN   NDC: [address]-2   Route: Intravitreal, Site: Left Eye, Waste: 0 mg  Post-op Post injection exam found visual acuity of at least counting fingers. The patient tolerated the procedure well. There were no complications. The patient received written and verbal post procedure care education.        Intravitreal Injection, Pharmacologic Agent - OS - Left Eye       Time Out 10/18/2019. 1:54 PM. Confirmed correct patient, procedure, site, and patient consented.   Anesthesia Topical anesthesia was used. Anesthetic medications included Akten 3.5%.   Procedure Preparation included Tobramycin 0.3%, 10% betadine to eyelids. A 30 gauge needle was used.   Injection:  2 mg aflibercept Alfonse Flavors) SOLN   NDC: A3590391, Lot: [id number]   Route: Intravitreal, Site: Left Eye, Waste: 0 mg  Post-op Post injection exam found visual acuity of at least counting fingers. The patient tolerated the procedure well. There were no complications. The patient received written and verbal post procedure care education. Post injection  medications were not given.   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  We will repeat injection intravitreal Eylea today      ICD-10-CM   1. Exudative age-related macular degeneration of left eye with active choroidal neovascularization (HCC)  H35.3221 OCT, Retina - OU - Both Eyes    Intravitreal Injection, Pharmacologic Agent - OS - Left Eye    Bevacizumab (AVASTIN) SOLN 1.25 mg    Intravitreal Injection, Pharmacologic Agent - OS - Left Eye    aflibercept (EYLEA) SOLN 2 mg    1.  OS today for recurrent CNVM, will repeat examination in 7 weeks  2.  For clarity sake, intravitreal Avastin was not delivered today OS, only aflibercept, Eylea was delivered today  3.  Clinical staff and physician are unable to edit this note to remove the procedure referring to intravitreal Avastin    Ophthalmic Meds Ordered this visit:  Meds ordered this encounter  Medications  . Bevacizumab (AVASTIN) SOLN 1.25 mg  . aflibercept (EYLEA) SOLN 2 mg       Return in about 8 weeks (around 12/13/2019) for dilate, OS, EYLEA OCT.  There are no Patient Instructions on file for this visit.   Explained the diagnoses, plan, and follow up with the patient and they expressed understanding.  Patient expressed understanding of  the importance of proper follow up care.   Clent Demark Lakena Sparlin M.D. Diseases & Surgery of the Retina and Vitreous Retina & Diabetic [place] 10/18/19     Abbreviations: M myopia (nearsighted); A astigmatism; H hyperopia (farsighted); P presbyopia; Mrx spectacle prescription;  CTL contact lenses; OD right eye; OS left eye; OU both eyes  XT exotropia; ET esotropia; PEK punctate epithelial keratitis; PEE  punctate epithelial erosions; DES dry eye syndrome; MGD meibomian gland dysfunction; ATs artificial tears; PFAT's preservative free artificial tears; [place] nuclear sclerotic cataract; PSC posterior subcapsular cataract; ERM epi-retinal membrane; PVD posterior vitreous detachment; RD retinal detachment; DM diabetes mellitus; DR diabetic retinopathy; NPDR non-proliferative diabetic retinopathy; PDR proliferative diabetic retinopathy; CSME clinically significant macular edema; DME diabetic macular edema; dbh dot blot hemorrhages; CWS cotton wool spot; POAG primary open angle glaucoma; C/D cup-to-disc ratio; HVF humphrey visual field; GVF goldmann visual field; OCT optical coherence tomography; IOP intraocular pressure; BRVO Branch retinal vein occlusion; CRVO central retinal vein occlusion; CRAO central retinal artery occlusion; BRAO branch retinal artery occlusion; RT retinal tear; SB scleral buckle; PPV pars plana vitrectomy; VH Vitreous hemorrhage; PRP panretinal laser photocoagulation; IVK intravitreal kenalog; VMT vitreomacular traction; MH Macular hole;  NVD neovascularization of the disc; NVE neovascularization elsewhere; AREDS age related eye disease study; ARMD age related macular degeneration; POAG primary open angle glaucoma; EBMD epithelial/anterior basement membrane dystrophy; ACIOL anterior chamber intraocular lens; IOL intraocular lens; PCIOL posterior chamber intraocular lens; Phaco/IOL phacoemulsification with intraocular lens placement; [place] photorefractive keratectomy; LASIK laser assisted in situ keratomileusis; HTN hypertension; DM diabetes mellitus; COPD chronic obstructive pulmonary disease

## 2019-10-18 NOTE — Assessment & Plan Note (Addendum)
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  We will repeat injection intravitreal Eylea today

## 2019-11-10 DIAGNOSIS — E7849 Other hyperlipidemia: Secondary | ICD-10-CM | POA: Diagnosis not present

## 2019-11-10 DIAGNOSIS — E038 Other specified hypothyroidism: Secondary | ICD-10-CM | POA: Diagnosis not present

## 2019-11-17 DIAGNOSIS — Z7901 Long term (current) use of anticoagulants: Secondary | ICD-10-CM | POA: Diagnosis not present

## 2019-11-17 DIAGNOSIS — H3530 Unspecified macular degeneration: Secondary | ICD-10-CM | POA: Diagnosis not present

## 2019-11-17 DIAGNOSIS — I129 Hypertensive chronic kidney disease with stage 1 through stage 4 chronic kidney disease, or unspecified chronic kidney disease: Secondary | ICD-10-CM | POA: Diagnosis not present

## 2019-11-17 DIAGNOSIS — E785 Hyperlipidemia, unspecified: Secondary | ICD-10-CM | POA: Diagnosis not present

## 2019-11-17 DIAGNOSIS — R82998 Other abnormal findings in urine: Secondary | ICD-10-CM | POA: Diagnosis not present

## 2019-11-17 DIAGNOSIS — I4891 Unspecified atrial fibrillation: Secondary | ICD-10-CM | POA: Diagnosis not present

## 2019-11-17 DIAGNOSIS — Z1212 Encounter for screening for malignant neoplasm of rectum: Secondary | ICD-10-CM | POA: Diagnosis not present

## 2019-11-17 DIAGNOSIS — F419 Anxiety disorder, unspecified: Secondary | ICD-10-CM | POA: Diagnosis not present

## 2019-11-17 DIAGNOSIS — D649 Anemia, unspecified: Secondary | ICD-10-CM | POA: Diagnosis not present

## 2019-11-17 DIAGNOSIS — M545 Low back pain: Secondary | ICD-10-CM | POA: Diagnosis not present

## 2019-11-17 DIAGNOSIS — G4733 Obstructive sleep apnea (adult) (pediatric): Secondary | ICD-10-CM | POA: Diagnosis not present

## 2019-11-17 DIAGNOSIS — M170 Bilateral primary osteoarthritis of knee: Secondary | ICD-10-CM | POA: Diagnosis not present

## 2019-11-17 DIAGNOSIS — N1831 Chronic kidney disease, stage 3a: Secondary | ICD-10-CM | POA: Diagnosis not present

## 2019-11-17 DIAGNOSIS — E039 Hypothyroidism, unspecified: Secondary | ICD-10-CM | POA: Diagnosis not present

## 2019-11-22 DIAGNOSIS — Z7901 Long term (current) use of anticoagulants: Secondary | ICD-10-CM | POA: Diagnosis not present

## 2019-11-22 DIAGNOSIS — I4891 Unspecified atrial fibrillation: Secondary | ICD-10-CM | POA: Diagnosis not present

## 2019-12-06 ENCOUNTER — Encounter (INDEPENDENT_AMBULATORY_CARE_PROVIDER_SITE_OTHER): Payer: Medicare Other | Admitting: Ophthalmology

## 2019-12-06 ENCOUNTER — Encounter (INDEPENDENT_AMBULATORY_CARE_PROVIDER_SITE_OTHER): Payer: Self-pay | Admitting: Ophthalmology

## 2019-12-06 ENCOUNTER — Ambulatory Visit (INDEPENDENT_AMBULATORY_CARE_PROVIDER_SITE_OTHER): Payer: Medicare Other | Admitting: Ophthalmology

## 2019-12-06 ENCOUNTER — Other Ambulatory Visit: Payer: Self-pay

## 2019-12-06 DIAGNOSIS — H353133 Nonexudative age-related macular degeneration, bilateral, advanced atrophic without subfoveal involvement: Secondary | ICD-10-CM

## 2019-12-06 DIAGNOSIS — H353134 Nonexudative age-related macular degeneration, bilateral, advanced atrophic with subfoveal involvement: Secondary | ICD-10-CM | POA: Insufficient documentation

## 2019-12-06 DIAGNOSIS — H353212 Exudative age-related macular degeneration, right eye, with inactive choroidal neovascularization: Secondary | ICD-10-CM | POA: Diagnosis not present

## 2019-12-06 DIAGNOSIS — H353221 Exudative age-related macular degeneration, left eye, with active choroidal neovascularization: Secondary | ICD-10-CM

## 2019-12-06 MED ORDER — AFLIBERCEPT 2MG/0.05ML IZ SOLN FOR KALEIDOSCOPE
2.0000 mg | INTRAVITREAL | Status: AC | PRN
  Administered 2019-12-06: 2 mg via INTRAVITREAL

## 2019-12-06 NOTE — Patient Instructions (Signed)
Patient is notify the office promptly if new visual acuity decline or distortions develop

## 2019-12-06 NOTE — Progress Notes (Signed)
12/06/2019     CHIEF COMPLAINT Patient presents for Retina Follow Up   HISTORY OF PRESENT ILLNESS: Sylvia [name] is a 86 y.o. female who presents to the clinic today for:   HPI    Retina Follow Up    Patient presents with  Wet AMD.  In left eye.  Severity is moderate.  Duration of 7 weeks.  Since onset it is stable.  I, the attending physician,  performed the HPI with the patient and updated documentation appropriately.          Comments    7 Week Wet AMD f\u OS. Possible Eylea OS. OCT  Pt states vision is stable. Denies any complaints.       Last edited by Tilda Franco on 12/06/2019  1:28 PM. (History)      Referring physician: Leanna Battles, MD [place],  [place] [address]  HISTORICAL INFORMATION:   Selected notes from the MEDICAL RECORD NUMBER       CURRENT MEDICATIONS: Current Outpatient Medications (Ophthalmic Drugs)  Medication Sig  . hydroxypropyl methylcellulose / hypromellose (ISOPTO TEARS / GONIOVISC) 2.5 % ophthalmic solution Place 1 drop into both eyes as needed for dry eyes.   No current facility-administered medications for this visit. (Ophthalmic Drugs)   Current Outpatient Medications (Other)  Medication Sig  . acetaminophen (TYLENOL) 325 MG tablet Take by mouth every 6 (six) hours as needed for mild pain.  Marland Kitchen amLODipine (NORVASC) 2.5 MG tablet TAKE 2 TABLETS BY MOUTH IN THE MORNING AND 1 IN THE EVENING  . calcium citrate-vitamin D (CITRACAL+D) 315-200 MG-UNIT per tablet Take 1 tablet by mouth daily.    . diclofenac sodium (VOLTAREN) 1 % GEL Apply 4 g topically 4 (four) times daily.  Marland Kitchen doxycycline (VIBRA-TABS) 100 MG tablet Take 100 mg by mouth 2 (two) times daily.  . fish oil-omega-3 fatty acids 1000 MG capsule Take 1 g by mouth 2 (two) times daily.   . fluocinonide (LIDEX) 0.05 % external solution 2 (two) times daily. 1-2 GTTS IN EARS TWICE WEEKLY AS NEEDED   . levothyroxine (SYNTHROID, LEVOTHROID) 75 MCG tablet Take 75 mcg  by mouth daily.    Marland Kitchen losartan (COZAAR) 100 MG tablet Take 100 mg by mouth daily.    . meloxicam (MOBIC) 15 MG tablet Take 15 mg by mouth daily as needed for pain.  . metoprolol tartrate (LOPRESSOR) 50 MG tablet Take 50 mg by mouth 2 (two) times daily.  . Multiple Vitamins-Minerals (OCUVITE PRESERVISION PO) Take 1 tablet by mouth 2 (two) times daily.  . pantoprazole (PROTONIX) 40 MG tablet Take 40 mg by mouth 2 (two) times daily.  . polyethylene glycol powder (GLYCOLAX/MIRALAX) powder Take 17 g by mouth daily. PRN    . predniSONE (DELTASONE) 20 MG tablet   . promethazine (PHENERGAN) 25 MG tablet Take 25 mg by mouth 3 (three) times daily as needed.  . raloxifene (EVISTA) 60 MG tablet Take 60 mg by mouth daily.    . sucralfate (CARAFATE) 1 GM/10ML suspension Take 10 mLs (1 g total) by mouth 4 (four) times daily -  with meals and at bedtime. (Patient not taking: Reported on 08/30/2019)  . traMADol (ULTRAM) 50 MG tablet Take 50 mg by mouth every 8 (eight) hours as needed.  . warfarin (COUMADIN) 5 MG tablet TAKE 1/2 TO 1 TABLET BY MOUTH ONCE DAILY AS DIRECTED BY COUMADIN CLINIC   No current facility-administered medications for this visit. (Other)      REVIEW  OF SYSTEMS:    ALLERGIES Allergies  Allergen Reactions  . Metoclopramide Hcl     REACTION: nervous, climbs the wall  . Penicillins     REACTION: hives  . Pravastatin   . Sulfonamide Derivatives     REACTION: rash    PAST MEDICAL HISTORY Past Medical History:  Diagnosis Date  . Atrial fibrillation ([place])    ATRIAL FIBRILLATION S/P  PVI 6/11  . CAD (coronary artery disease)    a. 03/2002: cath showing 20% OM1 stenosis, EF at 65%, no WMA. No significant CAD.   Marland Kitchen GERD (gastroesophageal reflux disease)   . Hyperlipidemia   . Hypertension   . Macular degeneration   . SVT (supraventricular tachycardia) (HCC)    Past Surgical History:  Procedure Laterality Date  . ABLATION OF DYSRHYTHMIC FOCUS  09/2009   s/p PVI by JA  .  TOTAL ABDOMINAL HYSTERECTOMY      FAMILY HISTORY Family History  Problem Relation Age of Onset  . Stroke Other   . Coronary artery disease Other     SOCIAL HISTORY Social History   Tobacco Use  . Smoking status: Never Smoker  . Smokeless tobacco: Never Used  Vaping Use  . Vaping Use: Never used  Substance Use Topics  . Alcohol use: No  . Drug use: No         OPHTHALMIC EXAM: Base Eye Exam    Visual Acuity (Snellen - Linear)      Right Left   Dist cc CF @ 2' 20/50 -1   Dist ph cc NI 20/30 -2       Tonometry (Tonopen, 1:33 PM)      Right Left   Pressure 10 11       Pupils      Pupils Dark Light Shape React APD   Right PERRL 3 2 Round Brisk None   Left PERRL 3 2 Round Brisk None       Visual Fields (Counting fingers)      Left Right    Full Full       Neuro/Psych    Oriented x3: Yes   Mood/Affect: Normal       Dilation    Left eye: 1.0% Mydriacyl, 2.5% Phenylephrine @ 1:33 PM        Slit Lamp and Fundus Exam    External Exam      Right Left   External Normal Normal       Slit Lamp Exam      Right Left   Lids/Lashes Normal Normal   Conjunctiva/Sclera White and quiet White and quiet   Cornea Clear Clear   Anterior Chamber Deep and quiet Deep and quiet   Iris Round and reactive Round and reactive   Lens Posterior chamber intraocular lens Posterior chamber intraocular lens   Anterior Vitreous Normal Normal       Fundus Exam      Right Left   Posterior Vitreous  Posterior vitreous detachment   Disc  Normal   C/D Ratio  0.1   Macula  Age related macular degeneration, Atrophy into faz, advanced age related macular degeneration, Retinal pigment epithelial atrophy, Geographic atrophy   Vessels  Normal   Periphery  Normal CME.          IMAGING AND PROCEDURES  Imaging and Procedures for 12/06/19  OCT, Retina - OU - Both Eyes       Right Eye Quality was good. Scan locations included subfoveal. Central Foveal Thickness: 442.  Progression has been stable. Findings include subretinal scarring, cystoid macular edema.   Left Eye Quality was good. Scan locations included subfoveal. Central Foveal Thickness: 251. Progression has improved. Findings include central retinal atrophy, outer retinal atrophy, subretinal scarring, disciform scar.   Notes OS much improved on 8-week interval post Eylea.  We will repeat injection today and examination in 10 weeks       Intravitreal Injection, Pharmacologic Agent - OS - Left Eye       Time Out 12/06/2019. 2:13 PM. Confirmed correct patient, procedure, site, and patient consented.   Anesthesia Topical anesthesia was used. Anesthetic medications included Akten 3.5%.   Procedure Preparation included Tobramycin 0.3%, Ofloxacin , 10% betadine to eyelids, 5% betadine to ocular surface. A 30 gauge needle was used.   Injection:  2 mg aflibercept Alfonse Flavors) SOLN   NDC: [id number], Lot: [id number]   Route: Intravitreal, Site: Left Eye, Waste: 0 mg  Post-op Post injection exam found visual acuity of at least counting fingers. The patient tolerated the procedure well. There were no complications. The patient received written and verbal post procedure care education. Post injection medications were not given.                 ASSESSMENT/PLAN:  Exudative age-related macular degeneration of left eye with active choroidal neovascularization (HCC) OS, improved with sparing of the central fovea with the use of intravitreal Eylea, at 8-week interval today will repeat injection today  and examination in 10 weeks      ICD-10-CM   1. Exudative age-related macular degeneration of left eye with active choroidal neovascularization (HCC)  H35.3221 OCT, Retina - OU - Both Eyes    Intravitreal Injection, Pharmacologic Agent - OS - Left Eye    aflibercept (EYLEA) SOLN 2 mg    1.  Repeat injection OS today with intravitreal Eylea and repeat examination in 10 weeks with possible  Eylea  2.  Dilate OU next in 10 weeks  3.  Ophthalmic Meds Ordered this visit:  Meds ordered this encounter  Medications  . aflibercept (EYLEA) SOLN 2 mg       Return in about 10 weeks (around 02/14/2020) for DILATE OU, EYLEA OCT, OS.  There are no Patient Instructions on file for this visit.   Explained the diagnoses, plan, and follow up with the patient and they expressed understanding.  Patient expressed understanding of the importance of proper follow up care.   Clent Demark Zoey Gilkeson M.D. Diseases & Surgery of the Retina and Vitreous [facility] [place] 12/06/19     Abbreviations: M myopia (nearsighted); A astigmatism; H hyperopia (farsighted); P presbyopia; Mrx spectacle prescription;  CTL contact lenses; OD right eye; OS left eye; OU both eyes  XT exotropia; ET esotropia; PEK punctate epithelial keratitis; PEE punctate epithelial erosions; DES dry eye syndrome; MGD meibomian gland dysfunction; ATs artificial tears; PFAT's preservative free artificial tears; [place] nuclear sclerotic cataract; PSC posterior subcapsular cataract; ERM epi-retinal membrane; PVD posterior vitreous detachment; RD retinal detachment; DM diabetes mellitus; DR diabetic retinopathy; NPDR non-proliferative diabetic retinopathy; PDR proliferative diabetic retinopathy; CSME clinically significant macular edema; DME diabetic macular edema; dbh dot blot hemorrhages; CWS cotton wool spot; POAG primary open angle glaucoma; C/D cup-to-disc ratio; HVF humphrey visual field; GVF goldmann visual field; OCT optical coherence tomography; IOP intraocular pressure; BRVO Branch retinal vein occlusion; CRVO central retinal vein occlusion; CRAO central retinal artery occlusion; BRAO branch retinal artery occlusion; RT retinal tear; SB scleral buckle; PPV pars plana vitrectomy; VH Vitreous  hemorrhage; PRP panretinal laser photocoagulation; IVK intravitreal kenalog; VMT vitreomacular traction; MH Macular hole;  NVD  neovascularization of the disc; NVE neovascularization elsewhere; AREDS age related eye disease study; ARMD age related macular degeneration; POAG primary open angle glaucoma; EBMD epithelial/anterior basement membrane dystrophy; ACIOL anterior chamber intraocular lens; IOL intraocular lens; PCIOL posterior chamber intraocular lens; Phaco/IOL phacoemulsification with intraocular lens placement; [place] photorefractive keratectomy; LASIK laser assisted in situ keratomileusis; HTN hypertension; DM diabetes mellitus; COPD chronic obstructive pulmonary disease

## 2019-12-06 NOTE — Assessment & Plan Note (Signed)

## 2019-12-06 NOTE — Assessment & Plan Note (Addendum)
OS, improved with sparing of the central fovea with the use of intravitreal Eylea, at 8-week interval today will repeat injection today  and examination in 10 weeks

## 2019-12-06 NOTE — Assessment & Plan Note (Signed)
Subfoveal disciform scar, not treatable OD

## 2019-12-13 ENCOUNTER — Encounter (INDEPENDENT_AMBULATORY_CARE_PROVIDER_SITE_OTHER): Payer: Medicare Other | Admitting: Ophthalmology

## 2019-12-20 DIAGNOSIS — I4891 Unspecified atrial fibrillation: Secondary | ICD-10-CM | POA: Diagnosis not present

## 2019-12-20 DIAGNOSIS — Z7901 Long term (current) use of anticoagulants: Secondary | ICD-10-CM | POA: Diagnosis not present

## 2019-12-27 ENCOUNTER — Other Ambulatory Visit: Payer: Self-pay | Admitting: Cardiology

## 2019-12-28 ENCOUNTER — Other Ambulatory Visit: Payer: Self-pay

## 2019-12-28 ENCOUNTER — Encounter: Payer: Self-pay | Admitting: Orthopaedic Surgery

## 2019-12-28 ENCOUNTER — Ambulatory Visit (INDEPENDENT_AMBULATORY_CARE_PROVIDER_SITE_OTHER): Payer: Medicare Other | Admitting: Orthopaedic Surgery

## 2019-12-28 VITALS — Ht 61.5 in | Wt 149.0 lb

## 2019-12-28 DIAGNOSIS — M1711 Unilateral primary osteoarthritis, right knee: Secondary | ICD-10-CM

## 2019-12-28 MED ORDER — METHYLPREDNISOLONE ACETATE 40 MG/ML IJ SUSP
80.0000 mg | INTRAMUSCULAR | Status: AC | PRN
  Administered 2019-12-28: 80 mg via INTRA_ARTICULAR

## 2019-12-28 MED ORDER — BUPIVACAINE HCL 0.5 % IJ SOLN
2.0000 mL | INTRAMUSCULAR | Status: AC | PRN
  Administered 2019-12-28: 2 mL via INTRA_ARTICULAR

## 2019-12-28 MED ORDER — LIDOCAINE HCL 1 % IJ SOLN
2.0000 mL | INTRAMUSCULAR | Status: AC | PRN
  Administered 2019-12-28: 2 mL

## 2019-12-28 NOTE — Telephone Encounter (Signed)
Please review refill request, patient last had Warfarin frilled 07/11/19, it appears her last PT/INR was 04/19/19. KW

## 2019-12-28 NOTE — Progress Notes (Signed)
Office Visit Note   Patient: Sylvia [name]           Date of Birth: 11/25/1932           MRN: [id number] Visit Date: 12/28/2019              Requested by: Leanna Battles, MD [place],  [place] [address] PCP: Leanna Battles, MD   Assessment & Plan: Visit Diagnoses:  1. Unilateral primary osteoarthritis, right knee     Plan: Recurrent symptoms of osteoarthritis both knees.  Having more trouble with the right knee.  Will inject with cortisone and see her back in 2 weeks to inject the left knee  Follow-Up Instructions: Return in about 2 weeks (around 01/11/2020).   Orders:  No orders of the defined types were placed in this encounter.  No orders of the defined types were placed in this encounter.     Procedures: Large Joint Inj: R knee on 12/28/2019 3:33 PM Indications: pain and diagnostic evaluation Details: 25 G 1.5 in needle, anteromedial approach  Arthrogram: No  Medications: 2 mL lidocaine 1 %; 2 mL bupivacaine 0.5 %; 80 mg methylPREDNISolone acetate 40 MG/ML Procedure, treatment alternatives, risks and benefits explained, specific risks discussed. Consent was given by the patient. Immediately prior to procedure a time out was called to verify the correct patient, procedure, equipment, support staff and site/side marked as required. Patient was prepped and draped in the usual sterile fashion.       Clinical Data: No additional findings.   Subjective: Chief Complaint  Patient presents with  . Right Knee - Pain  . Left Knee - Pain  Patient presents today for chronic bilateral knee pain. She was here on May 26,2020 and received a right knee cortisone injection. She states that the injection helped for about 1months. She is wanting to get a cortisone injection in her right knee today. Her right knee hurts more than the left. She does not take anything for pain. She has not been experiencing any swelling, grinding, or giving way.  Has had prior films  demonstrating degenerative changes in all 3 compartments of both knees  HPI  Review of Systems   Objective: Vital Signs: Ht 5' 1.5" (1.562 m)   Wt 149 lb (67.6 kg)   BMI 27.70 kg/m   Physical Exam Constitutional:      Appearance: She is well-developed.  Eyes:     Pupils: Pupils are equal, round, and reactive to light.  Pulmonary:     Effort: Pulmonary effort is normal.  Skin:    General: Skin is warm and dry.  Neurological:     Mental Status: She is alert and oriented to person, place, and time.  Psychiatric:        Behavior: Behavior normal.     Ortho Exam no effusion right knee mostly lateral joint pain.  Full extension flexed over 100 degrees without instability no popliteal mass.  No calf pain  Specialty Comments:  No specialty comments available.  Imaging: No results found.   PMFS History: Patient Active Problem List   Diagnosis Date Noted  . Advanced nonexudative age-related macular degeneration of both eyes without subfoveal involvement 12/06/2019  . Exudative age-related macular degeneration of left eye with active choroidal neovascularization ([place]) 07/21/2019  . Exudative age-related macular degeneration of right eye with inactive choroidal neovascularization ([place]) 07/21/2019  . Cystoid macular edema of right eye 07/21/2019  . Posterior vitreous detachment of left eye 07/21/2019  . Cervical  spondylosis without myelopathy 01/18/2019  . Spondylolisthesis of cervical region 01/18/2019  . Obstructive sleep apnea treated with continuous positive airway pressure (CPAP) 06/02/2018  . Unilateral primary osteoarthritis, right knee 03/02/2017  . Unilateral primary osteoarthritis, right knee 03/02/2017  . Long term (current) use of anticoagulants 07/26/2010  . FATIGUE 05/23/2010  . HYPERCHOLESTEROLEMIA, PURE 02/13/2008  . HYPERTENSION, BENIGN 02/13/2008  . SVT/ PSVT/ PAT 02/13/2008  . ATRIAL FIBRILLATION 02/13/2008  . GERD 02/13/2008  . PALPITATIONS 02/13/2008     Past Medical History:  Diagnosis Date  . Atrial fibrillation ([place])    ATRIAL FIBRILLATION S/P  PVI 6/11  . CAD (coronary artery disease)    a. 03/2002: cath showing 20% OM1 stenosis, EF at 65%, no WMA. No significant CAD.   Marland Kitchen GERD (gastroesophageal reflux disease)   . Hyperlipidemia   . Hypertension   . Macular degeneration   . SVT (supraventricular tachycardia) (HCC)     Family History  Problem Relation Age of Onset  . Stroke Other   . Coronary artery disease Other     Past Surgical History:  Procedure Laterality Date  . ABLATION OF DYSRHYTHMIC FOCUS  09/2009   s/p PVI by JA  . TOTAL ABDOMINAL HYSTERECTOMY     Social History   Occupational History  . Not on file  Tobacco Use  . Smoking status: Never Smoker  . Smokeless tobacco: Never Used  Vaping Use  . Vaping Use: Never used  Substance and Sexual Activity  . Alcohol use: No  . Drug use: No  . Sexual activity: Not on file

## 2019-12-30 ENCOUNTER — Other Ambulatory Visit: Payer: Self-pay | Admitting: Cardiology

## 2020-01-05 ENCOUNTER — Other Ambulatory Visit: Payer: Self-pay | Admitting: Cardiology

## 2020-01-05 NOTE — Telephone Encounter (Signed)
Please review for refill, Thanks !  

## 2020-01-05 NOTE — Telephone Encounter (Signed)
Per last note on 05/03/19, patient no longer managed by Coumadin clinic here and is followed by [facility]

## 2020-01-11 ENCOUNTER — Ambulatory Visit (INDEPENDENT_AMBULATORY_CARE_PROVIDER_SITE_OTHER): Payer: Medicare Other | Admitting: Orthopaedic Surgery

## 2020-01-11 ENCOUNTER — Other Ambulatory Visit: Payer: Self-pay

## 2020-01-11 ENCOUNTER — Encounter: Payer: Self-pay | Admitting: Orthopaedic Surgery

## 2020-01-11 DIAGNOSIS — M170 Bilateral primary osteoarthritis of knee: Secondary | ICD-10-CM | POA: Diagnosis not present

## 2020-01-11 MED ORDER — METHYLPREDNISOLONE ACETATE 40 MG/ML IJ SUSP
80.0000 mg | INTRAMUSCULAR | Status: AC | PRN
  Administered 2020-01-11: 80 mg via INTRA_ARTICULAR

## 2020-01-11 MED ORDER — LIDOCAINE HCL 1 % IJ SOLN
2.0000 mL | INTRAMUSCULAR | Status: AC | PRN
  Administered 2020-01-11: 2 mL

## 2020-01-11 MED ORDER — BUPIVACAINE HCL 0.5 % IJ SOLN
2.0000 mL | INTRAMUSCULAR | Status: AC | PRN
  Administered 2020-01-11: 2 mL via INTRA_ARTICULAR

## 2020-01-11 NOTE — Progress Notes (Signed)
Office Visit Note   Patient: Sylvia [name]           Date of Birth: [birth year]           MRN: [id number] Visit Date: 01/11/2020              Requested by: Sylvia Battles, MD [place],  [place] [id number] PCP: Sylvia Battles, MD   Assessment & Plan: Visit Diagnoses:  1. Bilateral primary osteoarthritis of knee     Plan: Sylvia [name] relates the cortisone injection right knee made a significant difference in her discomfort and would like to proceed with a cortisone injection of the left knee.  We will perform today and see her back as needed  Follow-Up Instructions: Return if symptoms worsen or fail to improve.   Orders:  Orders Placed This Encounter  Procedures  . Large Joint Inj: L knee   No orders of the defined types were placed in this encounter.     Procedures: Large Joint Inj: L knee on 01/11/2020 3:14 PM Indications: pain and diagnostic evaluation Details: 25 G 1.5 in needle, anteromedial approach  Arthrogram: No  Medications: 2 mL lidocaine 1 %; 2 mL bupivacaine 0.5 %; 80 mg methylPREDNISolone acetate 40 MG/ML Procedure, treatment alternatives, risks and benefits explained, specific risks discussed. Consent was given by the patient. Patient was prepped and draped in the usual sterile fashion.       Clinical Data: No additional findings.   Subjective: Chief Complaint  Patient presents with  . Right Knee - Follow-up  . Left Knee - Follow-up  Patient presents today for a two week follow up on her knees. She received a cortisone injection in her right knee on 12/28/2019. She states the injection has given her a lot of relief. She is wanting to get a cortisone injection in her left knee today. She takes Tylenol as needed.   HPI  Review of Systems   Objective: Vital Signs: Ht 5' 1.5" (1.562 m)   Wt 149 lb (67.6 kg)   BMI 27.70 kg/m   Physical Exam Constitutional:      Appearance: She is well-developed.  Eyes:     Pupils:  Pupils are equal, round, and reactive to light.  Pulmonary:     Effort: Pulmonary effort is normal.  Skin:    General: Skin is warm and dry.  Neurological:     Mental Status: She is alert and oriented to person, place, and time.  Psychiatric:        Behavior: Behavior normal.     Ortho Exam left knee was not hot red warm or swollen.  Had some both medial and lateral joint pain.  No obvious effusion.  Full extension of flexed over 100 degrees without instability Specialty Comments:  No specialty comments available.  Imaging: No results found.   PMFS History: Patient Active Problem List   Diagnosis Date Noted  . Bilateral primary osteoarthritis of knee 01/11/2020  . Advanced nonexudative age-related macular degeneration of both eyes without subfoveal involvement 12/06/2019  . Exudative age-related macular degeneration of left eye with active choroidal neovascularization ([place]) 07/21/2019  . Exudative age-related macular degeneration of right eye with inactive choroidal neovascularization ([place]) 07/21/2019  . Cystoid macular edema of right eye 07/21/2019  . Posterior vitreous detachment of left eye 07/21/2019  . Cervical spondylosis without myelopathy 01/18/2019  . Spondylolisthesis of cervical region 01/18/2019  . Obstructive sleep apnea treated with continuous positive airway pressure (CPAP) 06/02/2018  . Unilateral  primary osteoarthritis, right knee 03/02/2017  . Unilateral primary osteoarthritis, right knee 03/02/2017  . Long term (current) use of anticoagulants 07/26/2010  . FATIGUE 05/23/2010  . HYPERCHOLESTEROLEMIA, PURE 02/13/2008  . HYPERTENSION, BENIGN 02/13/2008  . SVT/ PSVT/ PAT 02/13/2008  . ATRIAL FIBRILLATION 02/13/2008  . GERD 02/13/2008  . PALPITATIONS 02/13/2008   Past Medical History:  Diagnosis Date  . Atrial fibrillation ([place])    ATRIAL FIBRILLATION S/P  PVI 6/11  . CAD (coronary artery disease)    a. 03/2002: cath showing 20% OM1 stenosis, EF at 65%,  no WMA. No significant CAD.   Marland Kitchen GERD (gastroesophageal reflux disease)   . Hyperlipidemia   . Hypertension   . Macular degeneration   . SVT (supraventricular tachycardia) (HCC)     Family History  Problem Relation Age of Onset  . Stroke Other   . Coronary artery disease Other     Past Surgical History:  Procedure Laterality Date  . ABLATION OF DYSRHYTHMIC FOCUS  09/2009   s/p PVI by JA  . TOTAL ABDOMINAL HYSTERECTOMY     Social History   Occupational History  . Not on file  Tobacco Use  . Smoking status: Never Smoker  . Smokeless tobacco: Never Used  Vaping Use  . Vaping Use: Never used  Substance and Sexual Activity  . Alcohol use: No  . Drug use: No  . Sexual activity: Not on file

## 2020-01-17 DIAGNOSIS — Z7901 Long term (current) use of anticoagulants: Secondary | ICD-10-CM | POA: Diagnosis not present

## 2020-01-17 DIAGNOSIS — I4891 Unspecified atrial fibrillation: Secondary | ICD-10-CM | POA: Diagnosis not present

## 2020-01-26 DIAGNOSIS — Z7901 Long term (current) use of anticoagulants: Secondary | ICD-10-CM | POA: Diagnosis not present

## 2020-01-26 DIAGNOSIS — I4891 Unspecified atrial fibrillation: Secondary | ICD-10-CM | POA: Diagnosis not present

## 2020-02-05 IMAGING — MR MR CERVICAL SPINE W/O CM
4 of 5 series · 30 of 48 positions shown · non-contrast
Comparison: None.

CLINICAL DATA: Right-sided neck and shoulder pain over the last 2
weeks.

EXAM:
MRI CERVICAL SPINE WITHOUT CONTRAST
TECHNIQUE: Multiplanar, multisequence MR imaging of the cervical spine was
performed. No intravenous contrast was administered.

[Series 2: T2 · sagittal · 3.0mm · 0.66mm/px · 8 of 15 slices shown (1 of 2)]
[im 1/15]
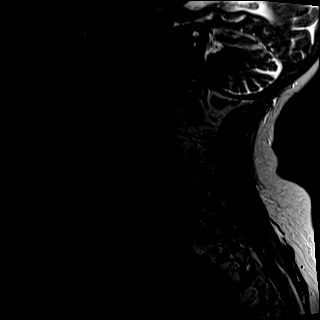
[im 3/15]
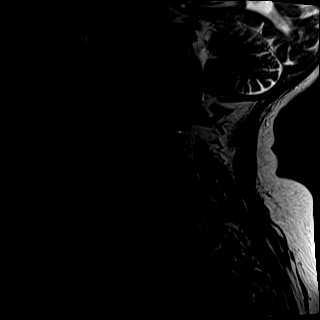
[im 5/15]
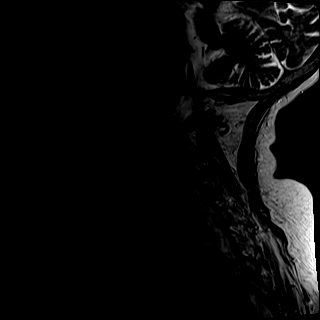
[im 7/15]
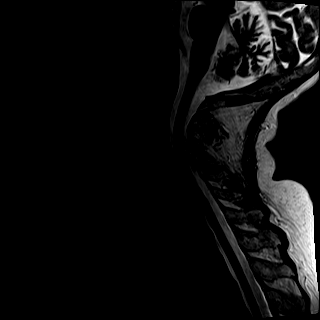
[im 9/15]
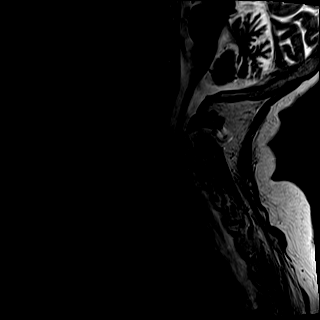
[im 11/15]
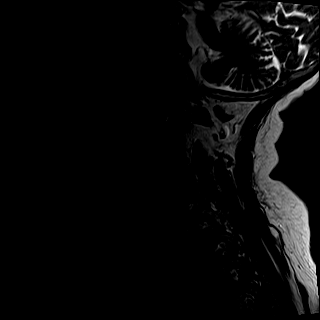
[im 13/15]
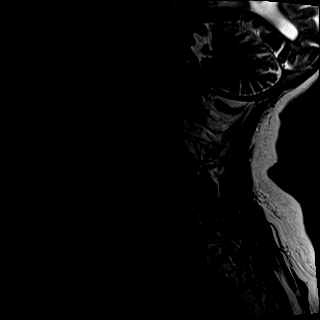
[im 15/15]
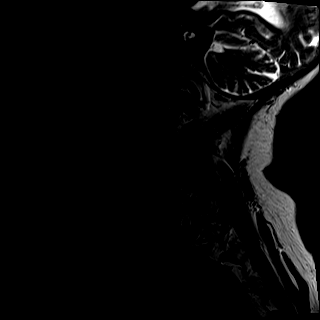

[Series 3: STIR · sagittal · 3.0mm · 0.41mm/px · 6 of 15 slices shown]
[im 1/15]
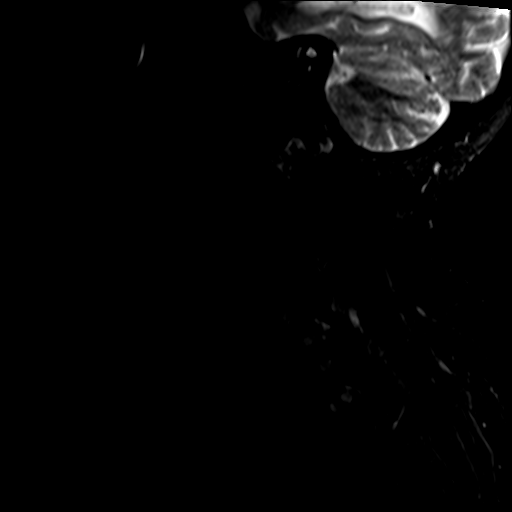
[im 3/15]
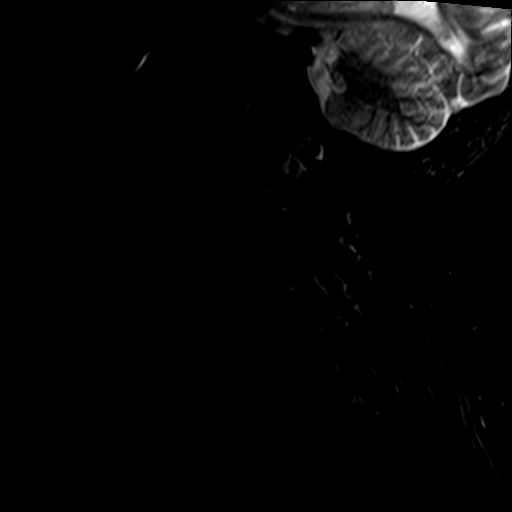
[im 5/15]
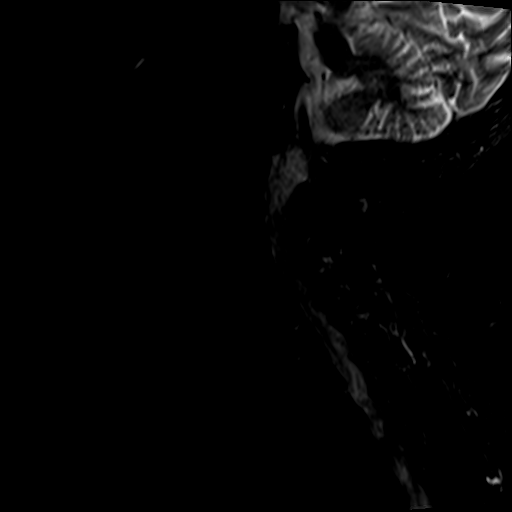
[im 8/15]
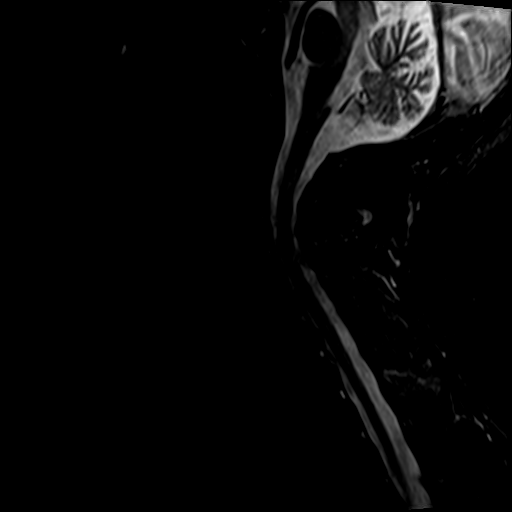
[im 10/15]
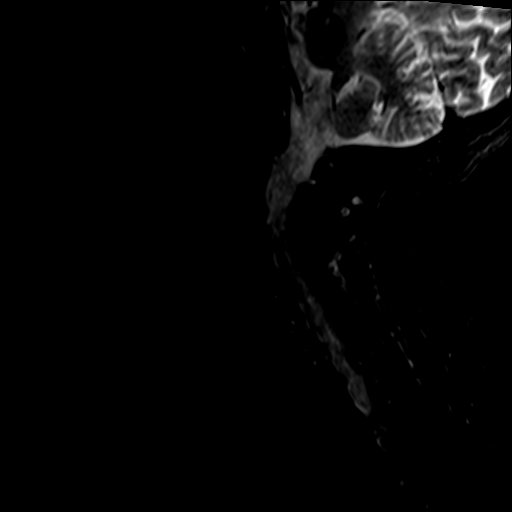
[im 12/15]
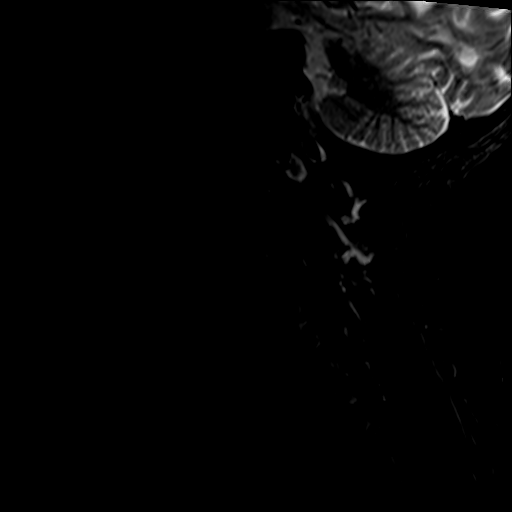

[Series 4: T1 · sagittal · 3.0mm · 0.41mm/px · 7 of 15 slices shown]
[im 1/15]
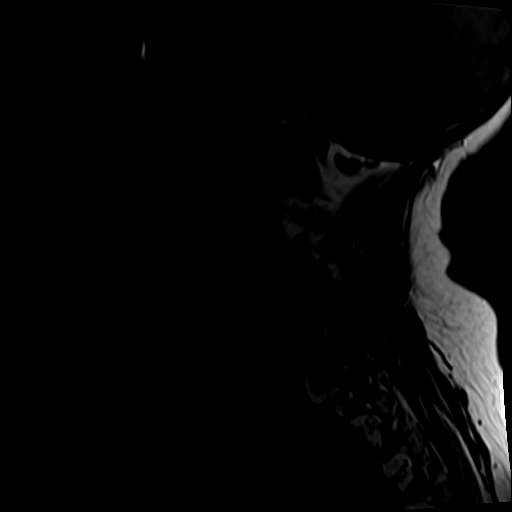
[im 3/15]
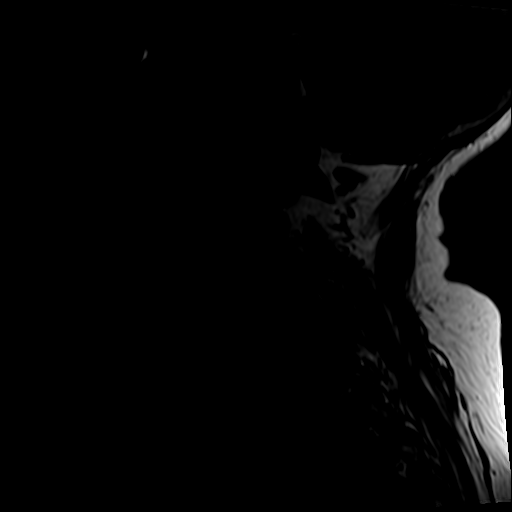
[im 5/15]
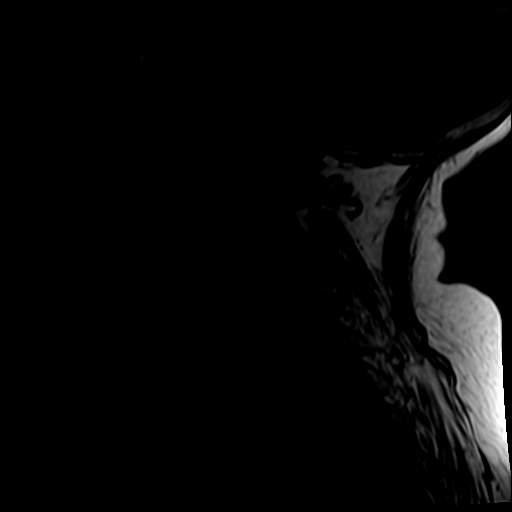
[im 8/15]
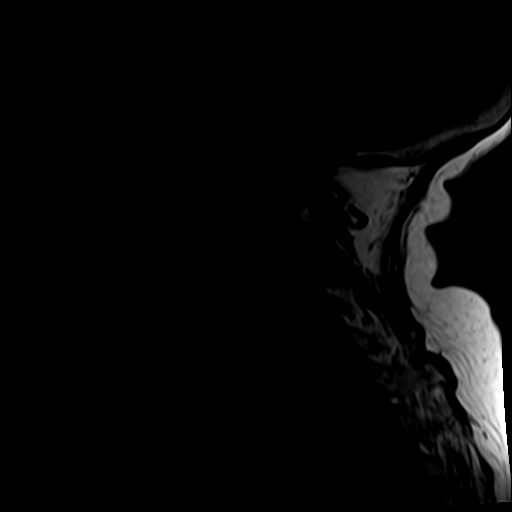
[im 10/15]
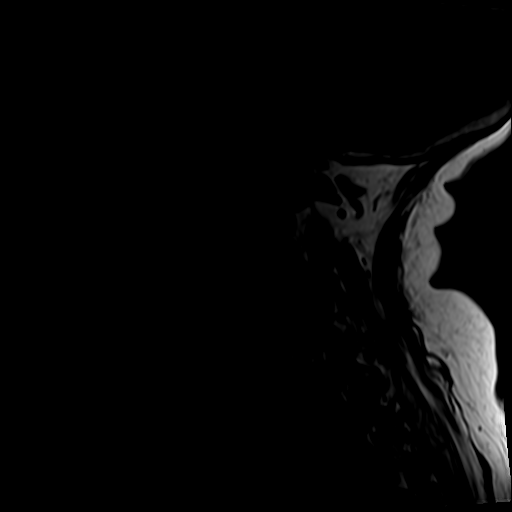
[im 12/15]
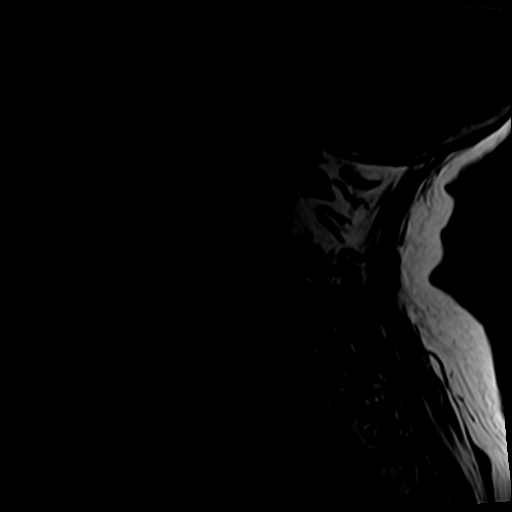
[im 15/15]
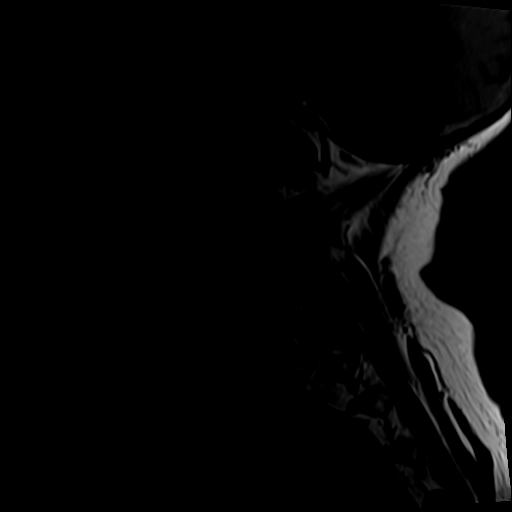

[Series 6: T2 · axial · 3.0mm · 0.70mm/px · z∈[-94,-8]mm · 9 of 26 slices shown (2 of 2)]
[im 1/26]
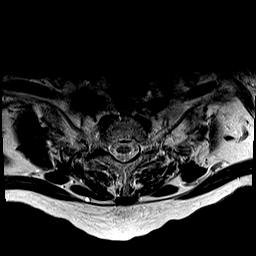
[im 5/26]
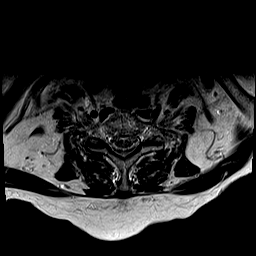
[im 9/26]
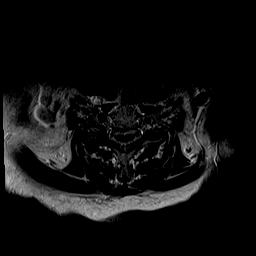
[im 11/26]
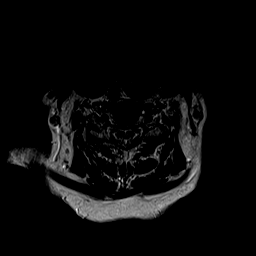
[im 13/26]
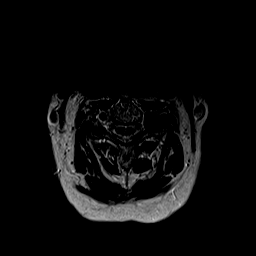
[im 15/26]
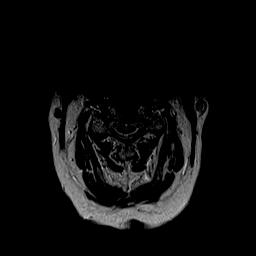
[im 17/26]
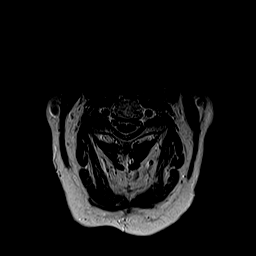
[im 21/26]
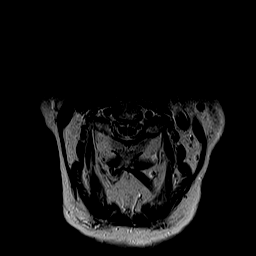
[im 26/26]
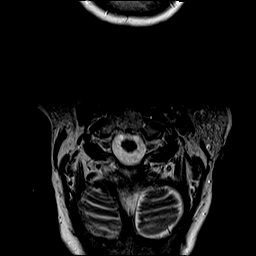

[30 of 48 positions shown; findings below may reference images not displayed]

FINDINGS: Alignment: 1 or 2 mm anterolisthesis C5-6, C6-7 and C7-T1.

Vertebrae: No fracture or primary bone lesion.

Cord: No cord compression or primary cord lesion.

Posterior Fossa, vertebral arteries, paraspinal tissues: Negative

Disc levels:

No significant finding from the foramen magnum through C4-5.

C5-6: 1 or 2 mm of anterolisthesis. Minimal bulging of the disc. No
canal or foraminal stenosis. No edematous facet arthropathy.

C6-7: 1 or 2 mm of anterolisthesis. Mild bulging of the disc. No
compressive canal or foraminal narrowing. No edematous facet
arthropathy.

C7-T1: 1 or 2 mm of anterolisthesis. Mild bulging of the disc. No
compressive canal or foraminal narrowing. No edematous facet
arthropathy.
IMPRESSION: Essentially negative study for a person of this age. Mild
non-compressive disc bulges at C5-6, C6-7 and C7-T1. One or 2 mm of
degenerative anterolisthesis at those levels. The patient does not
appear to have edematous facet arthropathy as an explanation.

## 2020-02-14 ENCOUNTER — Ambulatory Visit (INDEPENDENT_AMBULATORY_CARE_PROVIDER_SITE_OTHER): Payer: Medicare Other | Admitting: Ophthalmology

## 2020-02-14 ENCOUNTER — Other Ambulatory Visit: Payer: Self-pay

## 2020-02-14 ENCOUNTER — Encounter (INDEPENDENT_AMBULATORY_CARE_PROVIDER_SITE_OTHER): Payer: Self-pay | Admitting: Ophthalmology

## 2020-02-14 DIAGNOSIS — H353212 Exudative age-related macular degeneration, right eye, with inactive choroidal neovascularization: Secondary | ICD-10-CM | POA: Diagnosis not present

## 2020-02-14 DIAGNOSIS — H43812 Vitreous degeneration, left eye: Secondary | ICD-10-CM

## 2020-02-14 DIAGNOSIS — H353134 Nonexudative age-related macular degeneration, bilateral, advanced atrophic with subfoveal involvement: Secondary | ICD-10-CM | POA: Diagnosis not present

## 2020-02-14 DIAGNOSIS — H353221 Exudative age-related macular degeneration, left eye, with active choroidal neovascularization: Secondary | ICD-10-CM | POA: Diagnosis not present

## 2020-02-14 MED ORDER — AFLIBERCEPT 2MG/0.05ML IZ SOLN FOR KALEIDOSCOPE
2.0000 mg | INTRAVITREAL | Status: AC | PRN
  Administered 2020-02-14: 2 mg via INTRAVITREAL

## 2020-02-14 NOTE — Assessment & Plan Note (Signed)
New and recurrent CME overlying central foveal atrophy of dry AMD.,  Wet AMD active at 10-week interval.  Will repeat intravitreal Eylea today and follow-up in 7 weeks

## 2020-02-14 NOTE — Assessment & Plan Note (Signed)
Large irregular disciform scar subfoveal.

## 2020-02-14 NOTE — Assessment & Plan Note (Signed)

## 2020-02-14 NOTE — Assessment & Plan Note (Signed)
Accounts for acuity OD 

## 2020-02-14 NOTE — Progress Notes (Signed)
02/14/2020     CHIEF COMPLAINT Patient presents for Retina Follow Up   HISTORY OF PRESENT ILLNESS: Sylvia [name] is a 86 y.o. female who presents to the clinic today for:   HPI    Retina Follow Up    Patient presents with  Wet AMD.  In left eye.  Severity is moderate.  Duration of 10 weeks.  Since onset it is stable.  I, the attending physician,  performed the HPI with the patient and updated documentation appropriately.          Comments    10 Week f\u OU. Possible Eylea OS. OCT  Pt c/o a decrease in vision. Pt states she has noticed a black spot in the right side vision of OD. Pt states she is unable to read with OS.       Last edited by Tilda Franco on 02/14/2020  1:03 PM. (History)      Referring physician: Leanna Battles, MD [place],  [place] [address]  HISTORICAL INFORMATION:   Selected notes from the MEDICAL RECORD NUMBER       CURRENT MEDICATIONS: Current Outpatient Medications (Ophthalmic Drugs)  Medication Sig  . hydroxypropyl methylcellulose / hypromellose (ISOPTO TEARS / GONIOVISC) 2.5 % ophthalmic solution Place 1 drop into both eyes as needed for dry eyes.   No current facility-administered medications for this visit. (Ophthalmic Drugs)   Current Outpatient Medications (Other)  Medication Sig  . acetaminophen (TYLENOL) 325 MG tablet Take by mouth every 6 (six) hours as needed for mild pain.  Marland Kitchen amLODipine (NORVASC) 2.5 MG tablet TAKE 2 TABLETS BY MOUTH IN THE MORNING AND 1 IN THE EVENING  . calcium citrate-vitamin D (CITRACAL+D) 315-200 MG-UNIT per tablet Take 1 tablet by mouth daily.    . diclofenac sodium (VOLTAREN) 1 % GEL Apply 4 g topically 4 (four) times daily.  Marland Kitchen doxycycline (VIBRA-TABS) 100 MG tablet Take 100 mg by mouth 2 (two) times daily.  . fish oil-omega-3 fatty acids 1000 MG capsule Take 1 g by mouth 2 (two) times daily.   . fluocinonide (LIDEX) 0.05 % external solution 2 (two) times daily. 1-2 GTTS IN EARS TWICE  WEEKLY AS NEEDED   . levothyroxine (SYNTHROID, LEVOTHROID) 75 MCG tablet Take 75 mcg by mouth daily.    Marland Kitchen losartan (COZAAR) 100 MG tablet Take 100 mg by mouth daily.    . meloxicam (MOBIC) 15 MG tablet Take 15 mg by mouth daily as needed for pain.  . metoprolol tartrate (LOPRESSOR) 50 MG tablet Take 50 mg by mouth 2 (two) times daily.  . Multiple Vitamins-Minerals (OCUVITE PRESERVISION PO) Take 1 tablet by mouth 2 (two) times daily.  . pantoprazole (PROTONIX) 40 MG tablet Take 40 mg by mouth 2 (two) times daily.  . polyethylene glycol powder (GLYCOLAX/MIRALAX) powder Take 17 g by mouth daily. PRN    . predniSONE (DELTASONE) 20 MG tablet   . promethazine (PHENERGAN) 25 MG tablet Take 25 mg by mouth 3 (three) times daily as needed.  . raloxifene (EVISTA) 60 MG tablet Take 60 mg by mouth daily.    . sucralfate (CARAFATE) 1 GM/10ML suspension Take 10 mLs (1 g total) by mouth 4 (four) times daily -  with meals and at bedtime.  . traMADol (ULTRAM) 50 MG tablet Take 50 mg by mouth every 8 (eight) hours as needed.  . warfarin (COUMADIN) 5 MG tablet TAKE 1/2 TO 1 TABLET BY MOUTH ONCE DAILY AS DIRECTED BY COUMADIN CLINIC  No current facility-administered medications for this visit. (Other)      REVIEW OF SYSTEMS:    ALLERGIES Allergies  Allergen Reactions  . Metoclopramide Hcl     REACTION: nervous, climbs the wall  . Penicillins     REACTION: hives  . Pravastatin   . Sulfonamide Derivatives     REACTION: rash    PAST MEDICAL HISTORY Past Medical History:  Diagnosis Date  . Atrial fibrillation ([place])    ATRIAL FIBRILLATION S/P  PVI 6/11  . CAD (coronary artery disease)    a. 03/2002: cath showing 20% OM1 stenosis, EF at 65%, no WMA. No significant CAD.   Marland Kitchen GERD (gastroesophageal reflux disease)   . Hyperlipidemia   . Hypertension   . Macular degeneration   . SVT (supraventricular tachycardia) (HCC)    Past Surgical History:  Procedure Laterality Date  . ABLATION OF  DYSRHYTHMIC FOCUS  09/2009   s/p PVI by JA  . TOTAL ABDOMINAL HYSTERECTOMY      FAMILY HISTORY Family History  Problem Relation Age of Onset  . Stroke Other   . Coronary artery disease Other     SOCIAL HISTORY Social History   Tobacco Use  . Smoking status: Never Smoker  . Smokeless tobacco: Never Used  Vaping Use  . Vaping Use: Never used  Substance Use Topics  . Alcohol use: No  . Drug use: No         OPHTHALMIC EXAM:  Base Eye Exam    Visual Acuity (Snellen - Linear)      Right Left   Dist cc HM 20/60 -2   Dist ph cc NI NI   Correction: Glasses       Tonometry (Tonopen, 1:07 PM)      Right Left   Pressure 7 8       Pupils      Pupils Dark Light Shape React APD   Right PERRL 2 2 Round Minimal None   Left PERRL 2 2 Round Minimal None       Visual Fields (Counting fingers)      Left Right    Full Full       Neuro/Psych    Oriented x3: Yes   Mood/Affect: Normal       Dilation    Both eyes: 1.0% Mydriacyl, 2.5% Phenylephrine @ 1:07 PM        Slit Lamp and Fundus Exam    External Exam      Right Left   External Normal Normal       Slit Lamp Exam      Right Left   Lids/Lashes Normal Normal   Conjunctiva/Sclera White and quiet White and quiet   Cornea Clear Clear   Anterior Chamber Deep and quiet Deep and quiet   Iris Round and reactive Round and reactive   Lens Posterior chamber intraocular lens Posterior chamber intraocular lens   Anterior Vitreous Normal Normal       Fundus Exam      Right Left   Posterior Vitreous Posterior vitreous detachment Posterior vitreous detachment   Disc Normal Normal   C/D Ratio 0.1 0.1   Macula Disciform scar, Cystoid macular edema, Advanced age related macular degeneration, Retinal pigment epithelial atrophy Age related macular degeneration, Atrophy into faz, advanced age related macular degeneration, Retinal pigment epithelial atrophy, Geographic atrophy in the FAZ   Vessels Normal Normal   Periphery  Normal Normal          IMAGING AND PROCEDURES  Imaging and Procedures for 02/14/20  OCT, Retina - OU - Both Eyes       Right Eye Quality was good. Scan locations included subfoveal. Central Foveal Thickness: 766. Progression has been stable. Findings include disciform scar, cystoid macular edema, choroidal neovascular membrane.   Left Eye Quality was good. Scan locations included subfoveal. Central Foveal Thickness: 429. Progression has worsened. Findings include cystoid macular edema.   Notes CME OS, much worse at 10 week f/u interval , will repeat Eylea OS now and examine in 7 weeks.       Intravitreal Injection, Pharmacologic Agent - OS - Left Eye       Time Out 02/14/2020. 1:51 PM. Confirmed correct patient, procedure, site, and patient consented.   Anesthesia Topical anesthesia was used. Anesthetic medications included [place] 3.5%.   Procedure Preparation included Tobramycin 0.3%, Ofloxacin , 10% betadine to eyelids, 5% betadine to ocular surface. A 30 gauge needle was used.   Injection:  2 mg aflibercept Alfonse Flavors) SOLN   NDC: [id number], Lot: [id number]   Route: Intravitreal, Site: Left Eye, Waste: 0 mg  Post-op Post injection exam found visual acuity of at least counting fingers. The patient tolerated the procedure well. There were no complications. The patient received written and verbal post procedure care education. Post injection medications were not given.                 ASSESSMENT/PLAN:  Exudative age-related macular degeneration of left eye with active choroidal neovascularization (HCC) New and recurrent CME overlying central foveal atrophy of dry AMD.,  Wet AMD active at 10-week interval.  Will repeat intravitreal Eylea today and follow-up in 7 weeks  Advanced nonexudative age-related macular degeneration of both eyes with subfoveal involvement Accounts for acuity OD  Exudative age-related macular degeneration of right eye with inactive  choroidal neovascularization (HCC) Large irregular disciform scar subfoveal.  Posterior vitreous detachment of left eye   The nature of posterior vitreous detachment was discussed with the patient as well as its physiology, its age prevalence, and its possible implication regarding retinal breaks and detachment.  An informational brochure was given to the patient.  All the patient's questions were answered.  The patient was asked to return if new or different flashes or floaters develops.   Patient was instructed to contact office immediately if any changes were noticed. I explained to the patient that vitreous inside the eye is similar to jello inside a bowl. As the jello melts it can start to pull away from the bowl, similarly the vitreous throughout our lives can begin to pull away from the retina. That process is called a posterior vitreous detachment. In some cases, the vitreous can tug hard enough on the retina to form a retinal tear. I discussed with the patient the signs and symptoms of a retinal detachment.  Do not rub the eye.      ICD-10-CM   1. Exudative age-related macular degeneration of left eye with active choroidal neovascularization (HCC)  H35.3221 OCT, Retina - OU - Both Eyes    Intravitreal Injection, Pharmacologic Agent - OS - Left Eye    aflibercept (EYLEA) SOLN 2 mg  2. Exudative age-related macular degeneration of right eye with inactive choroidal neovascularization (HCC)  H35.3212 OCT, Retina - OU - Both Eyes  3. Advanced nonexudative age-related macular degeneration of both eyes with subfoveal involvement  H35.3134   4. Posterior vitreous detachment of left eye  H43.812     1.  Recurrent CME centrally OS subfoveal  could have a component of CNVM but also central RPE atrophy.  We will repeat Eylea today at 10-week interval and shorten the interval examination to 7 weeks  2.  OD with large disciform scar, not treatable.  We will continue to observe  3.  Ophthalmic Meds  Ordered this visit:  Meds ordered this encounter  Medications  . aflibercept (EYLEA) SOLN 2 mg       Return in about 7 weeks (around 04/03/2020) for dilate, OS, EYLEA OCT.  There are no Patient Instructions on file for this visit.   Explained the diagnoses, plan, and follow up with the patient and they expressed understanding.  Patient expressed understanding of the importance of proper follow up care.   Clent Demark Koji Niehoff M.D. Diseases & Surgery of the Retina and Vitreous [facility] [place] 02/14/20     Abbreviations: M myopia (nearsighted); A astigmatism; H hyperopia (farsighted); P presbyopia; Mrx spectacle prescription;  CTL contact lenses; OD right eye; OS left eye; OU both eyes  XT exotropia; ET esotropia; PEK punctate epithelial keratitis; PEE punctate epithelial erosions; DES dry eye syndrome; MGD meibomian gland dysfunction; ATs artificial tears; PFAT's preservative free artificial tears; [place] nuclear sclerotic cataract; PSC posterior subcapsular cataract; ERM epi-retinal membrane; PVD posterior vitreous detachment; RD retinal detachment; DM diabetes mellitus; DR diabetic retinopathy; NPDR non-proliferative diabetic retinopathy; PDR proliferative diabetic retinopathy; CSME clinically significant macular edema; DME diabetic macular edema; dbh dot blot hemorrhages; CWS cotton wool spot; POAG primary open angle glaucoma; C/D cup-to-disc ratio; HVF humphrey visual field; GVF goldmann visual field; OCT optical coherence tomography; IOP intraocular pressure; BRVO Branch retinal vein occlusion; CRVO central retinal vein occlusion; CRAO central retinal artery occlusion; BRAO branch retinal artery occlusion; RT retinal tear; SB scleral buckle; PPV pars plana vitrectomy; VH Vitreous hemorrhage; PRP panretinal laser photocoagulation; IVK intravitreal kenalog; VMT vitreomacular traction; MH Macular hole;  NVD neovascularization of the disc; NVE neovascularization elsewhere; AREDS age  related eye disease study; ARMD age related macular degeneration; POAG primary open angle glaucoma; EBMD epithelial/anterior basement membrane dystrophy; ACIOL anterior chamber intraocular lens; IOL intraocular lens; PCIOL posterior chamber intraocular lens; Phaco/IOL phacoemulsification with intraocular lens placement; [place] photorefractive keratectomy; LASIK laser assisted in situ keratomileusis; HTN hypertension; DM diabetes mellitus; COPD chronic obstructive pulmonary disease

## 2020-02-23 DIAGNOSIS — Z23 Encounter for immunization: Secondary | ICD-10-CM | POA: Diagnosis not present

## 2020-02-23 DIAGNOSIS — Z7901 Long term (current) use of anticoagulants: Secondary | ICD-10-CM | POA: Diagnosis not present

## 2020-02-23 DIAGNOSIS — I4891 Unspecified atrial fibrillation: Secondary | ICD-10-CM | POA: Diagnosis not present

## 2020-03-01 NOTE — Progress Notes (Signed)
HPI: FU permanent atrial fibrillation and SVT. Last echocardiogram in June of 2011 showed normal LV function. There was trivial aortic and mitral regurgitation. Also note she had a cardiac catheterization in December 2003 that showed a 20% first obtuse marginal but otherwise no obstructive disease. She was referred for atrial fibrillation ablation. She had this procedure in June of 2011. Following the procedure she did have recurrent atrial fibrillation. She was seen by Dr. Rayann Heman and consideration of repeat ablation was discussed. However the decision was made for rate control and anticoagulation. 24-hour Holter monitor in December 2015 showed rate was controlled.Nuclear study April 2018 showed ejection fraction 56% and no ischemia.Since I last saw her she denies dyspnea, chest pain, palpitations or syncope.  She does have some fatigue at times.  Current Outpatient Medications  Medication Sig Dispense Refill  . acetaminophen (TYLENOL) 325 MG tablet Take by mouth every 6 (six) hours as needed for mild pain.    Marland Kitchen amLODipine (NORVASC) 2.5 MG tablet TAKE 2 TABLETS BY MOUTH IN THE MORNING AND 1 IN THE EVENING    . calcium citrate-vitamin D (CITRACAL+D) 315-200 MG-UNIT per tablet Take 1 tablet by mouth daily.      . diclofenac sodium (VOLTAREN) 1 % GEL Apply 4 g topically 4 (four) times daily. 5 Tube 3  . fluocinonide (LIDEX) 0.05 % external solution 2 (two) times daily. 1-2 GTTS IN EARS TWICE WEEKLY AS NEEDED     . hydroxypropyl methylcellulose / hypromellose (ISOPTO TEARS / GONIOVISC) 2.5 % ophthalmic solution Place 1 drop into both eyes as needed for dry eyes.    Marland Kitchen levothyroxine (SYNTHROID, LEVOTHROID) 75 MCG tablet Take 75 mcg by mouth daily.      Marland Kitchen losartan (COZAAR) 100 MG tablet Take 100 mg by mouth daily.      . metoprolol tartrate (LOPRESSOR) 50 MG tablet Take 50 mg by mouth 2 (two) times daily.    . Multiple Vitamins-Minerals (OCUVITE PRESERVISION PO) Take 1 tablet by mouth 2 (two) times  daily.    . pantoprazole (PROTONIX) 40 MG tablet Take 40 mg by mouth 2 (two) times daily.    . polyethylene glycol powder (GLYCOLAX/MIRALAX) powder Take 17 g by mouth daily. PRN      . predniSONE (DELTASONE) 20 MG tablet     . promethazine (PHENERGAN) 25 MG tablet Take 25 mg by mouth 3 (three) times daily as needed.  0  . raloxifene (EVISTA) 60 MG tablet Take 60 mg by mouth daily.      . sucralfate (CARAFATE) 1 GM/10ML suspension Take 10 mLs (1 g total) by mouth 4 (four) times daily -  with meals and at bedtime. 420 mL 0  . warfarin (COUMADIN) 5 MG tablet TAKE 1/2 TO 1 TABLET BY MOUTH ONCE DAILY AS DIRECTED BY COUMADIN CLINIC 90 tablet 0   No current facility-administered medications for this visit.     Past Medical History:  Diagnosis Date  . Atrial fibrillation ([place])    ATRIAL FIBRILLATION S/P  PVI 6/11  . CAD (coronary artery disease)    a. 03/2002: cath showing 20% OM1 stenosis, EF at 65%, no WMA. No significant CAD.   Marland Kitchen GERD (gastroesophageal reflux disease)   . Hyperlipidemia   . Hypertension   . Macular degeneration   . SVT (supraventricular tachycardia) (HCC)     Past Surgical History:  Procedure Laterality Date  . ABLATION OF DYSRHYTHMIC FOCUS  09/2009   s/p PVI by JA  . TOTAL ABDOMINAL  HYSTERECTOMY      Social History   Socioeconomic History  . Marital status: Married    Spouse name: Not on file  . Number of children: Not on file  . Years of education: Not on file  . Highest education level: Not on file  Occupational History  . Not on file  Tobacco Use  . Smoking status: Never Smoker  . Smokeless tobacco: Never Used  Vaping Use  . Vaping Use: Never used  Substance and Sexual Activity  . Alcohol use: No  . Drug use: No  . Sexual activity: Not on file  Other Topics Concern  . Not on file  Social History Narrative   Lives with spouse   Social Determinants of Health   Financial Resource Strain:   . Difficulty of Paying Living Expenses: Not on file    Food Insecurity:   . Worried About Charity fundraiser in the Last Year: Not on file  . Ran Out of Food in the Last Year: Not on file  Transportation Needs:   . Lack of Transportation (Medical): Not on file  . Lack of Transportation (Non-Medical): Not on file  Physical Activity:   . Days of Exercise per Week: Not on file  . Minutes of Exercise per Session: Not on file  Stress:   . Feeling of Stress : Not on file  Social Connections:   . Frequency of Communication with Friends and Family: Not on file  . Frequency of Social Gatherings with Friends and Family: Not on file  . Attends Religious Services: Not on file  . Active Member of Clubs or Organizations: Not on file  . Attends Archivist Meetings: Not on file  . Marital Status: Not on file  Intimate Partner Violence:   . Fear of Current or Ex-Partner: Not on file  . Emotionally Abused: Not on file  . Physically Abused: Not on file  . Sexually Abused: Not on file    Family History  Problem Relation Age of Onset  . Stroke Other   . Coronary artery disease Other     ROS: Fatigue but no fevers or chills, productive cough, hemoptysis, dysphasia, odynophagia, melena, hematochezia, dysuria, hematuria, rash, seizure activity, orthopnea, PND, pedal edema, claudication. Remaining systems are negative.  Physical Exam: Well-developed well-nourished in no acute distress.  Skin is warm and dry.  HEENT is normal.  Neck is supple.  Chest is clear to auscultation with normal expansion.  Cardiovascular exam is irregular Abdominal exam nontender or distended. No masses palpated. Extremities show no edema. neuro grossly intact  ECG-atrial fibrillation at a rate of 76, normal axis, no ST changes. Personally reviewed  A/P  1 permanent atrial fibrillation-continue metoprolol at present dose for rate control.  Continue Coumadin.  She declines DOAC's.  2 hypertension-patient's blood pressure is elevated; however she states it is  typically controlled at home.  She will follow and we will advance amlodipine if needed.  3 history of SVT-no recurrences.  Continue beta-blocker.  Kirk Ruths, MD

## 2020-03-13 ENCOUNTER — Encounter: Payer: Self-pay | Admitting: Cardiology

## 2020-03-13 ENCOUNTER — Ambulatory Visit (INDEPENDENT_AMBULATORY_CARE_PROVIDER_SITE_OTHER): Payer: Medicare Other | Admitting: Cardiology

## 2020-03-13 ENCOUNTER — Other Ambulatory Visit: Payer: Self-pay

## 2020-03-13 VITALS — BP 170/80 | HR 76 | Ht 61.0 in | Wt 150.0 lb

## 2020-03-13 DIAGNOSIS — I4821 Permanent atrial fibrillation: Secondary | ICD-10-CM

## 2020-03-13 DIAGNOSIS — I471 Supraventricular tachycardia, unspecified: Secondary | ICD-10-CM

## 2020-03-13 DIAGNOSIS — I10 Essential (primary) hypertension: Secondary | ICD-10-CM

## 2020-03-13 NOTE — Patient Instructions (Signed)

## 2020-03-22 DIAGNOSIS — I4891 Unspecified atrial fibrillation: Secondary | ICD-10-CM | POA: Diagnosis not present

## 2020-03-22 DIAGNOSIS — Z7901 Long term (current) use of anticoagulants: Secondary | ICD-10-CM | POA: Diagnosis not present

## 2020-04-03 ENCOUNTER — Ambulatory Visit (INDEPENDENT_AMBULATORY_CARE_PROVIDER_SITE_OTHER): Payer: Medicare Other | Admitting: Ophthalmology

## 2020-04-03 ENCOUNTER — Encounter (INDEPENDENT_AMBULATORY_CARE_PROVIDER_SITE_OTHER): Payer: Self-pay | Admitting: Ophthalmology

## 2020-04-03 ENCOUNTER — Other Ambulatory Visit: Payer: Self-pay

## 2020-04-03 DIAGNOSIS — H353212 Exudative age-related macular degeneration, right eye, with inactive choroidal neovascularization: Secondary | ICD-10-CM | POA: Diagnosis not present

## 2020-04-03 DIAGNOSIS — H353221 Exudative age-related macular degeneration, left eye, with active choroidal neovascularization: Secondary | ICD-10-CM | POA: Diagnosis not present

## 2020-04-03 MED ORDER — BEVACIZUMAB 2.5 MG/0.1ML IZ SOSY
2.5000 mg | PREFILLED_SYRINGE | INTRAVITREAL | Status: AC | PRN
  Administered 2020-04-03: 2.5 mg via INTRAVITREAL

## 2020-04-03 NOTE — Progress Notes (Addendum)
04/03/2020     CHIEF COMPLAINT Patient presents for Retina Follow Up (7 Week AMD F/U OS, poss Eylea OS//Pt denies noticeable changes to [place] OU since last visit. Pt denies ocular pain, flashes of light, or floaters OU. //)   HISTORY OF PRESENT ILLNESS: Sylvia [name] is a 86 y.o. female who presents to the clinic today for:   HPI    Retina Follow Up    Patient presents with  Wet AMD.  In left eye.  This started 7 weeks ago.  Severity is mild.  Duration of 7 weeks.  Since onset it is stable. Additional comments: 7 Week AMD F/U OS, poss Eylea OS  Pt denies noticeable changes to [place] OU since last visit. Pt denies ocular pain, flashes of light, or floaters OU.          Last edited by Rockie Neighbours, [place] on 04/03/2020  1:05 PM. (History)      Referring physician: Leanna Battles, MD [place],  [place] [id number]  HISTORICAL INFORMATION:   Selected notes from the MEDICAL RECORD NUMBER       CURRENT MEDICATIONS: Current Outpatient Medications (Ophthalmic Drugs)  Medication Sig  . hydroxypropyl methylcellulose / hypromellose (ISOPTO TEARS / GONIOVISC) 2.5 % ophthalmic solution Place 1 drop into both eyes as needed for dry eyes.   No current facility-administered medications for this visit. (Ophthalmic Drugs)   Current Outpatient Medications (Other)  Medication Sig  . acetaminophen (TYLENOL) 325 MG tablet Take by mouth every 6 (six) hours as needed for mild pain.  Marland Kitchen amLODipine (NORVASC) 2.5 MG tablet TAKE 2 TABLETS BY MOUTH IN THE MORNING AND 1 IN THE EVENING  . calcium citrate-vitamin D (CITRACAL+D) 315-200 MG-UNIT per tablet Take 1 tablet by mouth daily.    . diclofenac sodium (VOLTAREN) 1 % GEL Apply 4 g topically 4 (four) times daily.  . fluocinonide (LIDEX) 0.05 % external solution 2 (two) times daily. 1-2 GTTS IN EARS TWICE WEEKLY AS NEEDED   . levothyroxine (SYNTHROID, LEVOTHROID) 75 MCG tablet Take 75 mcg by mouth daily.    Marland Kitchen losartan (COZAAR) 100 MG  tablet Take 100 mg by mouth daily.    . metoprolol tartrate (LOPRESSOR) 50 MG tablet Take 50 mg by mouth 2 (two) times daily.  . Multiple Vitamins-Minerals (OCUVITE PRESERVISION PO) Take 1 tablet by mouth 2 (two) times daily.  . pantoprazole (PROTONIX) 40 MG tablet Take 40 mg by mouth 2 (two) times daily.  . polyethylene glycol powder (GLYCOLAX/MIRALAX) powder Take 17 g by mouth daily. PRN    . predniSONE (DELTASONE) 20 MG tablet   . promethazine (PHENERGAN) 25 MG tablet Take 25 mg by mouth 3 (three) times daily as needed.  . raloxifene (EVISTA) 60 MG tablet Take 60 mg by mouth daily.    . sucralfate (CARAFATE) 1 GM/10ML suspension Take 10 mLs (1 g total) by mouth 4 (four) times daily -  with meals and at bedtime.  Marland Kitchen warfarin (COUMADIN) 5 MG tablet TAKE 1/2 TO 1 TABLET BY MOUTH ONCE DAILY AS DIRECTED BY COUMADIN CLINIC   No current facility-administered medications for this visit. (Other)      REVIEW OF SYSTEMS:    ALLERGIES Allergies  Allergen Reactions  . Metoclopramide Hcl     REACTION: nervous, climbs the wall  . Penicillins     REACTION: hives  . Pravastatin   . Sulfonamide Derivatives     REACTION: rash    PAST MEDICAL HISTORY Past Medical History:  Diagnosis Date  . Atrial fibrillation ([place])    ATRIAL FIBRILLATION S/P  PVI 6/11  . CAD (coronary artery disease)    a. 03/2002: cath showing 20% OM1 stenosis, EF at 65%, no WMA. No significant CAD.   Marland Kitchen GERD (gastroesophageal reflux disease)   . Hyperlipidemia   . Hypertension   . Macular degeneration   . SVT (supraventricular tachycardia) (HCC)    Past Surgical History:  Procedure Laterality Date  . ABLATION OF DYSRHYTHMIC FOCUS  09/2009   s/p PVI by JA  . TOTAL ABDOMINAL HYSTERECTOMY      FAMILY HISTORY Family History  Problem Relation Age of Onset  . Stroke Other   . Coronary artery disease Other     SOCIAL HISTORY Social History   Tobacco Use  . Smoking status: Never Smoker  . Smokeless tobacco:  Never Used  Vaping Use  . Vaping Use: Never used  Substance Use Topics  . Alcohol use: No  . Drug use: No         OPHTHALMIC EXAM:  Base Eye Exam    Visual Acuity (ETDRS)      Right Left   Dist cc CF @ 1' 20/60 +2   Dist ph cc NI 20/50 +2       Tonometry (Tonopen, 1:06 PM)      Right Left   Pressure 11 13       Pupils      Pupils Dark Light Shape React APD   Right PERRL 2 2 Round Minimal None   Left PERRL 2 2 Round Minimal None       Visual Fields (Counting fingers)      Left Right    Full Full       Extraocular Movement      Right Left    Full Full       Neuro/Psych    Oriented x3: Yes   Mood/Affect: Normal       Dilation    Left eye: 1.0% Mydriacyl, 2.5% Phenylephrine @ 1:08 PM        Slit Lamp and Fundus Exam    External Exam      Right Left   External Normal Normal       Slit Lamp Exam      Right Left   Lids/Lashes Normal Normal   Conjunctiva/Sclera White and quiet White and quiet   Cornea Clear Clear   Anterior Chamber Deep and quiet Deep and quiet   Iris Round and reactive Round and reactive   Lens Posterior chamber intraocular lens Posterior chamber intraocular lens   Anterior Vitreous Normal Normal       Fundus Exam      Right Left   Posterior Vitreous  Posterior vitreous detachment   Disc  Normal   C/D Ratio  0.1   Macula  Age related macular degeneration, Atrophy into faz, advanced age related macular degeneration, Retinal pigment epithelial atrophy, Geographic atrophy in the FAZ,    Vessels  Normal   Periphery  Normal          IMAGING AND PROCEDURES  Imaging and Procedures for 04/03/20  OCT, Retina - OU - Both Eyes       Right Eye Quality was good. Scan locations included subfoveal. Central Foveal Thickness: 657. Progression has been stable. Findings include abnormal foveal contour, retinal drusen , cystoid macular edema, intraretinal fluid, disciform scar.   Left Eye Quality was good. Scan locations included  subfoveal. Central Foveal Thickness: 256.  Progression has improved. Findings include abnormal foveal contour, retinal drusen .   Notes OS with much less subretinal fluid 5 weeks post intravitreal Avastin OS today , Repeat injection intravitreal Avastin OS today  OD with chronic subfoveal disciform scar, chronic CME intraretinal fluid, not treatable  Stable       Intravitreal Injection, Pharmacologic Agent - OS - Left Eye       Time Out 04/03/2020. 1:39 PM. Confirmed correct patient, procedure, site, and patient consented.   Anesthesia Topical anesthesia was used. Anesthetic medications included [place] 3.5%.   Procedure Preparation included Tobramycin 0.3%, Ofloxacin , 10% betadine to eyelids, 5% betadine to ocular surface. A 30 gauge needle was used.   Injection:  2.5 mg Bevacizumab (AVASTIN) 2.5mg /0.61mL SOSY   NDC: [id number], [id number]   Route: Intravitreal, Site: Left Eye  Post-op Post injection exam found visual acuity of at least counting fingers. The patient tolerated the procedure well. There were no complications. The patient received written and verbal post procedure care education. Post injection medications were not given.                 ASSESSMENT/PLAN:  Exudative age-related macular degeneration of right eye with inactive choroidal neovascularization (HCC) OD, chronic stable CNVM disciform scar, evaluated by OCT today stable observe  Exudative age-related macular degeneration of left eye with active choroidal neovascularization ([place]) Recent worsening of CNVM OS, November 2021, much improved today some 5 weeks post treatment.  Will repeat injection OS today and examination again in 5 weeks      ICD-10-CM   1. Exudative age-related macular degeneration of left eye with active choroidal neovascularization (HCC)  H35.3221 OCT, Retina - OU - Both Eyes    Intravitreal Injection, Pharmacologic Agent - OS - Left Eye    bevacizumab (AVASTIN) SOSY 2.5 mg   2. Exudative age-related macular degeneration of right eye with inactive choroidal neovascularization ([place])  H35.3212     1.  2.  3.  Ophthalmic Meds Ordered this visit:  Meds ordered this encounter  Medications  . bevacizumab (AVASTIN) SOSY 2.5 mg       Return in about 5 weeks (around 05/08/2020) for dilate, OS, EYLEA OCT.  There are no Patient Instructions on file for this visit.   Explained the diagnoses, plan, and follow up with the patient and they expressed understanding.  Patient expressed understanding of the importance of proper follow up care.   Clent Demark Jeanelle Dake M.D. Diseases & Surgery of the Retina and Vitreous [facility] [place] 04/03/20     Abbreviations: M myopia (nearsighted); A astigmatism; H hyperopia (farsighted); P presbyopia; Mrx spectacle prescription;  CTL contact lenses; OD right eye; OS left eye; OU both eyes  XT exotropia; ET esotropia; PEK punctate epithelial keratitis; PEE punctate epithelial erosions; DES dry eye syndrome; MGD meibomian gland dysfunction; ATs artificial tears; PFAT's preservative free artificial tears; [place] nuclear sclerotic cataract; PSC posterior subcapsular cataract; ERM epi-retinal membrane; PVD posterior vitreous detachment; RD retinal detachment; DM diabetes mellitus; DR diabetic retinopathy; NPDR non-proliferative diabetic retinopathy; PDR proliferative diabetic retinopathy; CSME clinically significant macular edema; DME diabetic macular edema; dbh dot blot hemorrhages; CWS cotton wool spot; POAG primary open angle glaucoma; C/D cup-to-disc ratio; HVF humphrey visual field; GVF goldmann visual field; OCT optical coherence tomography; IOP intraocular pressure; BRVO Branch retinal vein occlusion; CRVO central retinal vein occlusion; CRAO central retinal artery occlusion; BRAO branch retinal artery occlusion; RT retinal tear; SB scleral buckle; PPV pars plana vitrectomy; VH Vitreous  hemorrhage; PRP panretinal laser  photocoagulation; IVK intravitreal kenalog; VMT vitreomacular traction; MH Macular hole;  NVD neovascularization of the disc; NVE neovascularization elsewhere; AREDS age related eye disease study; ARMD age related macular degeneration; POAG primary open angle glaucoma; EBMD epithelial/anterior basement membrane dystrophy; ACIOL anterior chamber intraocular lens; IOL intraocular lens; PCIOL posterior chamber intraocular lens; Phaco/IOL phacoemulsification with intraocular lens placement; [place] photorefractive keratectomy; LASIK laser assisted in situ keratomileusis; HTN hypertension; DM diabetes mellitus; COPD chronic obstructive pulmonary disease

## 2020-04-03 NOTE — Assessment & Plan Note (Signed)
OD, chronic stable CNVM disciform scar, evaluated by OCT today stable observe

## 2020-04-03 NOTE — Assessment & Plan Note (Signed)
Recent worsening of CNVM OS, November 2021, much improved today some 5 weeks post treatment.  Will repeat injection OS today and examination again in 5 weeks

## 2020-04-17 ENCOUNTER — Other Ambulatory Visit: Payer: Self-pay

## 2020-04-17 ENCOUNTER — Emergency Department (HOSPITAL_COMMUNITY)
Admission: EM | Admit: 2020-04-17 | Discharge: 2020-04-18 | Disposition: A | Discharge status: Home / Self Care | Payer: Medicare Other | Attending: Emergency Medicine | Admitting: Emergency Medicine

## 2020-04-17 DIAGNOSIS — K219 Gastro-esophageal reflux disease without esophagitis: Secondary | ICD-10-CM | POA: Diagnosis not present

## 2020-04-17 DIAGNOSIS — K8010 Calculus of gallbladder with chronic cholecystitis without obstruction: Secondary | ICD-10-CM

## 2020-04-17 DIAGNOSIS — I4891 Unspecified atrial fibrillation: Secondary | ICD-10-CM | POA: Insufficient documentation

## 2020-04-17 DIAGNOSIS — I10 Essential (primary) hypertension: Secondary | ICD-10-CM | POA: Diagnosis not present

## 2020-04-17 DIAGNOSIS — Z79899 Other long term (current) drug therapy: Secondary | ICD-10-CM | POA: Insufficient documentation

## 2020-04-17 DIAGNOSIS — I2510 Atherosclerotic heart disease of native coronary artery without angina pectoris: Secondary | ICD-10-CM | POA: Diagnosis not present

## 2020-04-17 DIAGNOSIS — K8000 Calculus of gallbladder with acute cholecystitis without obstruction: Secondary | ICD-10-CM | POA: Insufficient documentation

## 2020-04-17 DIAGNOSIS — Z7901 Long term (current) use of anticoagulants: Secondary | ICD-10-CM | POA: Diagnosis not present

## 2020-04-17 DIAGNOSIS — I119 Hypertensive heart disease without heart failure: Secondary | ICD-10-CM | POA: Insufficient documentation

## 2020-04-17 DIAGNOSIS — R1032 Left lower quadrant pain: Secondary | ICD-10-CM

## 2020-04-17 LAB — CBC
HCT: 41.4 % (ref 36.0–46.0)
Hemoglobin: 13.8 g/dL (ref 12.0–15.0)
MCH: 30.9 pg (ref 26.0–34.0)
MCHC: 33.3 g/dL (ref 30.0–36.0)
MCV: 92.8 fL (ref 80.0–100.0)
Platelets: 226 10*3/uL (ref 150–400)
RBC: 4.46 MIL/uL (ref 3.87–5.11)
RDW: 15.1 % (ref 11.5–15.5)
WBC: 6.9 10*3/uL (ref 4.0–10.5)
nRBC: 0 % (ref 0.0–0.2)

## 2020-04-17 LAB — COMPREHENSIVE METABOLIC PANEL
ALT: 13 U/L (ref 0–44)
AST: 27 U/L (ref 15–41)
Albumin: 3.4 g/dL — ABNORMAL LOW (ref 3.5–5.0)
Alkaline Phosphatase: 48 U/L (ref 38–126)
Anion gap: 12 (ref 5–15)
BUN: 22 mg/dL (ref 8–23)
CO2: 19 mmol/L — ABNORMAL LOW (ref 22–32)
Calcium: 9 mg/dL (ref 8.9–10.3)
Chloride: 105 mmol/L (ref 98–111)
Creatinine, Ser: 1.12 mg/dL — ABNORMAL HIGH (ref 0.44–1.00)
GFR, Estimated: 48 mL/min — ABNORMAL LOW (ref >60)
Glucose, Bld: 110 mg/dL — ABNORMAL HIGH (ref 70–99)
Potassium: 3.6 mmol/L (ref 3.5–5.1)
Sodium: 136 mmol/L (ref 135–145)
Total Bilirubin: 0.9 mg/dL (ref 0.3–1.2)
Total Protein: 6.6 g/dL (ref 6.5–8.1)

## 2020-04-17 LAB — TYPE AND SCREEN
ABO/RH(D): A POS
Antibody Screen: NEGATIVE

## 2020-04-17 NOTE — ED Notes (Addendum)
Sylvia [name] [phone] please call when she gets in a room

## 2020-04-17 NOTE — ED Triage Notes (Signed)
Patient stated she's been having NV and episode of dark diarrhea since Saturday. Stated she takes coumadin for afib. Concern for UTI as well.

## 2020-04-18 ENCOUNTER — Emergency Department (HOSPITAL_COMMUNITY): Payer: Medicare Other

## 2020-04-18 ENCOUNTER — Ambulatory Visit: Payer: Medicare Other | Admitting: Orthopaedic Surgery

## 2020-04-18 DIAGNOSIS — K8000 Calculus of gallbladder with acute cholecystitis without obstruction: Secondary | ICD-10-CM | POA: Diagnosis not present

## 2020-04-18 LAB — URINALYSIS, ROUTINE W REFLEX MICROSCOPIC
Bacteria, UA: NONE SEEN
Bilirubin Urine: NEGATIVE
Glucose, UA: NEGATIVE mg/dL
Hgb urine dipstick: NEGATIVE
Ketones, ur: NEGATIVE mg/dL
Leukocytes,Ua: NEGATIVE
Nitrite: NEGATIVE
Protein, ur: NEGATIVE mg/dL
Specific Gravity, Urine: 1.021 (ref 1.005–1.030)
pH: 5 (ref 5.0–8.0)

## 2020-04-18 LAB — PROTIME-INR
INR: 2 — ABNORMAL HIGH (ref 0.8–1.2)
Prothrombin Time: 21.9 seconds — ABNORMAL HIGH (ref 11.4–15.2)

## 2020-04-18 LAB — POC OCCULT BLOOD, ED: Fecal Occult Bld: NEGATIVE

## 2020-04-18 MED ORDER — IOHEXOL 300 MG/ML  SOLN
100.0000 mL | Freq: Once | INTRAMUSCULAR | Status: AC | PRN
  Administered 2020-04-18: 100 mL via INTRAVENOUS

## 2020-04-18 NOTE — ED Provider Notes (Signed)
[facility] EMERGENCY DEPARTMENT Provider Note   CSN: [id number] Arrival date & time: 04/17/20  1506     History Chief Complaint  Patient presents with  . Diarrhea  . Abdominal Pain    Sylvia [name] is a 87 y.o. female with PMH/o Afib (Coumadin), CAD, GERD, HLD, HTN, who presents for evaluation of black stool, abdominal pain that started about 4 days ago.  She reports that 4 days ago, she started having some abdominal pain, nausea.  She reports that she had episode of vomiting as well as diarrhea.  She reports that the stool was very loose and was black.  She states that there is a large volume to it.  There is no blood noted in the vomit.  She states that since then, she has had not had any more episodes of that.  She is still had some mild abdominal pain that she feels like it is improving.  She has not had any fevers.  She states she has had some decreased appetite.  She told her primary care doctor who advised her to come to the emergency department.  She has not noted any fever, chest pain, urinary complaints.  She is on Coumadin for A. fib and states she has been compliant with it.  No prior history of diverticulitis, GI bleed.  The history is provided by the patient.       Past Medical History:  Diagnosis Date  . Atrial fibrillation ([place])    ATRIAL FIBRILLATION S/P  PVI 6/11  . CAD (coronary artery disease)    a. 03/2002: cath showing 20% OM1 stenosis, EF at 65%, no WMA. No significant CAD.   Marland Kitchen GERD (gastroesophageal reflux disease)   . Hyperlipidemia   . Hypertension   . Macular degeneration   . SVT (supraventricular tachycardia) [facility])     Patient Active Problem List   Diagnosis Date Noted  . Bilateral primary osteoarthritis of knee 01/11/2020  . Advanced nonexudative age-related macular degeneration of both eyes with subfoveal involvement 12/06/2019  . Exudative age-related macular degeneration of left eye with active choroidal neovascularization  ([place]) 07/21/2019  . Exudative age-related macular degeneration of right eye with inactive choroidal neovascularization ([place]) 07/21/2019  . Cystoid macular edema of right eye 07/21/2019  . Posterior vitreous detachment of left eye 07/21/2019  . Cervical spondylosis without myelopathy 01/18/2019  . Spondylolisthesis of cervical region 01/18/2019  . Obstructive sleep apnea treated with continuous positive airway pressure (CPAP) 06/02/2018  . Unilateral primary osteoarthritis, right knee 03/02/2017  . Unilateral primary osteoarthritis, right knee 03/02/2017  . Long term (current) use of anticoagulants 07/26/2010  . FATIGUE 05/23/2010  . HYPERCHOLESTEROLEMIA, PURE 02/13/2008  . HYPERTENSION, BENIGN 02/13/2008  . SVT/ PSVT/ PAT 02/13/2008  . ATRIAL FIBRILLATION 02/13/2008  . GERD 02/13/2008  . PALPITATIONS 02/13/2008    Past Surgical History:  Procedure Laterality Date  . ABLATION OF DYSRHYTHMIC FOCUS  09/2009   s/p PVI by JA  . TOTAL ABDOMINAL HYSTERECTOMY       OB History   No obstetric history on file.     Family History  Problem Relation Age of Onset  . Stroke Other   . Coronary artery disease Other     Social History   Tobacco Use  . Smoking status: Never Smoker  . Smokeless tobacco: Never Used  Vaping Use  . Vaping Use: Never used  Substance Use Topics  . Alcohol use: No  . Drug use: No    Home Medications  Prior to Admission medications   Medication Sig Start Date End Date Taking? Authorizing Provider  acetaminophen (TYLENOL) 325 MG tablet Take by mouth every 6 (six) hours as needed for mild pain.    [provider]  amLODipine (NORVASC) 2.5 MG tablet TAKE 2 TABLETS BY MOUTH IN THE MORNING AND 1 IN THE EVENING 06/20/19   [provider]  calcium citrate-vitamin D (CITRACAL+D) 315-200 MG-UNIT per tablet Take 1 tablet by mouth daily.      [provider]  diclofenac sodium (VOLTAREN) 1 % GEL Apply 4 g topically 4 (four) times daily. 07/14/18    Magnus Sinning, MD  fluocinonide (LIDEX) 0.05 % external solution 2 (two) times daily. 1-2 GTTS IN EARS TWICE WEEKLY AS NEEDED     [provider]  hydroxypropyl methylcellulose / hypromellose (ISOPTO TEARS / GONIOVISC) 2.5 % ophthalmic solution Place 1 drop into both eyes as needed for dry eyes.    [provider]  levothyroxine (SYNTHROID, LEVOTHROID) 75 MCG tablet Take 75 mcg by mouth daily.      [provider]  losartan (COZAAR) 100 MG tablet Take 100 mg by mouth daily.      [provider]  metoprolol tartrate (LOPRESSOR) 50 MG tablet Take 50 mg by mouth 2 (two) times daily.    [provider]  Multiple Vitamins-Minerals (OCUVITE PRESERVISION PO) Take 1 tablet by mouth 2 (two) times daily.    [provider]  pantoprazole (PROTONIX) 40 MG tablet Take 40 mg by mouth 2 (two) times daily.    [provider]  polyethylene glycol powder (GLYCOLAX/MIRALAX) powder Take 17 g by mouth daily. PRN      [provider]  predniSONE (DELTASONE) 20 MG tablet  06/15/18   [provider]  promethazine (PHENERGAN) 25 MG tablet Take 25 mg by mouth 3 (three) times daily as needed. 07/13/17   [provider]  raloxifene (EVISTA) 60 MG tablet Take 60 mg by mouth daily.      [provider]  sucralfate (CARAFATE) 1 GM/10ML suspension Take 10 mLs (1 g total) by mouth 4 (four) times daily -  with meals and at bedtime. 07/14/17   Carmin Muskrat, MD  warfarin (COUMADIN) 5 MG tablet TAKE 1/2 TO 1 TABLET BY MOUTH ONCE DAILY AS DIRECTED BY COUMADIN CLINIC 07/11/19   Lelon Perla, MD    Allergies    Metoclopramide hcl, Penicillins, Pravastatin, and Sulfonamide derivatives  Review of Systems   Review of Systems  Constitutional: Negative for fever.  Respiratory: Negative for cough and shortness of breath.   Cardiovascular: Negative for chest pain.  Gastrointestinal: Positive for abdominal pain and blood in stool.  Negative for nausea and vomiting.  Genitourinary: Negative for dysuria and hematuria.  Neurological: Negative for headaches.  All other systems reviewed and are negative.   Physical Exam Updated Vital Signs BP (!) 150/67 (BP Location: Right Arm)   Pulse (!) 101   Temp 97.8 F (36.6 C) (Oral)   Resp 20   Ht 5\' 1"  (1.549 m)   Wt 68 kg   SpO2 98%   BMI 28.34 kg/m   Physical Exam Vitals and nursing note reviewed.  Constitutional:      Appearance: Normal appearance. She is well-developed and well-nourished.  HENT:     Head: Normocephalic and atraumatic.     Mouth/Throat:     Mouth: Oropharynx is clear and moist and mucous membranes are normal.  Eyes:     General: Lids are  normal.     Extraocular Movements: EOM normal.     Conjunctiva/sclera: Conjunctivae normal.     Pupils: Pupils are equal, round, and reactive to light.  Cardiovascular:     Rate and Rhythm: Normal rate and regular rhythm.     Pulses: Normal pulses.     Heart sounds: Normal heart sounds. No murmur heard. No friction rub. No gallop.   Pulmonary:     Effort: Pulmonary effort is normal.     Breath sounds: Normal breath sounds.     Comments: Lungs clear to auscultation bilaterally.  Symmetric chest rise.  No wheezing, rales, rhonchi. Abdominal:     Palpations: Abdomen is soft. Abdomen is not rigid.     Tenderness: There is abdominal tenderness in the left lower quadrant. There is no guarding.     Comments: Abdomen is soft, non-distended. Tenderness noted to the LLQ. No rigidity, No guarding. No peritoneal signs.   Genitourinary:    Comments: The exam was performed with a chaperone present Caryl Pina, [place]). Digital Rectal Exam reveals sphincter with good tone. Small non-thrombosed external hemorrhoid. No masses or fissures. Stool color is brown with no overt blood. Musculoskeletal:        General: Normal range of motion.     Cervical back: Full passive range of motion without pain.  Skin:    General: Skin is  warm and dry.     Capillary Refill: Capillary refill takes less than 2 seconds.  Neurological:     Mental Status: She is alert and oriented to person, place, and time.  Psychiatric:        Mood and Affect: Mood and affect normal.        Speech: Speech normal.     ED Results / Procedures / Treatments   Labs (all labs ordered are listed, but only abnormal results are displayed) Labs Reviewed  COMPREHENSIVE METABOLIC PANEL - Abnormal; Notable for the following components:      Result Value   CO2 19 (*)    Glucose, Bld 110 (*)    Creatinine, Ser 1.12 (*)    Albumin 3.4 (*)    GFR, Estimated 48 (*)    All other components within normal limits  URINALYSIS, ROUTINE W REFLEX MICROSCOPIC - Abnormal; Notable for the following components:   Color, Urine AMBER (*)    APPearance CLOUDY (*)    All other components within normal limits  PROTIME-INR - Abnormal; Notable for the following components:   Prothrombin Time 21.9 (*)    INR 2.0 (*)    All other components within normal limits  CBC  PROTIME-INR  POC OCCULT BLOOD, ED  TYPE AND SCREEN  ABO/RH    EKG None  Radiology CT ABDOMEN PELVIS W CONTRAST  Result Date: 04/18/2020 CLINICAL DATA:  Nausea and vomiting, diarrhea for 3 days, left lower quadrant pain EXAM: CT ABDOMEN AND PELVIS WITH CONTRAST TECHNIQUE: Multidetector CT imaging of the abdomen and pelvis was performed using the standard protocol following bolus administration of intravenous contrast. CONTRAST:  171mL OMNIPAQUE IOHEXOL 300 MG/ML  SOLN COMPARISON:  07/14/2017 FINDINGS: Lower chest: No acute pleural or parenchymal lung disease. Hepatobiliary: Small calcified gallstones layer dependently within the gallbladder. No evidence of cholecystitis. Liver is unremarkable. Pancreas: Unremarkable. No pancreatic ductal dilatation or surrounding inflammatory changes. Spleen: Normal in size without focal abnormality. Adrenals/Urinary Tract: Adrenal glands are unremarkable. Kidneys are  normal, without renal calculi, focal lesion, or hydronephrosis. Bladder is unremarkable. Stomach/Bowel: No bowel obstruction or ileus. No bowel  wall thickening or inflammatory change. Small hiatal hernia. Vascular/Lymphatic: Aortic atherosclerosis. No enlarged abdominal or pelvic lymph nodes. Reproductive: Status post hysterectomy. No adnexal masses. Other: No free fluid or free gas.  No abdominal wall hernia. Musculoskeletal: No acute or destructive bony lesions. Reconstructed images demonstrate no additional findings. IMPRESSION: 1. Cholelithiasis without cholecystitis. 2. Small hiatal hernia. 3.  Aortic Atherosclerosis (ICD10-I70.0). Electronically Signed   By: Sharlet Salina M.D.   On: 04/18/2020 03:08    Procedures Procedures (including critical care time)  Medications Ordered in ED Medications  iohexol (OMNIPAQUE) 300 MG/ML solution 100 mL (100 mLs Intravenous Contrast Given 04/18/20 0249)    ED Course  I have reviewed the triage vital signs and the nursing notes.  Pertinent labs & imaging results that were available during my care of the patient were reviewed by me and considered in my medical decision making (see chart for details).    MDM Rules/Calculators/A&P                          87 year old female past history of A. fib (on Coumadin) who presents for evaluation of abdominal pain, black stools.  She reports that she had an episode about 4 days ago.  Since then, she has had not had any more episodes of black stools but has continued to have some abdominal pain though she states it is improved.  No fevers.  She is on Coumadin for A. fib and states she has taken all her medications.  On initial arrival, she is afebrile, nontoxic-appearing.  Vital signs are stable.  On exam, she has tenderness noted to the left lower quadrant.  No rigidity, guarding.  No CVA tenderness noted.  On digital rectal exam, she does not have any gross melena on my rectal exam.  She has a small nonthrombosed  external hemorrhoid.  Given that she has tenderness in the left lower quadrant, consider diverticulitis.  Doubt GI bleed given that symptoms have resolved.  She has not had any more melena here in the ED.  Fecal occult negative.  CMP shows BUN of 22, creatinine of 1.12.  CBC shows no leukocytosis. UA shows no evidence of infectious etiology. INR is 2.0.   CT abd/pelvis cholelithiasis without cholecystitis. There is a small hiatal hernia. There was unremarkable.  Patient with no right upper quadrant tenderness.  Exam and work-up not consistent with cholecystitis.   Reevaluation. Patient resting comfortably in bed. No signs of distress. She is hemodynamically stable at this time. This time, work-up is reassuring. At this time, patient exhibits no emergent life-threatening condition that require further evaluation in ED. Discussed patient with Dr. Eudelia Bunch who is agreeable to plan. Patient had ample opportunity for questions and discussion. All patient's questions were answered with full understanding. Strict return precautions discussed. Patient expresses understanding and agreement to plan.   Portions of this note were generated with Scientist, clinical (histocompatibility and immunogenetics). Dictation errors may occur despite best attempts at proofreading.  Final Clinical Impression(s) / ED Diagnoses Final diagnoses:  Left lower quadrant abdominal pain  Calculus of gallbladder with cholecystitis without biliary obstruction, unspecified cholecystitis acuity    Rx / DC Orders ED Discharge Orders    None       Rosana Hoes 04/18/20 0529    Nira Conn, MD 04/19/20 1821

## 2020-04-18 NOTE — ED Notes (Signed)
Pt ambulatory to restroom without difficulty with standby assistance. Pt transported to CT scan.

## 2020-04-18 NOTE — Discharge Instructions (Addendum)
As you we discussed, your work-up today showed that your blood levels were stable. There was no blood noted on your rectal exam.  Your CT scan today showed evidence of gallstones but no surrounding infection. This is most likely not contributing to your pain today but it is something to monitor. If you start having pain in the right upper portion of your abdomen, this could be your gallstones.  Return to emergency department for any worsening abdominal pain, vomiting, diarrhea, black or tarry stools, fever, chest pain, difficulty breathing or any other worsening concerning symptoms.

## 2020-04-18 NOTE — ED Notes (Signed)
Urine culture tube sent down with urine sample.   

## 2020-04-18 NOTE — ED Notes (Signed)
Pt verbalizes understanding of d/c instructions. Pt ambulatory at d/c with all belongings.   

## 2020-04-19 DIAGNOSIS — Z7901 Long term (current) use of anticoagulants: Secondary | ICD-10-CM | POA: Diagnosis not present

## 2020-04-19 DIAGNOSIS — R1032 Left lower quadrant pain: Secondary | ICD-10-CM | POA: Diagnosis not present

## 2020-04-19 DIAGNOSIS — R197 Diarrhea, unspecified: Secondary | ICD-10-CM | POA: Diagnosis not present

## 2020-04-19 DIAGNOSIS — I4891 Unspecified atrial fibrillation: Secondary | ICD-10-CM | POA: Diagnosis not present

## 2020-04-24 ENCOUNTER — Ambulatory Visit (INDEPENDENT_AMBULATORY_CARE_PROVIDER_SITE_OTHER): Payer: Medicare Other | Admitting: Orthopaedic Surgery

## 2020-04-24 ENCOUNTER — Encounter: Payer: Self-pay | Admitting: Orthopaedic Surgery

## 2020-04-24 ENCOUNTER — Other Ambulatory Visit: Payer: Self-pay

## 2020-04-24 VITALS — Ht 61.0 in | Wt 150.0 lb

## 2020-04-24 DIAGNOSIS — M170 Bilateral primary osteoarthritis of knee: Secondary | ICD-10-CM

## 2020-04-24 DIAGNOSIS — M1711 Unilateral primary osteoarthritis, right knee: Secondary | ICD-10-CM

## 2020-04-24 MED ORDER — BUPIVACAINE HCL 0.25 % IJ SOLN
2.0000 mL | INTRAMUSCULAR | Status: AC | PRN
  Administered 2020-04-24: 2 mL via INTRA_ARTICULAR

## 2020-04-24 NOTE — Progress Notes (Signed)
Office Visit Note   Patient: Sylvia [name]           Date of Birth: 01/07/1933           MRN: [id number] Visit Date: 04/24/2020              Requested by: Leanna Battles, MD [place],  [place] [id number] PCP: Leanna Battles, MD   Assessment & Plan: Visit Diagnoses:  1. Bilateral primary osteoarthritis of knee     Plan: Cortisone injection right knee.  Spent over 3 months since her last injection.  Prior films demonstrate osteoarthritis.  Follow-Up Instructions: Return if symptoms worsen or fail to improve.   Orders:  Orders Placed This Encounter  Procedures  . Large Joint Inj: R knee   No orders of the defined types were placed in this encounter.     Procedures: Large Joint Inj: R knee on 04/24/2020 1:32 PM Indications: pain and diagnostic evaluation Details: 25 G 1.5 in needle, anterolateral approach  Arthrogram: No  Medications: 2 mL bupivacaine 0.25 %  2 mL betamethasone injected with a Marcaine and 1 in the lateral compartment right knee Procedure, treatment alternatives, risks and benefits explained, specific risks discussed. Consent was given by the patient. Immediately prior to procedure a time out was called to verify the correct patient, procedure, equipment, support staff and site/side marked as required. Patient was prepped and draped in the usual sterile fashion.       Clinical Data: No additional findings.   Subjective: Chief Complaint  Patient presents with  . Right Knee - Pain  Patient presents today for right knee pain. She was last here in September of 2021 has received cortisone injections in her knees two weeks apart from each other. Pain returned in her right knee over a month ago. She is wanting to get another injection today. She said that her knee hurts upon getting up. No pain with rest. She takes Tylenol but states that it does not help much.   HPI  Review of Systems   Objective: Vital Signs: Ht 5\' 1"  (1.549 m)    Wt 150 lb (68 kg)   BMI 28.34 kg/m   Physical Exam Constitutional:      Appearance: She is well-developed and well-nourished.  HENT:     Mouth/Throat:     Mouth: Oropharynx is clear and moist.  Eyes:     Extraocular Movements: EOM normal.     Pupils: Pupils are equal, round, and reactive to light.  Pulmonary:     Effort: Pulmonary effort is normal.  Skin:    General: Skin is warm and dry.  Neurological:     Mental Status: She is alert and oriented to person, place, and time.  Psychiatric:        Mood and Affect: Mood and affect normal.        Behavior: Behavior normal.     Ortho Exam awake alert and oriented x3 comfortable sitting.  Right knee was not hot red warm or effused.  Mostly lateral joint pain with little bit of valgus with weightbearing.  Had full extension and flexed over 100 degrees.  No obvious instability no popliteal pain or mass mild patellar crepitation but no pain with patellar compression  Specialty Comments:  No specialty comments available.  Imaging: No results found.   PMFS History: Patient Active Problem List   Diagnosis Date Noted  . Bilateral primary osteoarthritis of knee 01/11/2020  . Advanced nonexudative age-related macular degeneration  of both eyes with subfoveal involvement 12/06/2019  . Exudative age-related macular degeneration of left eye with active choroidal neovascularization ([place]) 07/21/2019  . Exudative age-related macular degeneration of right eye with inactive choroidal neovascularization ([place]) 07/21/2019  . Cystoid macular edema of right eye 07/21/2019  . Posterior vitreous detachment of left eye 07/21/2019  . Cervical spondylosis without myelopathy 01/18/2019  . Spondylolisthesis of cervical region 01/18/2019  . Obstructive sleep apnea treated with continuous positive airway pressure (CPAP) 06/02/2018  . Unilateral primary osteoarthritis, right knee 03/02/2017  . Unilateral primary osteoarthritis, right knee 03/02/2017  .  Long term (current) use of anticoagulants 07/26/2010  . FATIGUE 05/23/2010  . HYPERCHOLESTEROLEMIA, PURE 02/13/2008  . HYPERTENSION, BENIGN 02/13/2008  . SVT/ PSVT/ PAT 02/13/2008  . ATRIAL FIBRILLATION 02/13/2008  . GERD 02/13/2008  . PALPITATIONS 02/13/2008   Past Medical History:  Diagnosis Date  . Atrial fibrillation ([facility])    ATRIAL FIBRILLATION S/P  PVI 6/11  . CAD (coronary artery disease)    a. 03/2002: cath showing 20% OM1 stenosis, EF at 65%, no WMA. No significant CAD.   Marland Kitchen GERD (gastroesophageal reflux disease)   . Hyperlipidemia   . Hypertension   . Macular degeneration   . SVT (supraventricular tachycardia) ([facility])     Family History  Problem Relation Age of Onset  . Stroke Other   . Coronary artery disease Other     Past Surgical History:  Procedure Laterality Date  . ABLATION OF DYSRHYTHMIC FOCUS  09/2009   s/p PVI by JA  . TOTAL ABDOMINAL HYSTERECTOMY     Social History   Occupational History  . Not on file  Tobacco Use  . Smoking status: Never Smoker  . Smokeless tobacco: Never Used  Vaping Use  . Vaping Use: Never used  Substance and Sexual Activity  . Alcohol use: No  . Drug use: No  . Sexual activity: Not on file

## 2020-05-08 ENCOUNTER — Ambulatory Visit (INDEPENDENT_AMBULATORY_CARE_PROVIDER_SITE_OTHER): Payer: Medicare Other | Admitting: Ophthalmology

## 2020-05-08 ENCOUNTER — Other Ambulatory Visit: Payer: Self-pay

## 2020-05-08 ENCOUNTER — Encounter (INDEPENDENT_AMBULATORY_CARE_PROVIDER_SITE_OTHER): Payer: Self-pay | Admitting: Ophthalmology

## 2020-05-08 DIAGNOSIS — H353221 Exudative age-related macular degeneration, left eye, with active choroidal neovascularization: Secondary | ICD-10-CM | POA: Diagnosis not present

## 2020-05-08 DIAGNOSIS — H353134 Nonexudative age-related macular degeneration, bilateral, advanced atrophic with subfoveal involvement: Secondary | ICD-10-CM

## 2020-05-08 MED ORDER — AFLIBERCEPT 2MG/0.05ML IZ SOLN FOR KALEIDOSCOPE
2.0000 mg | INTRAVITREAL | Status: AC | PRN
  Administered 2020-05-08: 2 mg via INTRAVITREAL

## 2020-05-08 NOTE — Progress Notes (Signed)
05/08/2020     CHIEF COMPLAINT Patient presents for Retina Follow Up (5 Week AMD F/U OS, poss Eylea OS//Pt sts "I haven't had too many good" days OS. Pt sts VA OS fluctuates, and some days are better than others. No changes OD.)   HISTORY OF PRESENT ILLNESS: Sylvia [name] is a 87 y.o. female who presents to the clinic today for:   HPI    Retina Follow Up    Patient presents with  Wet AMD.  In left eye.  This started 5 weeks ago.  Severity is mild.  Duration of 5 weeks.  Since onset it is stable. Additional comments: 5 Week AMD F/U OS, poss Eylea OS  Pt sts "I haven't had too many good" days OS. Pt sts VA OS fluctuates, and some days are better than others. No changes OD.       Last edited by Rockie Neighbours, [place] on 05/08/2020  1:28 PM. (History)      Referring physician: Leanna Battles, MD [place],  [place] [address]  HISTORICAL INFORMATION:   Selected notes from the MEDICAL RECORD NUMBER       CURRENT MEDICATIONS: Current Outpatient Medications (Ophthalmic Drugs)  Medication Sig  . hydroxypropyl methylcellulose / hypromellose (ISOPTO TEARS / GONIOVISC) 2.5 % ophthalmic solution Place 1 drop into both eyes as needed for dry eyes.   No current facility-administered medications for this visit. (Ophthalmic Drugs)   Current Outpatient Medications (Other)  Medication Sig  . acetaminophen (TYLENOL) 325 MG tablet Take by mouth every 6 (six) hours as needed for mild pain.  Marland Kitchen amLODipine (NORVASC) 2.5 MG tablet TAKE 2 TABLETS BY MOUTH IN THE MORNING AND 1 IN THE EVENING  . calcium citrate-vitamin D (CITRACAL+D) 315-200 MG-UNIT per tablet Take 1 tablet by mouth daily.  . diclofenac sodium (VOLTAREN) 1 % GEL Apply 4 g topically 4 (four) times daily.  . fluocinonide (LIDEX) 0.05 % external solution 2 (two) times daily. 1-2 GTTS IN EARS TWICE WEEKLY AS NEEDED   . levothyroxine (SYNTHROID, LEVOTHROID) 75 MCG tablet Take 75 mcg by mouth daily.    Marland Kitchen losartan (COZAAR)  100 MG tablet Take 100 mg by mouth daily.    . metoprolol tartrate (LOPRESSOR) 50 MG tablet Take 50 mg by mouth 2 (two) times daily.  . Multiple Vitamins-Minerals (OCUVITE PRESERVISION PO) Take 1 tablet by mouth 2 (two) times daily.  . pantoprazole (PROTONIX) 40 MG tablet Take 40 mg by mouth 2 (two) times daily.  . polyethylene glycol powder (GLYCOLAX/MIRALAX) powder Take 17 g by mouth daily. PRN    . predniSONE (DELTASONE) 20 MG tablet   . promethazine (PHENERGAN) 25 MG tablet Take 25 mg by mouth 3 (three) times daily as needed.  . raloxifene (EVISTA) 60 MG tablet Take 60 mg by mouth daily.    . sucralfate (CARAFATE) 1 GM/10ML suspension Take 10 mLs (1 g total) by mouth 4 (four) times daily -  with meals and at bedtime.  Marland Kitchen warfarin (COUMADIN) 5 MG tablet TAKE 1/2 TO 1 TABLET BY MOUTH ONCE DAILY AS DIRECTED BY COUMADIN CLINIC   No current facility-administered medications for this visit. (Other)      REVIEW OF SYSTEMS:    ALLERGIES Allergies  Allergen Reactions  . Metoclopramide Hcl     REACTION: nervous, climbs the wall  . Penicillins     REACTION: hives  . Pravastatin   . Sulfonamide Derivatives     REACTION: rash    PAST MEDICAL HISTORY  Past Medical History:  Diagnosis Date  . Atrial fibrillation ([place] [facility])    ATRIAL FIBRILLATION S/P  PVI 6/11  . CAD (coronary artery disease)    a. 03/2002: cath showing 20% OM1 stenosis, EF at 65%, no WMA. No significant CAD.   Marland Kitchen GERD (gastroesophageal reflux disease)   . Hyperlipidemia   . Hypertension   . Macular degeneration   . SVT (supraventricular tachycardia) ([facility])    Past Surgical History:  Procedure Laterality Date  . ABLATION OF DYSRHYTHMIC FOCUS  09/2009   s/p PVI by JA  . TOTAL ABDOMINAL HYSTERECTOMY      FAMILY HISTORY Family History  Problem Relation Age of Onset  . Stroke Other   . Coronary artery disease Other     SOCIAL HISTORY Social History   Tobacco Use  . Smoking status: Never Smoker  . Smokeless  tobacco: Never Used  Vaping Use  . Vaping Use: Never used  Substance Use Topics  . Alcohol use: No  . Drug use: No         OPHTHALMIC EXAM: Base Eye Exam    Visual Acuity (ETDRS)      Right Left   Dist cc CF @ 1' 20/60 +2   Dist ph cc NI 20/50 +1   Correction: Glasses       Tonometry (Tonopen, 1:28 PM)      Right Left   Pressure 11 15       Pupils      Pupils Dark Light Shape React APD   Right PERRL 2 2 Round Minimal None   Left PERRL 2 2 Round Minimal None       Visual Fields (Counting fingers)      Left Right    Full Full       Extraocular Movement      Right Left    Full Full       Neuro/Psych    Oriented x3: Yes   Mood/Affect: Normal       Dilation    Left eye: 1.0% Mydriacyl, 2.5% Phenylephrine @ 1:32 PM        Slit Lamp and Fundus Exam    External Exam      Right Left   External Normal Normal       Slit Lamp Exam      Right Left   Lids/Lashes Normal Normal   Conjunctiva/Sclera White and quiet White and quiet   Cornea Clear Clear   Anterior Chamber Deep and quiet Deep and quiet   Iris Round and reactive Round and reactive   Lens Posterior chamber intraocular lens Posterior chamber intraocular lens   Anterior Vitreous Normal Normal       Fundus Exam      Right Left   Posterior Vitreous  Posterior vitreous detachment   Disc  Normal   C/D Ratio  0.1   Macula  Age related macular degeneration, Atrophy into faz, advanced age related macular degeneration, Retinal pigment epithelial atrophy, Geographic atrophy in the FAZ,    Vessels  Normal   Periphery  Normal          IMAGING AND PROCEDURES  Imaging and Procedures for 05/08/20  OCT, Retina - OU - Both Eyes       Right Eye Quality was good. Scan locations included subfoveal. Central Foveal Thickness: 761. Progression has been stable. Findings include abnormal foveal contour, retinal drusen , cystoid macular edema, intraretinal fluid, disciform scar.   Left Eye Quality was good.  Scan  locations included subfoveal. Central Foveal Thickness: 260. Progression has been stable. Findings include abnormal foveal contour, retinal drusen .   Notes OS with much less subretinal fluid 5 weeks post intravitreal Avastin OS today , Repeat injection intravitreal Avastin OS today  OD with chronic subfoveal disciform scar, chronic CME intraretinal fluid, not treatable  Stable       Intravitreal Injection, Pharmacologic Agent - OS - Left Eye       Time Out 05/08/2020. 2:35 PM. Confirmed correct patient, procedure, site, and patient consented.   Anesthesia Topical anesthesia was used. Anesthetic medications included Akten 3.5%.   Procedure Preparation included Tobramycin 0.3%, Ofloxacin , 10% betadine to eyelids, 5% betadine to ocular surface. A 30 gauge needle was used.   Injection:  2 mg aflibercept Alfonse Flavors) SOLN   NDC: [id number], Lot: [id number]   Route: Intravitreal, Site: Left Eye, Waste: 0 mg  Post-op Post injection exam found visual acuity of at least counting fingers. The patient tolerated the procedure well. There were no complications. The patient received written and verbal post procedure care education. Post injection medications were not given.                 ASSESSMENT/PLAN:  Exudative age-related macular degeneration of left eye with active choroidal neovascularization ([place]) Much improved anatomy left eye on intravitreal Eylea, today at 5-week follow-up.  Notably geographic atrophy surrounds the fovea but the fovea remains free of active CNVM  Advanced nonexudative age-related macular degeneration of both eyes with subfoveal involvement Dry ARMD somewhat encroaching on the fovea left eye      ICD-10-CM   1. Exudative age-related macular degeneration of left eye with active choroidal neovascularization ([facility])  H35.3221 OCT, Retina - OU - Both Eyes    Intravitreal Injection, Pharmacologic Agent - OS - Left Eye    aflibercept (EYLEA) SOLN 2 mg   2. Advanced nonexudative age-related macular degeneration of both eyes with subfoveal involvement  H35.3134     1.  Much improved anatomy left eye with a history of multiple recurrences of CNVM with monocular patient.  Repeat intravitreal Eylea OS today at  5-week interval and repeat examination in 7 weeks  2.  Imitation of visual acuity left eye due to geographic atrophy surrounding the foveal avascular zone.  3.  Ophthalmic Meds Ordered this visit:  Meds ordered this encounter  Medications  . aflibercept (EYLEA) SOLN 2 mg       Return in about 7 weeks (around 06/26/2020) for dilate, OS, EYLEA OCT.  There are no Patient Instructions on file for this visit.   Explained the diagnoses, plan, and follow up with the patient and they expressed understanding.  Patient expressed understanding of the importance of proper follow up care.   Clent Demark Dayquan Buys M.D. Diseases & Surgery of the Retina and Vitreous Retina & Diabetic [place] 05/08/20     Abbreviations: M myopia (nearsighted); A astigmatism; H hyperopia (farsighted); P presbyopia; Mrx spectacle prescription;  CTL contact lenses; OD right eye; OS left eye; OU both eyes  XT exotropia; ET esotropia; PEK punctate epithelial keratitis; PEE punctate epithelial erosions; DES dry eye syndrome; MGD meibomian gland dysfunction; ATs artificial tears; PFAT's preservative free artificial tears; [place] nuclear sclerotic cataract; PSC posterior subcapsular cataract; ERM epi-retinal membrane; PVD posterior vitreous detachment; RD retinal detachment; DM diabetes mellitus; DR diabetic retinopathy; NPDR non-proliferative diabetic retinopathy; PDR proliferative diabetic retinopathy; CSME clinically significant macular edema; DME diabetic macular edema; dbh dot blot hemorrhages; CWS cotton wool spot; POAG  primary open angle glaucoma; C/D cup-to-disc ratio; HVF humphrey visual field; GVF goldmann visual field; OCT optical coherence tomography; IOP intraocular  pressure; BRVO Branch retinal vein occlusion; CRVO central retinal vein occlusion; CRAO central retinal artery occlusion; BRAO branch retinal artery occlusion; RT retinal tear; SB scleral buckle; PPV pars plana vitrectomy; VH Vitreous hemorrhage; PRP panretinal laser photocoagulation; IVK intravitreal kenalog; VMT vitreomacular traction; MH Macular hole;  NVD neovascularization of the disc; NVE neovascularization elsewhere; AREDS age related eye disease study; ARMD age related macular degeneration; POAG primary open angle glaucoma; EBMD epithelial/anterior basement membrane dystrophy; ACIOL anterior chamber intraocular lens; IOL intraocular lens; PCIOL posterior chamber intraocular lens; Phaco/IOL phacoemulsification with intraocular lens placement; [place] photorefractive keratectomy; LASIK laser assisted in situ keratomileusis; HTN hypertension; DM diabetes mellitus; COPD chronic obstructive pulmonary disease

## 2020-05-08 NOTE — Assessment & Plan Note (Signed)
Much improved anatomy left eye on intravitreal Eylea, today at 5-week follow-up.  Notably geographic atrophy surrounds the fovea but the fovea remains free of active CNVM

## 2020-05-08 NOTE — Assessment & Plan Note (Signed)
Dry ARMD somewhat encroaching on the fovea left eye

## 2020-05-17 DIAGNOSIS — I4891 Unspecified atrial fibrillation: Secondary | ICD-10-CM | POA: Diagnosis not present

## 2020-05-17 DIAGNOSIS — Z7901 Long term (current) use of anticoagulants: Secondary | ICD-10-CM | POA: Diagnosis not present

## 2020-06-14 DIAGNOSIS — I4891 Unspecified atrial fibrillation: Secondary | ICD-10-CM | POA: Diagnosis not present

## 2020-06-14 DIAGNOSIS — Z7901 Long term (current) use of anticoagulants: Secondary | ICD-10-CM | POA: Diagnosis not present

## 2020-06-26 ENCOUNTER — Ambulatory Visit (INDEPENDENT_AMBULATORY_CARE_PROVIDER_SITE_OTHER): Payer: Medicare Other | Admitting: Ophthalmology

## 2020-06-26 ENCOUNTER — Encounter (INDEPENDENT_AMBULATORY_CARE_PROVIDER_SITE_OTHER): Payer: Self-pay | Admitting: Ophthalmology

## 2020-06-26 ENCOUNTER — Other Ambulatory Visit: Payer: Self-pay

## 2020-06-26 DIAGNOSIS — H353221 Exudative age-related macular degeneration, left eye, with active choroidal neovascularization: Secondary | ICD-10-CM | POA: Diagnosis not present

## 2020-06-26 DIAGNOSIS — H353212 Exudative age-related macular degeneration, right eye, with inactive choroidal neovascularization: Secondary | ICD-10-CM | POA: Diagnosis not present

## 2020-06-26 DIAGNOSIS — H43812 Vitreous degeneration, left eye: Secondary | ICD-10-CM | POA: Diagnosis not present

## 2020-06-26 MED ORDER — AFLIBERCEPT 2MG/0.05ML IZ SOLN FOR KALEIDOSCOPE
2.0000 mg | INTRAVITREAL | Status: AC | PRN
  Administered 2020-06-26: 2 mg via INTRAVITREAL

## 2020-06-26 NOTE — Assessment & Plan Note (Signed)

## 2020-06-26 NOTE — Assessment & Plan Note (Signed)
Large disciform nontreatable right eye

## 2020-06-26 NOTE — Assessment & Plan Note (Signed)
Chronic active CNVM stabilized left eye on intravitreal Eylea currently at 7-week exam interval.  Repeat injection today and maintain exam interval of 7 weeks

## 2020-06-26 NOTE — Progress Notes (Signed)
06/26/2020     CHIEF COMPLAINT Patient presents for Retina Follow Up (7 Wk F/U OS, poss Eylea OS//Pt denies noticeable changes to [place] OU since last visit. Pt denies ocular pain, flashes of light, or floaters OU. //)   HISTORY OF PRESENT ILLNESS: Sylvia [name] is a 87 y.o. female who presents to the clinic today for:   HPI    Retina Follow Up    Patient presents with  Wet AMD.  In left eye.  This started 7 weeks ago.  Severity is mild.  Duration of 7 weeks.  Since onset it is stable. Additional comments: 7 Wk F/U OS, poss Eylea OS  Pt denies noticeable changes to [place] OU since last visit. Pt denies ocular pain, flashes of light, or floaters OU.          Last edited by Rockie Neighbours, [place] on 06/26/2020  1:53 PM. (History)      Referring physician: Leanna Battles, MD [place],  [place] [address]  HISTORICAL INFORMATION:   Selected notes from the MEDICAL RECORD NUMBER       CURRENT MEDICATIONS: Current Outpatient Medications (Ophthalmic Drugs)  Medication Sig  . hydroxypropyl methylcellulose / hypromellose (ISOPTO TEARS / GONIOVISC) 2.5 % ophthalmic solution Place 1 drop into both eyes as needed for dry eyes.   No current facility-administered medications for this visit. (Ophthalmic Drugs)   Current Outpatient Medications (Other)  Medication Sig  . acetaminophen (TYLENOL) 325 MG tablet Take by mouth every 6 (six) hours as needed for mild pain.  Marland Kitchen amLODipine (NORVASC) 2.5 MG tablet TAKE 2 TABLETS BY MOUTH IN THE MORNING AND 1 IN THE EVENING  . calcium citrate-vitamin D (CITRACAL+D) 315-200 MG-UNIT per tablet Take 1 tablet by mouth daily.  . diclofenac sodium (VOLTAREN) 1 % GEL Apply 4 g topically 4 (four) times daily.  . fluocinonide (LIDEX) 0.05 % external solution 2 (two) times daily. 1-2 GTTS IN EARS TWICE WEEKLY AS NEEDED   . levothyroxine (SYNTHROID, LEVOTHROID) 75 MCG tablet Take 75 mcg by mouth daily.    Marland Kitchen losartan (COZAAR) 100 MG tablet Take 100 mg by  mouth daily.    . metoprolol tartrate (LOPRESSOR) 50 MG tablet Take 50 mg by mouth 2 (two) times daily.  . Multiple Vitamins-Minerals (OCUVITE PRESERVISION PO) Take 1 tablet by mouth 2 (two) times daily.  . pantoprazole (PROTONIX) 40 MG tablet Take 40 mg by mouth 2 (two) times daily.  . polyethylene glycol powder (GLYCOLAX/MIRALAX) powder Take 17 g by mouth daily. PRN    . predniSONE (DELTASONE) 20 MG tablet   . promethazine (PHENERGAN) 25 MG tablet Take 25 mg by mouth 3 (three) times daily as needed.  . raloxifene (EVISTA) 60 MG tablet Take 60 mg by mouth daily.    . sucralfate (CARAFATE) 1 GM/10ML suspension Take 10 mLs (1 g total) by mouth 4 (four) times daily -  with meals and at bedtime.  Marland Kitchen warfarin (COUMADIN) 5 MG tablet TAKE 1/2 TO 1 TABLET BY MOUTH ONCE DAILY AS DIRECTED BY COUMADIN CLINIC   No current facility-administered medications for this visit. (Other)      REVIEW OF SYSTEMS:    ALLERGIES Allergies  Allergen Reactions  . Metoclopramide Hcl     REACTION: nervous, climbs the wall  . Penicillins     REACTION: hives  . Pravastatin   . Sulfonamide Derivatives     REACTION: rash    PAST MEDICAL HISTORY Past Medical History:  Diagnosis Date  .  Atrial fibrillation ([place])    ATRIAL FIBRILLATION S/P  PVI 6/11  . CAD (coronary artery disease)    a. 03/2002: cath showing 20% OM1 stenosis, EF at 65%, no WMA. No significant CAD.   Marland Kitchen GERD (gastroesophageal reflux disease)   . Hyperlipidemia   . Hypertension   . Macular degeneration   . SVT (supraventricular tachycardia) (HCC)    Past Surgical History:  Procedure Laterality Date  . ABLATION OF DYSRHYTHMIC FOCUS  09/2009   s/p PVI by JA  . TOTAL ABDOMINAL HYSTERECTOMY      FAMILY HISTORY Family History  Problem Relation Age of Onset  . Stroke Other   . Coronary artery disease Other     SOCIAL HISTORY Social History   Tobacco Use  . Smoking status: Never Smoker  . Smokeless tobacco: Never Used  Vaping Use   . Vaping Use: Never used  Substance Use Topics  . Alcohol use: No  . Drug use: No         OPHTHALMIC EXAM: Base Eye Exam    Visual Acuity (ETDRS)      Right Left   Dist cc CF @ face 20/60 +2   Dist ph cc NI NI   Correction: Glasses       Tonometry (Tonopen, 1:54 PM)      Right Left   Pressure 12 12       Pupils      Pupils Dark Light Shape React APD   Right PERRL 2 2 Round Minimal None   Left PERRL 2 2 Round Minimal None       Visual Fields (Counting fingers)      Left Right    Full Full       Extraocular Movement      Right Left    Full Full       Neuro/Psych    Oriented x3: Yes   Mood/Affect: Normal       Dilation    Left eye: 1.0% Mydriacyl, 2.5% Phenylephrine @ 1:57 PM        Slit Lamp and Fundus Exam    External Exam      Right Left   External Normal Normal       Slit Lamp Exam      Right Left   Lids/Lashes Normal Normal   Conjunctiva/Sclera White and quiet White and quiet   Cornea Clear Clear   Anterior Chamber Deep and quiet Deep and quiet   Iris Round and reactive Round and reactive   Lens Posterior chamber intraocular lens Posterior chamber intraocular lens   Anterior Vitreous Normal Normal       Fundus Exam      Right Left   Posterior Vitreous  Posterior vitreous detachment   Disc  Normal   C/D Ratio  0.1   Macula  Age related macular degeneration, Atrophy into faz, advanced age related macular degeneration, Retinal pigment epithelial atrophy, Geographic atrophy in the FAZ,    Vessels  Normal   Periphery  Normal          IMAGING AND PROCEDURES  Imaging and Procedures for 06/26/20  OCT, Retina - OU - Both Eyes       Right Eye Quality was good. Scan locations included subfoveal. Central Foveal Thickness: 863. Progression has been stable. Findings include abnormal foveal contour, retinal drusen , cystoid macular edema, intraretinal fluid, disciform scar.   Left Eye Quality was good. Scan locations included subfoveal.  Central Foveal Thickness: 257. Progression has  improved. Findings include abnormal foveal contour, retinal drusen .   Notes OS with much less subretinal fluid 5 weeks post intravitreal Avastin OS today , Repeat injection intravitreal Avastin OS today  OD with chronic subfoveal disciform scar, chronic CME intraretinal fluid, not treatable  Stable       Intravitreal Injection, Pharmacologic Agent - OS - Left Eye       Time Out 06/26/2020. 2:22 PM. Confirmed correct patient, procedure, site, and patient consented.   Anesthesia Topical anesthesia was used. Anesthetic medications included Akten 3.5%.   Procedure Preparation included Tobramycin 0.3%, Ofloxacin , 10% betadine to eyelids, 5% betadine to ocular surface. A 30 gauge needle was used.   Injection:  2 mg aflibercept Alfonse Flavors) SOLN   NDC: [id number], Lot: [id number]   Route: Intravitreal, Site: Left Eye, Waste: 0 mg  Post-op Post injection exam found visual acuity of at least counting fingers. The patient tolerated the procedure well. There were no complications. The patient received written and verbal post procedure care education. Post injection medications were not given.                 ASSESSMENT/PLAN:  Exudative age-related macular degeneration of left eye with active choroidal neovascularization (HCC) Chronic active CNVM stabilized left eye on intravitreal Eylea currently at 7-week exam interval.  Repeat injection today and maintain exam interval of 7 weeks   Exudative age-related macular degeneration of right eye with inactive choroidal neovascularization (HCC) Large disciform nontreatable right eye  Posterior vitreous detachment of left eye   The nature of posterior vitreous detachment was discussed with the patient as well as its physiology, its age prevalence, and its possible implication regarding retinal breaks and detachment.  An informational brochure was given to the patient.  All the patient's  questions were answered.  The patient was asked to return if new or different flashes or floaters develops.   Patient was instructed to contact office immediately if any changes were noticed. I explained to the patient that vitreous inside the eye is similar to jello inside a bowl. As the jello melts it can start to pull away from the bowl, similarly the vitreous throughout our lives can begin to pull away from the retina. That process is called a posterior vitreous detachment. In some cases, the vitreous can tug hard enough on the retina to form a retinal tear. I discussed with the patient the signs and symptoms of a retinal detachment.  Do not rub the eye.      ICD-10-CM   1. Exudative age-related macular degeneration of left eye with active choroidal neovascularization (HCC)  H35.3221 OCT, Retina - OU - Both Eyes    Intravitreal Injection, Pharmacologic Agent - OS - Left Eye    aflibercept (EYLEA) SOLN 2 mg  2. Exudative age-related macular degeneration of right eye with inactive choroidal neovascularization ([place])  H35.3212   3. Posterior vitreous detachment of left eye  H43.812     1.  OS, with wet ARMD, with multiple recurrences now stabilized on intravitreal Eylea and at 7-week interval.  Repeat injection today and examination again in 7 weeks  2.  Dilate OS next  3.  Ophthalmic Meds Ordered this visit:  Meds ordered this encounter  Medications  . aflibercept (EYLEA) SOLN 2 mg       Return in about 7 weeks (around 08/14/2020) for dilate, OS, EYLEA OCT.  There are no Patient Instructions on file for this visit.   Explained the diagnoses, plan, and follow  up with the patient and they expressed understanding.  Patient expressed understanding of the importance of proper follow up care.   Clent Demark Chelcey Caputo M.D. Diseases & Surgery of the Retina and Vitreous [facility] 06/26/20     Abbreviations: M myopia (nearsighted); A astigmatism; H hyperopia (farsighted); P  presbyopia; Mrx spectacle prescription;  CTL contact lenses; OD right eye; OS left eye; OU both eyes  XT exotropia; ET esotropia; PEK punctate epithelial keratitis; PEE punctate epithelial erosions; DES dry eye syndrome; MGD meibomian gland dysfunction; ATs artificial tears; PFAT's preservative free artificial tears; [place] nuclear sclerotic cataract; PSC posterior subcapsular cataract; ERM epi-retinal membrane; PVD posterior vitreous detachment; RD retinal detachment; DM diabetes mellitus; DR diabetic retinopathy; NPDR non-proliferative diabetic retinopathy; PDR proliferative diabetic retinopathy; CSME clinically significant macular edema; DME diabetic macular edema; dbh dot blot hemorrhages; CWS cotton wool spot; POAG primary open angle glaucoma; C/D cup-to-disc ratio; HVF humphrey visual field; GVF goldmann visual field; OCT optical coherence tomography; IOP intraocular pressure; BRVO Branch retinal vein occlusion; CRVO central retinal vein occlusion; CRAO central retinal artery occlusion; BRAO branch retinal artery occlusion; RT retinal tear; SB scleral buckle; PPV pars plana vitrectomy; VH Vitreous hemorrhage; PRP panretinal laser photocoagulation; IVK intravitreal kenalog; VMT vitreomacular traction; MH Macular hole;  NVD neovascularization of the disc; NVE neovascularization elsewhere; AREDS age related eye disease study; ARMD age related macular degeneration; POAG primary open angle glaucoma; EBMD epithelial/anterior basement membrane dystrophy; ACIOL anterior chamber intraocular lens; IOL intraocular lens; PCIOL posterior chamber intraocular lens; Phaco/IOL phacoemulsification with intraocular lens placement; [place] photorefractive keratectomy; LASIK laser assisted in situ keratomileusis; HTN hypertension; DM diabetes mellitus; COPD chronic obstructive pulmonary disease

## 2020-07-13 DIAGNOSIS — I4891 Unspecified atrial fibrillation: Secondary | ICD-10-CM | POA: Diagnosis not present

## 2020-07-13 DIAGNOSIS — Z7901 Long term (current) use of anticoagulants: Secondary | ICD-10-CM | POA: Diagnosis not present

## 2020-07-31 ENCOUNTER — Other Ambulatory Visit: Payer: Self-pay

## 2020-07-31 ENCOUNTER — Ambulatory Visit (INDEPENDENT_AMBULATORY_CARE_PROVIDER_SITE_OTHER): Payer: Medicare Other | Admitting: Orthopaedic Surgery

## 2020-07-31 ENCOUNTER — Encounter: Payer: Self-pay | Admitting: Orthopaedic Surgery

## 2020-07-31 DIAGNOSIS — M170 Bilateral primary osteoarthritis of knee: Secondary | ICD-10-CM

## 2020-07-31 DIAGNOSIS — M1711 Unilateral primary osteoarthritis, right knee: Secondary | ICD-10-CM | POA: Diagnosis not present

## 2020-07-31 MED ORDER — METHYLPREDNISOLONE ACETATE 40 MG/ML IJ SUSP
80.0000 mg | INTRAMUSCULAR | Status: AC | PRN
  Administered 2020-07-31: 80 mg via INTRA_ARTICULAR

## 2020-07-31 MED ORDER — LIDOCAINE HCL 1 % IJ SOLN
2.0000 mL | INTRAMUSCULAR | Status: AC | PRN
  Administered 2020-07-31: 2 mL

## 2020-07-31 MED ORDER — BUPIVACAINE HCL 0.25 % IJ SOLN
2.0000 mL | INTRAMUSCULAR | Status: AC | PRN
  Administered 2020-07-31: 2 mL via INTRA_ARTICULAR

## 2020-07-31 NOTE — Progress Notes (Signed)
Office Visit Note   Patient: Sylvia [name]           Date of Birth: [birth year]           MRN: [id number] Visit Date: 07/31/2020              Requested by: Leanna Battles, MD [place],  [place] [id number] PCP: Leanna Battles, MD   Assessment & Plan: Visit Diagnoses:  1. Bilateral primary osteoarthritis of knee     Plan: Recurrent symptoms of osteoarthritis right knee.  Has done well with cortisone in the past.  She like to have another injection today.  Will inject her right knee with Depo-Medrol and have her return in 2 weeks to consider injecting the left knee  Follow-Up Instructions: Return in about 2 weeks (around 08/14/2020).   Orders:  Orders Placed This Encounter  Procedures  . Large Joint Inj: R knee   No orders of the defined types were placed in this encounter.     Procedures: Large Joint Inj: R knee on 07/31/2020 3:07 PM Indications: pain and diagnostic evaluation Details: 25 G 1.5 in needle, anteromedial approach  Arthrogram: No  Medications: 2 mL lidocaine 1 %; 80 mg methylPREDNISolone acetate 40 MG/ML; 2 mL bupivacaine 0.25 % Procedure, treatment alternatives, risks and benefits explained, specific risks discussed. Consent was given by the patient. Immediately prior to procedure a time out was called to verify the correct patient, procedure, equipment, support staff and site/side marked as required. Patient was prepped and draped in the usual sterile fashion.       Clinical Data: No additional findings.   Subjective: Chief Complaint  Patient presents with  . Right Knee - Pain  Patient presents today for her right knee. She was last here and received a cortisone injection in January of 2022. She states that the injection lasted a few weeks. She wants to get another one today.   HPI  Review of Systems   Objective: Vital Signs: There were no vitals taken for this visit.  Physical Exam Constitutional:      Appearance: She is  well-developed.  Eyes:     Pupils: Pupils are equal, round, and reactive to light.  Pulmonary:     Effort: Pulmonary effort is normal.  Skin:    General: Skin is warm and dry.  Neurological:     Mental Status: She is alert and oriented to person, place, and time.  Psychiatric:        Behavior: Behavior normal.     Ortho Exam awake alert and oriented x3.  Comfortable sitting right knee was not hot red warm or swollen.  I do not think she has an effusion.  Mostly medial joint pain.  There is some patella crepitation but no pain with compression.  No instability.  Full extension of flexed about 102 degrees  Specialty Comments:  No specialty comments available.  Imaging: No results found.   PMFS History: Patient Active Problem List   Diagnosis Date Noted  . Bilateral primary osteoarthritis of knee 01/11/2020  . Advanced nonexudative age-related macular degeneration of both eyes with subfoveal involvement 12/06/2019  . Exudative age-related macular degeneration of left eye with active choroidal neovascularization ([place]) 07/21/2019  . Exudative age-related macular degeneration of right eye with inactive choroidal neovascularization ([place]) 07/21/2019  . Cystoid macular edema of right eye 07/21/2019  . Posterior vitreous detachment of left eye 07/21/2019  . Cervical spondylosis without myelopathy 01/18/2019  . Spondylolisthesis of cervical region  01/18/2019  . Obstructive sleep apnea treated with continuous positive airway pressure (CPAP) 06/02/2018  . Unilateral primary osteoarthritis, right knee 03/02/2017  . Unilateral primary osteoarthritis, right knee 03/02/2017  . Long term (current) use of anticoagulants 07/26/2010  . FATIGUE 05/23/2010  . HYPERCHOLESTEROLEMIA, PURE 02/13/2008  . HYPERTENSION, BENIGN 02/13/2008  . SVT/ PSVT/ PAT 02/13/2008  . ATRIAL FIBRILLATION 02/13/2008  . GERD 02/13/2008  . PALPITATIONS 02/13/2008   Past Medical History:  Diagnosis Date  . Atrial  fibrillation ([place])    ATRIAL FIBRILLATION S/P  PVI 6/11  . CAD (coronary artery disease)    a. 03/2002: cath showing 20% OM1 stenosis, EF at 65%, no WMA. No significant CAD.   Marland Kitchen GERD (gastroesophageal reflux disease)   . Hyperlipidemia   . Hypertension   . Macular degeneration   . SVT (supraventricular tachycardia) (HCC)     Family History  Problem Relation Age of Onset  . Stroke Other   . Coronary artery disease Other     Past Surgical History:  Procedure Laterality Date  . ABLATION OF DYSRHYTHMIC FOCUS  09/2009   s/p PVI by JA  . TOTAL ABDOMINAL HYSTERECTOMY     Social History   Occupational History  . Not on file  Tobacco Use  . Smoking status: Never Smoker  . Smokeless tobacco: Never Used  Vaping Use  . Vaping Use: Never used  Substance and Sexual Activity  . Alcohol use: No  . Drug use: No  . Sexual activity: Not on file

## 2020-08-13 DIAGNOSIS — I4891 Unspecified atrial fibrillation: Secondary | ICD-10-CM | POA: Diagnosis not present

## 2020-08-13 DIAGNOSIS — Z7901 Long term (current) use of anticoagulants: Secondary | ICD-10-CM | POA: Diagnosis not present

## 2020-08-14 ENCOUNTER — Encounter (INDEPENDENT_AMBULATORY_CARE_PROVIDER_SITE_OTHER): Payer: Medicare Other | Admitting: Ophthalmology

## 2020-08-14 ENCOUNTER — Ambulatory Visit: Payer: Medicare Other | Admitting: Orthopaedic Surgery

## 2020-08-15 ENCOUNTER — Encounter (INDEPENDENT_AMBULATORY_CARE_PROVIDER_SITE_OTHER): Payer: Self-pay | Admitting: Ophthalmology

## 2020-08-15 ENCOUNTER — Other Ambulatory Visit: Payer: Self-pay

## 2020-08-15 ENCOUNTER — Ambulatory Visit (INDEPENDENT_AMBULATORY_CARE_PROVIDER_SITE_OTHER): Payer: Medicare Other | Admitting: Ophthalmology

## 2020-08-15 DIAGNOSIS — H353134 Nonexudative age-related macular degeneration, bilateral, advanced atrophic with subfoveal involvement: Secondary | ICD-10-CM

## 2020-08-15 DIAGNOSIS — H353212 Exudative age-related macular degeneration, right eye, with inactive choroidal neovascularization: Secondary | ICD-10-CM | POA: Diagnosis not present

## 2020-08-15 DIAGNOSIS — H353221 Exudative age-related macular degeneration, left eye, with active choroidal neovascularization: Secondary | ICD-10-CM | POA: Diagnosis not present

## 2020-08-15 MED ORDER — BEVACIZUMAB 2.5 MG/0.1ML IZ SOSY
2.5000 mg | PREFILLED_SYRINGE | INTRAVITREAL | Status: AC | PRN
  Administered 2020-08-15: 2.5 mg via INTRAVITREAL

## 2020-08-15 NOTE — Assessment & Plan Note (Signed)
Improved and stable again at 7-week interval as compared to 5 weeks previous.  We will repeat injection today and maintain 7 to 8-week follow-up interval

## 2020-08-15 NOTE — Assessment & Plan Note (Signed)
Perifoveal atrophy present in each eye

## 2020-08-15 NOTE — Progress Notes (Signed)
08/15/2020     CHIEF COMPLAINT Patient presents for Retina Follow Up (7week fu OS. Possible Eylea OS.//Pt states VA OU stable since last visit. Pt denies FOL, floaters, or ocular pain OU./)   HISTORY OF PRESENT ILLNESS: Sylvia [name] is a 87 y.o. female who presents to the clinic today for:   HPI    Retina Follow Up    Diagnosis: Wet AMD   Laterality: left eye   Onset: 7 weeks ago   Duration: 7 weeks   Course: stable   Comments: 7week fu OS. Possible Eylea OS.  Pt states VA OU stable since last visit. Pt denies FOL, floaters, or ocular pain OU.        Last edited by Kendra Opitz, [place] on 08/15/2020  1:23 PM. (History)      Referring physician: Leanna Battles, MD [place],  [place] [id number]  HISTORICAL INFORMATION:   Selected notes from the MEDICAL RECORD NUMBER       CURRENT MEDICATIONS: Current Outpatient Medications (Ophthalmic Drugs)  Medication Sig  . hydroxypropyl methylcellulose / hypromellose (ISOPTO TEARS / GONIOVISC) 2.5 % ophthalmic solution Place 1 drop into both eyes as needed for dry eyes.   No current facility-administered medications for this visit. (Ophthalmic Drugs)   Current Outpatient Medications (Other)  Medication Sig  . acetaminophen (TYLENOL) 325 MG tablet Take by mouth every 6 (six) hours as needed for mild pain.  Marland Kitchen amLODipine (NORVASC) 2.5 MG tablet TAKE 2 TABLETS BY MOUTH IN THE MORNING AND 1 IN THE EVENING  . calcium citrate-vitamin D (CITRACAL+D) 315-200 MG-UNIT per tablet Take 1 tablet by mouth daily.  . diclofenac sodium (VOLTAREN) 1 % GEL Apply 4 g topically 4 (four) times daily.  . fluocinonide (LIDEX) 0.05 % external solution 2 (two) times daily. 1-2 GTTS IN EARS TWICE WEEKLY AS NEEDED   . levothyroxine (SYNTHROID, LEVOTHROID) 75 MCG tablet Take 75 mcg by mouth daily.    Marland Kitchen losartan (COZAAR) 100 MG tablet Take 100 mg by mouth daily.    . metoprolol tartrate (LOPRESSOR) 50 MG tablet Take 50 mg by mouth 2 (two) times  daily.  . Multiple Vitamins-Minerals (OCUVITE PRESERVISION PO) Take 1 tablet by mouth 2 (two) times daily.  . pantoprazole (PROTONIX) 40 MG tablet Take 40 mg by mouth 2 (two) times daily.  . polyethylene glycol powder (GLYCOLAX/MIRALAX) powder Take 17 g by mouth daily. PRN    . predniSONE (DELTASONE) 20 MG tablet   . promethazine (PHENERGAN) 25 MG tablet Take 25 mg by mouth 3 (three) times daily as needed.  . raloxifene (EVISTA) 60 MG tablet Take 60 mg by mouth daily.    . sucralfate (CARAFATE) 1 GM/10ML suspension Take 10 mLs (1 g total) by mouth 4 (four) times daily -  with meals and at bedtime.  Marland Kitchen warfarin (COUMADIN) 5 MG tablet TAKE 1/2 TO 1 TABLET BY MOUTH ONCE DAILY AS DIRECTED BY COUMADIN CLINIC   No current facility-administered medications for this visit. (Other)      REVIEW OF SYSTEMS:    ALLERGIES Allergies  Allergen Reactions  . Metoclopramide Hcl     REACTION: nervous, climbs the wall  . Penicillins     REACTION: hives  . Pravastatin   . Sulfonamide Derivatives     REACTION: rash    PAST MEDICAL HISTORY Past Medical History:  Diagnosis Date  . Atrial fibrillation ([place])    ATRIAL FIBRILLATION S/P  PVI 6/11  . CAD (coronary artery disease)  a. 03/2002: cath showing 20% OM1 stenosis, EF at 65%, no WMA. No significant CAD.   Marland Kitchen GERD (gastroesophageal reflux disease)   . Hyperlipidemia   . Hypertension   . Macular degeneration   . SVT (supraventricular tachycardia) (HCC)    Past Surgical History:  Procedure Laterality Date  . ABLATION OF DYSRHYTHMIC FOCUS  09/2009   s/p PVI by JA  . TOTAL ABDOMINAL HYSTERECTOMY      FAMILY HISTORY Family History  Problem Relation Age of Onset  . Stroke Other   . Coronary artery disease Other     SOCIAL HISTORY Social History   Tobacco Use  . Smoking status: Never Smoker  . Smokeless tobacco: Never Used  Vaping Use  . Vaping Use: Never used  Substance Use Topics  . Alcohol use: No  . Drug use: No          OPHTHALMIC EXAM: Base Eye Exam    Visual Acuity (ETDRS)      Right Left   Dist cc CF at face 20/60   Dist ph cc NI NI   Correction: Glasses       Tonometry (Tonopen, 1:27 PM)      Right Left   Pressure 14 13       Pupils      Pupils Dark Light Shape React APD   Right PERRL 2 2 Round Minimal None   Left PERRL 2 2 Round Minimal None       Visual Fields (Counting fingers)      Left Right    Full Full       Extraocular Movement      Right Left    Full Full       Neuro/Psych    Oriented x3: Yes   Mood/Affect: Normal       Dilation    Left eye: 1.0% Mydriacyl, 2.5% Phenylephrine @ 1:27 PM        Slit Lamp and Fundus Exam    External Exam      Right Left   External Normal Normal       Slit Lamp Exam      Right Left   Lids/Lashes Normal Normal   Conjunctiva/Sclera White and quiet White and quiet   Cornea Clear Clear   Anterior Chamber Deep and quiet Deep and quiet   Iris Round and reactive Round and reactive   Lens Posterior chamber intraocular lens Posterior chamber intraocular lens   Anterior Vitreous Normal Normal       Fundus Exam      Right Left   Posterior Vitreous  Posterior vitreous detachment   Disc  Normal   C/D Ratio  0.1   Macula  Age related macular degeneration, Atrophy into faz, advanced age related macular degeneration, Retinal pigment epithelial atrophy, Geographic atrophy in the FAZ,    Vessels  Normal   Periphery  Normal          IMAGING AND PROCEDURES  Imaging and Procedures for 08/15/20  OCT, Retina - OU - Both Eyes       Right Eye Quality was good. Scan locations included subfoveal. Central Foveal Thickness: 822. Progression has been stable. Findings include abnormal foveal contour, retinal drusen , cystoid macular edema, intraretinal fluid, disciform scar.   Left Eye Quality was good. Scan locations included subfoveal. Central Foveal Thickness: 244. Progression has improved. Findings include abnormal foveal contour,  retinal drusen .   Notes OS with much less subretinal fluid 7 weeks post intravitreal  Avastin OS today , Repeat injection intravitreal Avastin OS today  OD with chronic subfoveal disciform scar, chronic CME intraretinal fluid, not treatable  Stable       Intravitreal Injection, Pharmacologic Agent - OS - Left Eye       Time Out 08/15/2020. 1:59 PM. Confirmed correct patient, procedure, site, and patient consented.   Anesthesia Topical anesthesia was used. Anesthetic medications included Akten 3.5%.   Procedure Preparation included Tobramycin 0.3%, Ofloxacin , 10% betadine to eyelids, 5% betadine to ocular surface. A 30 gauge needle was used.   Injection:  2.5 mg Bevacizumab (AVASTIN) 2.5mg /0.11mL SOSY   NDC: O3169984, [id number]   Route: Intravitreal, Site: Left Eye  Post-op Post injection exam found visual acuity of at least counting fingers. The patient tolerated the procedure well. There were no complications. The patient received written and verbal post procedure care education. Post injection medications were not given.                 ASSESSMENT/PLAN:  Exudative age-related macular degeneration of right eye with inactive choroidal neovascularization (HCC) Chronic disciform stable overall  Exudative age-related macular degeneration of left eye with active choroidal neovascularization (HCC) Improved and stable again at 7-week interval as compared to 5 weeks previous.  We will repeat injection today and maintain 7 to 8-week follow-up interval  Advanced nonexudative age-related macular degeneration of both eyes with subfoveal involvement Perifoveal atrophy present in each eye      ICD-10-CM   1. Exudative age-related macular degeneration of left eye with active choroidal neovascularization (HCC)  H35.3221 OCT, Retina - OU - Both Eyes    Intravitreal Injection, Pharmacologic Agent - OS - Left Eye    bevacizumab (AVASTIN) SOSY 2.5 mg  2. Exudative  age-related macular degeneration of right eye with inactive choroidal neovascularization ([place])  H35.3212   3. Advanced nonexudative age-related macular degeneration of both eyes with subfoveal involvement  H35.3134     1.  Dry atrophic AMD OD with chronic disciform fovea OD accounts for acuity  2.  OS with perifoveal atrophy yet subfoveal CNVM in the past now stabilized on intravitreal Avastin, at 7-week interval follow-up today will repeat injection today in 7 weeks examination left eye again  3.  Ophthalmic Meds Ordered this visit:  Meds ordered this encounter  Medications  . bevacizumab (AVASTIN) SOSY 2.5 mg       Return in about 7 weeks (around 10/03/2020) for DILATE OU, AVASTIN OCT, OS.  There are no Patient Instructions on file for this visit.   Explained the diagnoses, plan, and follow up with the patient and they expressed understanding.  Patient expressed understanding of the importance of proper follow up care.   Clent Demark Zivah Mayr M.D. Diseases & Surgery of the Retina and Vitreous Retina & Diabetic [place] 08/15/20     Abbreviations: M myopia (nearsighted); A astigmatism; H hyperopia (farsighted); P presbyopia; Mrx spectacle prescription;  CTL contact lenses; OD right eye; OS left eye; OU both eyes  XT exotropia; ET esotropia; PEK punctate epithelial keratitis; PEE punctate epithelial erosions; DES dry eye syndrome; MGD meibomian gland dysfunction; ATs artificial tears; PFAT's preservative free artificial tears; [place] nuclear sclerotic cataract; PSC posterior subcapsular cataract; ERM epi-retinal membrane; PVD posterior vitreous detachment; RD retinal detachment; DM diabetes mellitus; DR diabetic retinopathy; NPDR non-proliferative diabetic retinopathy; PDR proliferative diabetic retinopathy; CSME clinically significant macular edema; DME diabetic macular edema; dbh dot blot hemorrhages; CWS cotton wool spot; POAG primary open angle glaucoma; C/D cup-to-disc ratio;  HVF  humphrey visual field; GVF goldmann visual field; OCT optical coherence tomography; IOP intraocular pressure; BRVO Branch retinal vein occlusion; CRVO central retinal vein occlusion; CRAO central retinal artery occlusion; BRAO branch retinal artery occlusion; RT retinal tear; SB scleral buckle; PPV pars plana vitrectomy; VH Vitreous hemorrhage; PRP panretinal laser photocoagulation; IVK intravitreal kenalog; VMT vitreomacular traction; MH Macular hole;  NVD neovascularization of the disc; NVE neovascularization elsewhere; AREDS age related eye disease study; ARMD age related macular degeneration; POAG primary open angle glaucoma; EBMD epithelial/anterior basement membrane dystrophy; ACIOL anterior chamber intraocular lens; IOL intraocular lens; PCIOL posterior chamber intraocular lens; Phaco/IOL phacoemulsification with intraocular lens placement; [place] photorefractive keratectomy; LASIK laser assisted in situ keratomileusis; HTN hypertension; DM diabetes mellitus; COPD chronic obstructive pulmonary disease

## 2020-08-15 NOTE — Assessment & Plan Note (Signed)
Chronic disciform stable overall

## 2020-08-16 ENCOUNTER — Ambulatory Visit: Payer: Medicare Other | Admitting: Orthopaedic Surgery

## 2020-09-14 DIAGNOSIS — I4891 Unspecified atrial fibrillation: Secondary | ICD-10-CM | POA: Diagnosis not present

## 2020-09-14 DIAGNOSIS — Z7901 Long term (current) use of anticoagulants: Secondary | ICD-10-CM | POA: Diagnosis not present

## 2020-10-02 ENCOUNTER — Encounter (INDEPENDENT_AMBULATORY_CARE_PROVIDER_SITE_OTHER): Payer: Medicare Other | Admitting: Ophthalmology

## 2020-10-04 ENCOUNTER — Encounter (INDEPENDENT_AMBULATORY_CARE_PROVIDER_SITE_OTHER): Payer: Medicare Other | Admitting: Ophthalmology

## 2020-10-09 ENCOUNTER — Encounter (INDEPENDENT_AMBULATORY_CARE_PROVIDER_SITE_OTHER): Payer: Medicare Other | Admitting: Ophthalmology

## 2020-10-11 DIAGNOSIS — I4891 Unspecified atrial fibrillation: Secondary | ICD-10-CM | POA: Diagnosis not present

## 2020-10-11 DIAGNOSIS — Z7901 Long term (current) use of anticoagulants: Secondary | ICD-10-CM | POA: Diagnosis not present

## 2020-10-23 ENCOUNTER — Encounter (INDEPENDENT_AMBULATORY_CARE_PROVIDER_SITE_OTHER): Payer: Medicare Other | Admitting: Ophthalmology

## 2020-10-23 ENCOUNTER — Ambulatory Visit (INDEPENDENT_AMBULATORY_CARE_PROVIDER_SITE_OTHER): Payer: Medicare Other | Admitting: Ophthalmology

## 2020-10-23 ENCOUNTER — Encounter (INDEPENDENT_AMBULATORY_CARE_PROVIDER_SITE_OTHER): Payer: Self-pay | Admitting: Ophthalmology

## 2020-10-23 ENCOUNTER — Other Ambulatory Visit: Payer: Self-pay

## 2020-10-23 DIAGNOSIS — H353221 Exudative age-related macular degeneration, left eye, with active choroidal neovascularization: Secondary | ICD-10-CM

## 2020-10-23 DIAGNOSIS — H353134 Nonexudative age-related macular degeneration, bilateral, advanced atrophic with subfoveal involvement: Secondary | ICD-10-CM | POA: Diagnosis not present

## 2020-10-23 DIAGNOSIS — H353212 Exudative age-related macular degeneration, right eye, with inactive choroidal neovascularization: Secondary | ICD-10-CM

## 2020-10-23 MED ORDER — AFLIBERCEPT 2MG/0.05ML IZ SOLN FOR KALEIDOSCOPE
2.0000 mg | INTRAVITREAL | Status: AC | PRN
  Administered 2020-10-23: 2 mg via INTRAVITREAL

## 2020-10-23 NOTE — Progress Notes (Signed)
10/23/2020     CHIEF COMPLAINT Patient presents for Retina Follow Up (9week 6 days fu OU and Eylea OS (appt had to r/s)/Pt states, "I am not seeing well at all but I think it is because I am behind on my injections")   HISTORY OF PRESENT ILLNESS: Sylvia [name] is a 87 y.o. female who presents to the clinic today for:   HPI     Retina Follow Up           Diagnosis: Wet AMD   Laterality: left eye   Onset: 7 weeks ago   Severity: mild   Duration: 7 weeks   Course: gradually worsening   Comments: 9week 6 days fu OU and Eylea OS (appt had to r/s) Pt states, "I am not seeing well at all but I think it is because I am behind on my injections"         Comments   Patient scheduled for follow-up in 6 to 7 weeks, now at 9 weeks after exposure to [place].  Patient notes a slight decline in vision acuity in the left eye      Last edited by Hurman Horn, MD on 10/23/2020  2:42 PM.      Referring physician: Leanna Battles, MD [place],   [id number]  HISTORICAL INFORMATION:   Selected notes from the MEDICAL RECORD NUMBER       CURRENT MEDICATIONS: Current Outpatient Medications (Ophthalmic Drugs)  Medication Sig   hydroxypropyl methylcellulose / hypromellose (ISOPTO TEARS / GONIOVISC) 2.5 % ophthalmic solution Place 1 drop into both eyes as needed for dry eyes.   No current facility-administered medications for this visit. (Ophthalmic Drugs)   Current Outpatient Medications (Other)  Medication Sig   acetaminophen (TYLENOL) 325 MG tablet Take by mouth every 6 (six) hours as needed for mild pain.   amLODipine (NORVASC) 2.5 MG tablet TAKE 2 TABLETS BY MOUTH IN THE MORNING AND 1 IN THE EVENING   calcium citrate-vitamin D (CITRACAL+D) 315-200 MG-UNIT per tablet Take 1 tablet by mouth daily.   diclofenac sodium (VOLTAREN) 1 % GEL Apply 4 g topically 4 (four) times daily.   fluocinonide (LIDEX) 0.05 % external solution 2 (two) times daily. 1-2 GTTS IN EARS  TWICE WEEKLY AS NEEDED    levothyroxine (SYNTHROID, LEVOTHROID) 75 MCG tablet Take 75 mcg by mouth daily.     losartan (COZAAR) 100 MG tablet Take 100 mg by mouth daily.     metoprolol tartrate (LOPRESSOR) 50 MG tablet Take 50 mg by mouth 2 (two) times daily.   Multiple Vitamins-Minerals (OCUVITE PRESERVISION PO) Take 1 tablet by mouth 2 (two) times daily.   pantoprazole (PROTONIX) 40 MG tablet Take 40 mg by mouth 2 (two) times daily.   polyethylene glycol powder (GLYCOLAX/MIRALAX) powder Take 17 g by mouth daily. PRN     predniSONE (DELTASONE) 20 MG tablet    promethazine (PHENERGAN) 25 MG tablet Take 25 mg by mouth 3 (three) times daily as needed.   raloxifene (EVISTA) 60 MG tablet Take 60 mg by mouth daily.     sucralfate (CARAFATE) 1 GM/10ML suspension Take 10 mLs (1 g total) by mouth 4 (four) times daily -  with meals and at bedtime.   warfarin (COUMADIN) 5 MG tablet TAKE 1/2 TO 1 TABLET BY MOUTH ONCE DAILY AS DIRECTED BY COUMADIN CLINIC   No current facility-administered medications for this visit. (Other)      REVIEW OF SYSTEMS:    ALLERGIES Allergies  Allergen Reactions   Metoclopramide Hcl     REACTION: nervous, climbs the wall   Penicillins     REACTION: hives   Pravastatin    Sulfonamide Derivatives     REACTION: rash    PAST MEDICAL HISTORY Past Medical History:  Diagnosis Date   Atrial fibrillation ([place])    ATRIAL FIBRILLATION S/P  PVI 6/11   CAD (coronary artery disease)    a. 03/2002: cath showing 20% OM1 stenosis, EF at 65%, no WMA. No significant CAD.    GERD (gastroesophageal reflux disease)    Hyperlipidemia    Hypertension    Macular degeneration    SVT (supraventricular tachycardia) (HCC)    Past Surgical History:  Procedure Laterality Date   ABLATION OF DYSRHYTHMIC FOCUS  09/2009   s/p PVI by JA   TOTAL ABDOMINAL HYSTERECTOMY      FAMILY HISTORY Family History  Problem Relation Age of Onset   Stroke Other    Coronary artery disease Other      SOCIAL HISTORY Social History   Tobacco Use   Smoking status: Never   Smokeless tobacco: Never  Vaping Use   Vaping Use: Never used  Substance Use Topics   Alcohol use: No   Drug use: No         OPHTHALMIC EXAM:  Base Eye Exam     Visual Acuity (ETDRS)       Right Left   Dist cc HM 20/100 -1         Tonometry (Tonopen, 2:30 PM)       Right Left   Pressure 11 14         Pupils       Pupils Dark Light Shape React APD   Right PERRL 2 2 Round Minimal None   Left PERRL 2 2 Round Minimal None         Visual Fields (Counting fingers)       Left Right    Full Full         Extraocular Movement       Right Left    Full Full         Neuro/Psych     Oriented x3: Yes   Mood/Affect: Normal         Dilation     Both eyes: 1.0% Mydriacyl, 2.5% Phenylephrine @ 2:31 PM           Slit Lamp and Fundus Exam     External Exam       Right Left   External Normal Normal         Slit Lamp Exam       Right Left   Lids/Lashes Normal Normal   Conjunctiva/Sclera White and quiet White and quiet   Cornea Clear Clear   Anterior Chamber Deep and quiet Deep and quiet   Iris Round and reactive Round and reactive   Lens Posterior chamber intraocular lens Posterior chamber intraocular lens   Anterior Vitreous Normal Normal         Fundus Exam       Right Left   Posterior Vitreous Posterior vitreous detachment Posterior vitreous detachment   Disc Normal Normal   C/D Ratio 0.1 0.1   Macula Disciform scar, Cystoid macular edema, Advanced age related macular degeneration, Retinal pigment epithelial atrophy Age related macular degeneration, Atrophy into faz, advanced age related macular degeneration, Retinal pigment epithelial atrophy, Geographic atrophy in the FAZ,    Vessels Normal Normal   Periphery  Normal Normal            IMAGING AND PROCEDURES  Imaging and Procedures for 10/23/20  OCT, Retina - OU - Both Eyes       Right  Eye Quality was good. Scan locations included subfoveal. Central Foveal Thickness: 822. Progression has been stable. Findings include abnormal foveal contour, retinal drusen , cystoid macular edema, intraretinal fluid, disciform scar.   Left Eye Quality was good. Scan locations included subfoveal. Central Foveal Thickness: 456. Progression has improved. Findings include abnormal foveal contour, retinal drusen , cystoid macular edema, intraretinal hyper-reflective material.   Notes At 9-week follow-up today, increased CME increased fluid and increased thickness left eye from reactivation of CNVM.  Will need repeat Eylea today and examination again in 5 to 6-week  OD with chronic subfoveal disciform scar, chronic CME intraretinal fluid, not treatable  Stable     Intravitreal Injection, Pharmacologic Agent - OS - Left Eye       Time Out 10/23/2020. 2:42 PM. Confirmed correct patient, procedure, site, and patient consented.   Anesthesia Topical anesthesia was used. Anesthetic medications included Akten 3.5%.   Procedure Preparation included Tobramycin 0.3%, Ofloxacin , 10% betadine to eyelids, 5% betadine to ocular surface. A 30 gauge needle was used.   Injection: 2 mg aflibercept 2 MG/0.05ML   Route: Intravitreal, Site: Left Eye   NDC: A3590391, Lot: [id number], Waste: 0 mL   Post-op Post injection exam found visual acuity of at least counting fingers. The patient tolerated the procedure well. There were no complications. The patient received written and verbal post procedure care education. Post injection medications were not given.              ASSESSMENT/PLAN:  Exudative age-related macular degeneration of right eye with inactive choroidal neovascularization ([place]) Large old disciform scar with chronic CME atrophy in the macula not treatable  Exudative age-related macular degeneration of left eye with active choroidal neovascularization (HCC) Left eye reactivated  CNVM with intraretinal fluid thickening and CME at slightly over 9-week interval due to COVID restrictions and exposure.  Repeat intravitreal Eylea OS today and examination next in 5 to 6 weeks left eye will likely injection OS  Advanced nonexudative age-related macular degeneration of both eyes with subfoveal involvement The nature of dry age related macular degeneration was discussed with the patient as well as its possible conversion to wet. The results of the AREDS 2 study was discussed with the patient. A diet rich in dark leafy green vegetables was advised and specific recommendations were made regarding supplements with AREDS 2 formulation . Control of hypertension and serum cholesterol may slow the disease. Smoking cessation is mandatory to slow the disease and diminish the risk of progressing to wet age related macular degeneration. The patient was instructed in the use of an [place] and was told to return immediately for any changes in the Grid. Stressed to the patient do not rub eyes  OU with perimacular and subfoveal geographic atrophy may be limiting acuity 1-Day      ICD-10-CM   1. Exudative age-related macular degeneration of left eye with active choroidal neovascularization (HCC)  H35.3221 OCT, Retina - OU - Both Eyes    Intravitreal Injection, Pharmacologic Agent - OS - Left Eye    aflibercept (EYLEA) SOLN 2 mg    2. Exudative age-related macular degeneration of right eye with inactive choroidal neovascularization ([place])  H35.3212     3. Advanced nonexudative age-related macular degeneration of both eyes with subfoveal  involvement  H35.3134       1.  Repeat injection intravitreal Eylea OS now on examination again in 5 to 6 weeks.  2.  3.  Ophthalmic Meds Ordered this visit:  Meds ordered this encounter  Medications   aflibercept (EYLEA) SOLN 2 mg       Return in about 5 weeks (around 11/27/2020) for dilate, OS, EYLEA OCT.  There are no Patient Instructions on  file for this visit.   Explained the diagnoses, plan, and follow up with the patient and they expressed understanding.  Patient expressed understanding of the importance of proper follow up care.   Clent Demark Macall Mccroskey M.D. Diseases & Surgery of the Retina and Vitreous Retina & Diabetic [place] 10/23/20     Abbreviations: M myopia (nearsighted); A astigmatism; H hyperopia (farsighted); P presbyopia; Mrx spectacle prescription;  CTL contact lenses; OD right eye; OS left eye; OU both eyes  XT exotropia; ET esotropia; PEK punctate epithelial keratitis; PEE punctate epithelial erosions; DES dry eye syndrome; MGD meibomian gland dysfunction; ATs artificial tears; PFAT's preservative free artificial tears; [place] nuclear sclerotic cataract; PSC posterior subcapsular cataract; ERM epi-retinal membrane; PVD posterior vitreous detachment; RD retinal detachment; DM diabetes mellitus; DR diabetic retinopathy; NPDR non-proliferative diabetic retinopathy; PDR proliferative diabetic retinopathy; CSME clinically significant macular edema; DME diabetic macular edema; dbh dot blot hemorrhages; CWS cotton wool spot; POAG primary open angle glaucoma; C/D cup-to-disc ratio; HVF humphrey visual field; GVF goldmann visual field; OCT optical coherence tomography; IOP intraocular pressure; BRVO Branch retinal vein occlusion; CRVO central retinal vein occlusion; CRAO central retinal artery occlusion; BRAO branch retinal artery occlusion; RT retinal tear; SB scleral buckle; PPV pars plana vitrectomy; VH Vitreous hemorrhage; PRP panretinal laser photocoagulation; IVK intravitreal kenalog; VMT vitreomacular traction; MH Macular hole;  NVD neovascularization of the disc; NVE neovascularization elsewhere; AREDS age related eye disease study; ARMD age related macular degeneration; POAG primary open angle glaucoma; EBMD epithelial/anterior basement membrane dystrophy; ACIOL anterior chamber intraocular lens; IOL intraocular lens; PCIOL  posterior chamber intraocular lens; Phaco/IOL phacoemulsification with intraocular lens placement; [place] photorefractive keratectomy; LASIK laser assisted in situ keratomileusis; HTN hypertension; DM diabetes mellitus; COPD chronic obstructive pulmonary disease

## 2020-10-23 NOTE — Assessment & Plan Note (Signed)
Left eye reactivated CNVM with intraretinal fluid thickening and CME at slightly over 9-week interval due to COVID restrictions and exposure.  Repeat intravitreal Eylea OS today and examination next in 5 to 6 weeks left eye will likely injection OS

## 2020-10-23 NOTE — Assessment & Plan Note (Signed)
Large old disciform scar with chronic CME atrophy in the macula not treatable

## 2020-10-23 NOTE — Assessment & Plan Note (Signed)
The nature of dry age related macular degeneration was discussed with the patient as well as its possible conversion to wet. The results of the AREDS 2 study was discussed with the patient. A diet rich in dark leafy green vegetables was advised and specific recommendations were made regarding supplements with AREDS 2 formulation . Control of hypertension and serum cholesterol may slow the disease. Smoking cessation is mandatory to slow the disease and diminish the risk of progressing to wet age related macular degeneration. The patient was instructed in the use of an [place] and was told to return immediately for any changes in the Grid. Stressed to the patient do not rub eyes  OU with perimacular and subfoveal geographic atrophy may be limiting acuity 1-Day

## 2020-11-08 DIAGNOSIS — Z7901 Long term (current) use of anticoagulants: Secondary | ICD-10-CM | POA: Diagnosis not present

## 2020-11-08 DIAGNOSIS — I4891 Unspecified atrial fibrillation: Secondary | ICD-10-CM | POA: Diagnosis not present

## 2020-11-16 DIAGNOSIS — M859 Disorder of bone density and structure, unspecified: Secondary | ICD-10-CM | POA: Diagnosis not present

## 2020-11-16 DIAGNOSIS — E039 Hypothyroidism, unspecified: Secondary | ICD-10-CM | POA: Diagnosis not present

## 2020-11-16 DIAGNOSIS — E785 Hyperlipidemia, unspecified: Secondary | ICD-10-CM | POA: Diagnosis not present

## 2020-11-23 DIAGNOSIS — I482 Chronic atrial fibrillation, unspecified: Secondary | ICD-10-CM | POA: Diagnosis not present

## 2020-11-23 DIAGNOSIS — R82998 Other abnormal findings in urine: Secondary | ICD-10-CM | POA: Diagnosis not present

## 2020-11-23 DIAGNOSIS — G4733 Obstructive sleep apnea (adult) (pediatric): Secondary | ICD-10-CM | POA: Diagnosis not present

## 2020-11-23 DIAGNOSIS — K219 Gastro-esophageal reflux disease without esophagitis: Secondary | ICD-10-CM | POA: Diagnosis not present

## 2020-11-23 DIAGNOSIS — I700 Atherosclerosis of aorta: Secondary | ICD-10-CM | POA: Diagnosis not present

## 2020-11-23 DIAGNOSIS — Z0000 Encounter for general adult medical examination without abnormal findings: Secondary | ICD-10-CM | POA: Diagnosis not present

## 2020-11-23 DIAGNOSIS — Z7901 Long term (current) use of anticoagulants: Secondary | ICD-10-CM | POA: Diagnosis not present

## 2020-11-23 DIAGNOSIS — E785 Hyperlipidemia, unspecified: Secondary | ICD-10-CM | POA: Diagnosis not present

## 2020-11-23 DIAGNOSIS — M179 Osteoarthritis of knee, unspecified: Secondary | ICD-10-CM | POA: Diagnosis not present

## 2020-11-23 DIAGNOSIS — E039 Hypothyroidism, unspecified: Secondary | ICD-10-CM | POA: Diagnosis not present

## 2020-11-23 DIAGNOSIS — I10 Essential (primary) hypertension: Secondary | ICD-10-CM | POA: Diagnosis not present

## 2020-11-23 DIAGNOSIS — Z1331 Encounter for screening for depression: Secondary | ICD-10-CM | POA: Diagnosis not present

## 2020-11-23 DIAGNOSIS — N1831 Chronic kidney disease, stage 3a: Secondary | ICD-10-CM | POA: Diagnosis not present

## 2020-11-27 ENCOUNTER — Other Ambulatory Visit: Payer: Self-pay

## 2020-11-27 ENCOUNTER — Encounter (INDEPENDENT_AMBULATORY_CARE_PROVIDER_SITE_OTHER): Payer: Self-pay | Admitting: Ophthalmology

## 2020-11-27 ENCOUNTER — Ambulatory Visit (INDEPENDENT_AMBULATORY_CARE_PROVIDER_SITE_OTHER): Payer: Medicare Other | Admitting: Ophthalmology

## 2020-11-27 DIAGNOSIS — H353212 Exudative age-related macular degeneration, right eye, with inactive choroidal neovascularization: Secondary | ICD-10-CM

## 2020-11-27 DIAGNOSIS — Z9989 Dependence on other enabling machines and devices: Secondary | ICD-10-CM

## 2020-11-27 DIAGNOSIS — G4733 Obstructive sleep apnea (adult) (pediatric): Secondary | ICD-10-CM

## 2020-11-27 DIAGNOSIS — H353221 Exudative age-related macular degeneration, left eye, with active choroidal neovascularization: Secondary | ICD-10-CM

## 2020-11-27 MED ORDER — BEVACIZUMAB 2.5 MG/0.1ML IZ SOSY
2.5000 mg | PREFILLED_SYRINGE | INTRAVITREAL | Status: AC | PRN
  Administered 2020-11-27: 2.5 mg via INTRAVITREAL

## 2020-11-27 NOTE — Progress Notes (Signed)
11/27/2020     CHIEF COMPLAINT Patient presents for Retina Follow Up   HISTORY OF PRESENT ILLNESS: Sylvia [name] is a 87 y.o. female who presents to the clinic today for:   HPI     Retina Follow Up           Diagnosis: Wet AMD   Laterality: left eye   Onset: 5 weeks ago   Severity: mild   Duration: 5 weeks   Course: gradually worsening         Comments   5 week fu OS and Eylea OS Pt states, "For the last week my vision has been not good at all in my left eye."        Last edited by Kendra Opitz, COA on 11/27/2020  1:35 PM.      Referring physician: Leanna Battles, MD [place],  [place] [address]  HISTORICAL INFORMATION:   Selected notes from the [facility]: Current Outpatient Medications (Ophthalmic Drugs)  Medication Sig   hydroxypropyl methylcellulose / hypromellose (ISOPTO TEARS / GONIOVISC) 2.5 % ophthalmic solution Place 1 drop into both eyes as needed for dry eyes.   No current facility-administered medications for this visit. (Ophthalmic Drugs)   Current Outpatient Medications (Other)  Medication Sig   acetaminophen (TYLENOL) 325 MG tablet Take by mouth every 6 (six) hours as needed for mild pain.   amLODipine (NORVASC) 2.5 MG tablet TAKE 2 TABLETS BY MOUTH IN THE MORNING AND 1 IN THE EVENING   calcium citrate-vitamin D (CITRACAL+D) 315-200 MG-UNIT per tablet Take 1 tablet by mouth daily.   diclofenac sodium (VOLTAREN) 1 % GEL Apply 4 g topically 4 (four) times daily.   fluocinonide (LIDEX) 0.05 % external solution 2 (two) times daily. 1-2 GTTS IN EARS TWICE WEEKLY AS NEEDED    levothyroxine (SYNTHROID, LEVOTHROID) 75 MCG tablet Take 75 mcg by mouth daily.     losartan (COZAAR) 100 MG tablet Take 100 mg by mouth daily.     metoprolol tartrate (LOPRESSOR) 50 MG tablet Take 50 mg by mouth 2 (two) times daily.   Multiple Vitamins-Minerals (OCUVITE PRESERVISION PO) Take 1 tablet by mouth 2  (two) times daily.   pantoprazole (PROTONIX) 40 MG tablet Take 40 mg by mouth 2 (two) times daily.   polyethylene glycol powder (GLYCOLAX/MIRALAX) powder Take 17 g by mouth daily. PRN     predniSONE (DELTASONE) 20 MG tablet    promethazine (PHENERGAN) 25 MG tablet Take 25 mg by mouth 3 (three) times daily as needed.   raloxifene (EVISTA) 60 MG tablet Take 60 mg by mouth daily.     sucralfate (CARAFATE) 1 GM/10ML suspension Take 10 mLs (1 g total) by mouth 4 (four) times daily -  with meals and at bedtime.   warfarin (COUMADIN) 5 MG tablet TAKE 1/2 TO 1 TABLET BY MOUTH ONCE DAILY AS DIRECTED BY COUMADIN CLINIC   No current facility-administered medications for this visit. (Other)      REVIEW OF SYSTEMS:    ALLERGIES Allergies  Allergen Reactions   Metoclopramide Hcl     REACTION: nervous, climbs the wall   Penicillins     REACTION: hives   Pravastatin    Sulfonamide Derivatives     REACTION: rash    PAST MEDICAL HISTORY Past Medical History:  Diagnosis Date   Atrial fibrillation ([place])    ATRIAL FIBRILLATION S/P  PVI 6/11   CAD (coronary artery disease)  a. 03/2002: cath showing 20% OM1 stenosis, EF at 65%, no WMA. No significant CAD.    GERD (gastroesophageal reflux disease)    Hyperlipidemia    Hypertension    Macular degeneration    SVT (supraventricular tachycardia) (HCC)    Past Surgical History:  Procedure Laterality Date   ABLATION OF DYSRHYTHMIC FOCUS  09/2009   s/p PVI by JA   TOTAL ABDOMINAL HYSTERECTOMY      FAMILY HISTORY Family History  Problem Relation Age of Onset   Stroke Other    Coronary artery disease Other     SOCIAL HISTORY Social History   Tobacco Use   Smoking status: Never   Smokeless tobacco: Never  Vaping Use   Vaping Use: Never used  Substance Use Topics   Alcohol use: No   Drug use: No         OPHTHALMIC EXAM:  Base Eye Exam     Visual Acuity (ETDRS)       Right Left   Dist cc HM 20/70 -3   Dist ph cc  NI     Correction: Glasses         Tonometry (Tonopen, 1:38 PM)       Right Left   Pressure 17 17         Pupils       Pupils Dark Light Shape React APD   Right PERRL 2 2 Round Minimal None   Left PERRL 2 2 Round Minimal None         Visual Fields (Counting fingers)       Left Right    Full Full         Extraocular Movement       Right Left    Full Full         Neuro/Psych     Oriented x3: Yes   Mood/Affect: Normal         Dilation     Left eye: 1.0% Mydriacyl, 2.5% Phenylephrine @ 1:38 PM           Slit Lamp and Fundus Exam     External Exam       Right Left   External Normal Normal         Slit Lamp Exam       Right Left   Lids/Lashes Normal Normal   Conjunctiva/Sclera White and quiet White and quiet   Cornea Clear Clear   Anterior Chamber Deep and quiet Deep and quiet   Iris Round and reactive Round and reactive   Lens Posterior chamber intraocular lens Posterior chamber intraocular lens   Anterior Vitreous Normal Normal         Fundus Exam       Right Left   Posterior Vitreous  Posterior vitreous detachment   Disc  Normal   C/D Ratio  0.1   Macula  Age related macular degeneration, Atrophy into faz, advanced age related macular degeneration, Retinal pigment epithelial atrophy, Geographic atrophy in the FAZ,    Vessels  Normal   Periphery  Normal            IMAGING AND PROCEDURES  Imaging and Procedures for 11/27/20  OCT, Retina - OU - Both Eyes       Right Eye Quality was good. Scan locations included subfoveal. Central Foveal Thickness: 928. Progression has been stable. Findings include abnormal foveal contour, retinal drusen , cystoid macular edema, intraretinal fluid, disciform scar.   Left Eye Quality was good. Scan  locations included subfoveal. Central Foveal Thickness: 247. Progression has improved. Findings include abnormal foveal contour, intraretinal hyper-reflective material, no SRF, no IRF.    Notes At 5-week follow-up today, increased CME increased fluid and increased thickness left eye from reactivation of CNVM.  Will need repeat Eylea today and examination again in 5 to 6-week  OD with chronic subfoveal disciform scar, chronic CME intraretinal fluid, not treatable  Stable     Intravitreal Injection, Pharmacologic Agent - OS - Left Eye       Time Out 11/27/2020. 1:58 PM. Confirmed correct patient, procedure, site, and patient consented.   Anesthesia Topical anesthesia was used. Anesthetic medications included Akten 3.5%.   Procedure Preparation included Tobramycin 0.3%, Ofloxacin , 10% betadine to eyelids, 5% betadine to ocular surface. A 30 gauge needle was used.   Injection: 2.5 mg bevacizumab 2.5 MG/0.1ML   Route: Intravitreal, Site: Left Eye   NDC: [phone], Lot: [id number]   Post-op Post injection exam found visual acuity of at least counting fingers. The patient tolerated the procedure well. There were no complications. The patient received written and verbal post procedure care education. Post injection medications were not given.              ASSESSMENT/PLAN:  Exudative age-related macular degeneration of left eye with active choroidal neovascularization (HCC) The nature of wet macular degeneration was discussed with the patient.  Forms of therapy reviewed include the use of Anti-VEGF medications injected painlessly into the eye, as well as other possible treatment modalities, including thermal laser therapy. Fellow eye involvement and risks were discussed with the patient. Upon the finding of wet age related macular degeneration, treatment will be offered. The treatment regimen is on a treat as needed basis with the intent to treat if necessary and extend interval of exams when possible. On average 1 out of 6 patients do not need lifetime therapy. However, the risk of recurrent disease is high for a lifetime.  Initially monthly, then periodic,  examinations and evaluations will determine whether the next treatment is required on the day of the examination. OS vastly improved after injection restarted Eylea OS, at 5-week interval today repeat again today  Exudative age-related macular degeneration of right eye with inactive choroidal neovascularization (HCC) Acuity OD limited by severe chronic disciform scar  Obstructive sleep apnea treated with continuous positive airway pressure (CPAP) I recommend the patient to resume CPAP immediately to preserve visual functioning and decrease macular hypoxia from sleep apnea OS     ICD-10-CM   1. Exudative age-related macular degeneration of left eye with active choroidal neovascularization (HCC)  H35.3221 OCT, Retina - OU - Both Eyes    Intravitreal Injection, Pharmacologic Agent - OS - Left Eye    bevacizumab (AVASTIN) SOSY 2.5 mg    2. Exudative age-related macular degeneration of right eye with inactive choroidal neovascularization ([place])  H35.3212     3. Obstructive sleep apnea treated with continuous positive airway pressure (CPAP)  G47.33    Z99.89       OS improved post Eylea 5-week interval.  Improved acuity as well.  Repeat injection today and follow-up next in 5  2.  3.  Ophthalmic Meds Ordered this visit:  Meds ordered this encounter  Medications   bevacizumab (AVASTIN) SOSY 2.5 mg       Return in about 5 weeks (around 01/01/2021) for dilate, OS, EYLEA OCT.  There are no Patient Instructions on file for this visit.   Explained the diagnoses, plan, and follow  up with the patient and they expressed understanding.  Patient expressed understanding of the importance of proper follow up care.   Clent Demark Alla Sloma M.D. Diseases & Surgery of the Retina and Vitreous [facility] 11/27/20     Abbreviations: M myopia (nearsighted); A astigmatism; H hyperopia (farsighted); P presbyopia; Mrx spectacle prescription;  CTL contact lenses; OD right eye; OS left  eye; OU both eyes  XT exotropia; ET esotropia; PEK punctate epithelial keratitis; PEE punctate epithelial erosions; DES dry eye syndrome; MGD meibomian gland dysfunction; ATs artificial tears; PFAT's preservative free artificial tears; [place] nuclear sclerotic cataract; PSC posterior subcapsular cataract; ERM epi-retinal membrane; PVD posterior vitreous detachment; RD retinal detachment; DM diabetes mellitus; DR diabetic retinopathy; NPDR non-proliferative diabetic retinopathy; PDR proliferative diabetic retinopathy; CSME clinically significant macular edema; DME diabetic macular edema; dbh dot blot hemorrhages; CWS cotton wool spot; POAG primary open angle glaucoma; C/D cup-to-disc ratio; HVF humphrey visual field; GVF goldmann visual field; OCT optical coherence tomography; IOP intraocular pressure; BRVO Branch retinal vein occlusion; CRVO central retinal vein occlusion; CRAO central retinal artery occlusion; BRAO branch retinal artery occlusion; RT retinal tear; SB scleral buckle; PPV pars plana vitrectomy; VH Vitreous hemorrhage; PRP panretinal laser photocoagulation; IVK intravitreal kenalog; VMT vitreomacular traction; MH Macular hole;  NVD neovascularization of the disc; NVE neovascularization elsewhere; AREDS age related eye disease study; ARMD age related macular degeneration; POAG primary open angle glaucoma; EBMD epithelial/anterior basement membrane dystrophy; ACIOL anterior chamber intraocular lens; IOL intraocular lens; PCIOL posterior chamber intraocular lens; Phaco/IOL phacoemulsification with intraocular lens placement; [place] photorefractive keratectomy; LASIK laser assisted in situ keratomileusis; HTN hypertension; DM diabetes mellitus; COPD chronic obstructive pulmonary disease

## 2020-11-27 NOTE — Assessment & Plan Note (Signed)
The nature of wet macular degeneration was discussed with the patient.  Forms of therapy reviewed include the use of Anti-VEGF medications injected painlessly into the eye, as well as other possible treatment modalities, including thermal laser therapy. Fellow eye involvement and risks were discussed with the patient. Upon the finding of wet age related macular degeneration, treatment will be offered. The treatment regimen is on a treat as needed basis with the intent to treat if necessary and extend interval of exams when possible. On average 1 out of 6 patients do not need lifetime therapy. However, the risk of recurrent disease is high for a lifetime.  Initially monthly, then periodic, examinations and evaluations will determine whether the next treatment is required on the day of the examination. OS vastly improved after injection restarted Eylea OS, at 5-week interval today repeat again today

## 2020-11-27 NOTE — Assessment & Plan Note (Signed)
I recommend the patient to resume CPAP immediately to preserve visual functioning and decrease macular hypoxia from sleep apnea OS

## 2020-11-27 NOTE — Assessment & Plan Note (Signed)
Acuity OD limited by severe chronic disciform scar

## 2020-11-28 ENCOUNTER — Encounter: Payer: Self-pay | Admitting: Orthopaedic Surgery

## 2020-11-28 ENCOUNTER — Ambulatory Visit (INDEPENDENT_AMBULATORY_CARE_PROVIDER_SITE_OTHER): Payer: Medicare Other | Admitting: Orthopaedic Surgery

## 2020-11-28 DIAGNOSIS — M1712 Unilateral primary osteoarthritis, left knee: Secondary | ICD-10-CM

## 2020-11-28 DIAGNOSIS — M170 Bilateral primary osteoarthritis of knee: Secondary | ICD-10-CM

## 2020-11-28 MED ORDER — METHYLPREDNISOLONE ACETATE 40 MG/ML IJ SUSP
80.0000 mg | INTRAMUSCULAR | Status: AC | PRN
  Administered 2020-11-28: 80 mg via INTRA_ARTICULAR

## 2020-11-28 MED ORDER — LIDOCAINE HCL 1 % IJ SOLN
2.0000 mL | INTRAMUSCULAR | Status: AC | PRN
  Administered 2020-11-28: 2 mL

## 2020-11-28 MED ORDER — BUPIVACAINE HCL 0.25 % IJ SOLN
2.0000 mL | INTRAMUSCULAR | Status: AC | PRN
  Administered 2020-11-28: 2 mL via INTRA_ARTICULAR

## 2020-11-28 NOTE — Progress Notes (Signed)
Office Visit Note   Patient: Sylvia [name]           Date of Birth: February 10, 1933           MRN: [id number] Visit Date: 11/28/2020              Requested by: Leanna Battles, MD [place],  [place] [address] PCP: Leanna Battles, MD   Assessment & Plan: Visit Diagnoses:  1. Bilateral primary osteoarthritis of knee     Plan: Sylvia [name] is experiencing recurrent symptoms of osteoarthritis left knee.  I saw her in April of this year and injected her right knee.  She has had prior films demonstrating arthritis in both knees.  Today we will plan to inject the lateral compartment of her left knee as she is more symptomatic on the left  Follow-Up Instructions: Return if symptoms worsen or fail to improve.   Orders:  No orders of the defined types were placed in this encounter.  No orders of the defined types were placed in this encounter.     Procedures: Large Joint Inj: L knee on 11/28/2020 11:22 AM Indications: pain and diagnostic evaluation Details: 25 G 1.5 in needle, anterolateral approach  Arthrogram: No  Medications: 2 mL lidocaine 1 %; 80 mg methylPREDNISolone acetate 40 MG/ML; 2 mL bupivacaine 0.25 % Procedure, treatment alternatives, risks and benefits explained, specific risks discussed. Consent was given by the patient. Patient was prepped and draped in the usual sterile fashion.      Clinical Data: No additional findings.   Subjective: Chief Complaint  Patient presents with   Left Knee - Pain  Insidious onset of recurrent left knee pain.  Has had a diagnosis of osteoarthritis in the past predominantly in the lateral compartment with good response to cortisone.  Seen in April to inject the right knee with good response  HPI  Review of Systems   Objective: Vital Signs: There were no vitals taken for this visit.  Physical Exam Constitutional:      Appearance: She is well-developed.  Pulmonary:     Effort: Pulmonary effort is normal.   Skin:    General: Skin is warm and dry.  Neurological:     Mental Status: She is alert and oriented to person, place, and time.  Psychiatric:        Behavior: Behavior normal.    Ortho Exam left knee was not hot red warm or swollen.  Mostly lateral joint pain and some increased valgus with weightbearing.  Full extension and flexed over 100 degrees without instability.  Little bit of patella crepitation with flexion extension but no pain with compression.  No pain laterally  Specialty Comments:  No specialty comments available.  Imaging: Intravitreal Injection, Pharmacologic Agent - OS - Left Eye  Result Date: 11/27/2020 Time Out 11/27/2020. 1:58 PM. Confirmed correct patient, procedure, site, and patient consented. Anesthesia Topical anesthesia was used. Anesthetic medications included Akten 3.5%. Procedure Preparation included Tobramycin 0.3%, Ofloxacin , 10% betadine to eyelids, 5% betadine to ocular surface. A 30 gauge needle was used. Injection: 2.5 mg bevacizumab 2.5 MG/0.1ML   Route: Intravitreal, Site: Left Eye   NDC: [phone], Lot: [id number] Post-op Post injection exam found visual acuity of at least counting fingers. The patient tolerated the procedure well. There were no complications. The patient received written and verbal post procedure care education. Post injection medications were not given.   OCT, Retina - OU - Both Eyes  Result Date: 11/27/2020 Right Eye Quality  was good. Scan locations included subfoveal. Central Foveal Thickness: 928. Progression has been stable. Findings include abnormal foveal contour, retinal drusen , cystoid macular edema, intraretinal fluid, disciform scar. Left Eye Quality was good. Scan locations included subfoveal. Central Foveal Thickness: 247. Progression has improved. Findings include abnormal foveal contour, intraretinal hyper-reflective material, no SRF, no IRF. Notes At 5-week follow-up today, increased CME increased fluid and increased  thickness left eye from reactivation of CNVM.  Will need repeat Eylea today and examination again in 5 to 6-week OD with chronic subfoveal disciform scar, chronic CME intraretinal fluid, not treatable Stable    PMFS History: Patient Active Problem List   Diagnosis Date Noted   Advanced nonexudative age-related macular degeneration of both eyes with subfoveal involvement 12/06/2019   Exudative age-related macular degeneration of left eye with active choroidal neovascularization ([place]) 07/21/2019   Exudative age-related macular degeneration of right eye with inactive choroidal neovascularization ([place]) 07/21/2019   Cystoid macular edema of right eye 07/21/2019   Posterior vitreous detachment of left eye 07/21/2019   Cervical spondylosis without myelopathy 01/18/2019   Spondylolisthesis of cervical region 01/18/2019   Obstructive sleep apnea treated with continuous positive airway pressure (CPAP) 06/02/2018   Unilateral primary osteoarthritis, right knee 03/02/2017   Unilateral primary osteoarthritis, right knee 03/02/2017   Long term (current) use of anticoagulants 07/26/2010   FATIGUE 05/23/2010   HYPERCHOLESTEROLEMIA, PURE 02/13/2008   HYPERTENSION, BENIGN 02/13/2008   SVT/ PSVT/ PAT 02/13/2008   ATRIAL FIBRILLATION 02/13/2008   GERD 02/13/2008   PALPITATIONS 02/13/2008   Past Medical History:  Diagnosis Date   Atrial fibrillation ([place])    ATRIAL FIBRILLATION S/P  PVI 6/11   CAD (coronary artery disease)    a. 03/2002: cath showing 20% OM1 stenosis, EF at 65%, no WMA. No significant CAD.    GERD (gastroesophageal reflux disease)    Hyperlipidemia    Hypertension    Macular degeneration    SVT (supraventricular tachycardia) (HCC)     Family History  Problem Relation Age of Onset   Stroke Other    Coronary artery disease Other     Past Surgical History:  Procedure Laterality Date   ABLATION OF DYSRHYTHMIC FOCUS  09/2009   s/p PVI by JA   TOTAL ABDOMINAL HYSTERECTOMY      Social History   Occupational History   Not on file  Tobacco Use   Smoking status: Never   Smokeless tobacco: Never  Vaping Use   Vaping Use: Never used  Substance and Sexual Activity   Alcohol use: No   Drug use: No   Sexual activity: Not on file     Garald Balding, MD   Note - This record has been created using Editor, commissioning.  Chart creation errors have been sought, but may not always  have been located. Such creation errors do not reflect on  the standard of medical care.

## 2020-12-06 DIAGNOSIS — I4891 Unspecified atrial fibrillation: Secondary | ICD-10-CM | POA: Diagnosis not present

## 2020-12-06 DIAGNOSIS — Z7901 Long term (current) use of anticoagulants: Secondary | ICD-10-CM | POA: Diagnosis not present

## 2020-12-18 ENCOUNTER — Ambulatory Visit: Payer: Medicare Other | Admitting: Orthopaedic Surgery

## 2020-12-20 ENCOUNTER — Ambulatory Visit (INDEPENDENT_AMBULATORY_CARE_PROVIDER_SITE_OTHER): Payer: Medicare Other | Admitting: Orthopaedic Surgery

## 2020-12-20 ENCOUNTER — Other Ambulatory Visit: Payer: Self-pay

## 2020-12-20 ENCOUNTER — Encounter: Payer: Self-pay | Admitting: Orthopaedic Surgery

## 2020-12-20 VITALS — Ht 61.0 in | Wt 150.0 lb

## 2020-12-20 DIAGNOSIS — M1711 Unilateral primary osteoarthritis, right knee: Secondary | ICD-10-CM

## 2020-12-20 DIAGNOSIS — M25562 Pain in left knee: Secondary | ICD-10-CM

## 2020-12-20 MED ORDER — BUPIVACAINE HCL 0.25 % IJ SOLN
2.0000 mL | INTRAMUSCULAR | Status: AC | PRN
  Administered 2020-12-20: 2 mL via INTRA_ARTICULAR

## 2020-12-20 MED ORDER — LIDOCAINE HCL 1 % IJ SOLN
2.0000 mL | INTRAMUSCULAR | Status: AC | PRN
  Administered 2020-12-20: 2 mL

## 2020-12-20 MED ORDER — METHYLPREDNISOLONE ACETATE 40 MG/ML IJ SUSP
80.0000 mg | INTRAMUSCULAR | Status: AC | PRN
  Administered 2020-12-20: 80 mg via INTRA_ARTICULAR

## 2020-12-20 NOTE — Progress Notes (Signed)
Office Visit Note   Patient: Sylvia [name]           Date of Birth: May 16, 1932           MRN: [id number] Visit Date: 12/20/2020              Requested by: Leanna Battles, MD [place],  [place] [address] PCP: Leanna Battles, MD   Assessment & Plan: Visit Diagnoses:  1. Unilateral primary osteoarthritis, right knee     Plan: Mrs. Hanners is experiencing symptoms of arthritis of her right knee.  She is recently had a successful cortisone injection in the left knee and wants the same on the right.  We will perform a Depo-Medrol injection in the medial compartment right knee  Follow-Up Instructions: Return if symptoms worsen or fail to improve.   Orders:  No orders of the defined types were placed in this encounter.  No orders of the defined types were placed in this encounter.     Procedures: Large Joint Inj: R knee on 12/20/2020 5:44 PM Indications: pain and diagnostic evaluation Details: 25 G 1.5 in needle, anteromedial approach  Arthrogram: No  Medications: 2 mL lidocaine 1 %; 80 mg methylPREDNISolone acetate 40 MG/ML; 2 mL bupivacaine 0.25 % Procedure, treatment alternatives, risks and benefits explained, specific risks discussed. Consent was given by the patient. Immediately prior to procedure a time out was called to verify the correct patient, procedure, equipment, support staff and site/side marked as required. Patient was prepped and draped in the usual sterile fashion.      Clinical Data: No additional findings.   Subjective: Chief Complaint  Patient presents with   Left Knee - Follow-up   Right Knee - Follow-up  Patient presents today for a two week follow up on her knees. She received a left knee cortisone injection on 11/28/2020. She states that her left knee doesn't hurt at all anymore. She is wanting to get her right knee injected today.   HPI  Review of Systems   Objective: Vital Signs: Ht '5\' 1"'$  (1.549 m)   Wt 150 lb (68 kg)    BMI 28.34 kg/m   Physical Exam Constitutional:      Appearance: She is well-developed.  Pulmonary:     Effort: Pulmonary effort is normal.  Skin:    General: Skin is warm and dry.  Neurological:     Mental Status: She is alert and oriented to person, place, and time.  Psychiatric:        Behavior: Behavior normal.    Ortho Exam awake alert and oriented x3.  Comfortable sitting.  Right knee was not hot warm or red.  No instability.  Had some medial and lateral joint pain.  No instability.  Full extension of flexed about 100 degrees  Specialty Comments:  No specialty comments available.  Imaging: No results found.   PMFS History: Patient Active Problem List   Diagnosis Date Noted   Advanced nonexudative age-related macular degeneration of both eyes with subfoveal involvement 12/06/2019   Exudative age-related macular degeneration of left eye with active choroidal neovascularization ([place]) 07/21/2019   Exudative age-related macular degeneration of right eye with inactive choroidal neovascularization ([place]) 07/21/2019   Cystoid macular edema of right eye 07/21/2019   Posterior vitreous detachment of left eye 07/21/2019   Cervical spondylosis without myelopathy 01/18/2019   Spondylolisthesis of cervical region 01/18/2019   Obstructive sleep apnea treated with continuous positive airway pressure (CPAP) 06/02/2018   Unilateral primary osteoarthritis,  right knee 03/02/2017   Unilateral primary osteoarthritis, right knee 03/02/2017   Long term (current) use of anticoagulants 07/26/2010   FATIGUE 05/23/2010   HYPERCHOLESTEROLEMIA, PURE 02/13/2008   HYPERTENSION, BENIGN 02/13/2008   SVT/ PSVT/ PAT 02/13/2008   ATRIAL FIBRILLATION 02/13/2008   GERD 02/13/2008   PALPITATIONS 02/13/2008   Past Medical History:  Diagnosis Date   Atrial fibrillation ([place])    ATRIAL FIBRILLATION S/P  PVI 6/11   CAD (coronary artery disease)    a. 03/2002: cath showing 20% OM1 stenosis, EF at 65%,  no WMA. No significant CAD.    GERD (gastroesophageal reflux disease)    Hyperlipidemia    Hypertension    Macular degeneration    SVT (supraventricular tachycardia) (HCC)     Family History  Problem Relation Age of Onset   Stroke Other    Coronary artery disease Other     Past Surgical History:  Procedure Laterality Date   ABLATION OF DYSRHYTHMIC FOCUS  09/2009   s/p PVI by JA   TOTAL ABDOMINAL HYSTERECTOMY     Social History   Occupational History   Not on file  Tobacco Use   Smoking status: Never   Smokeless tobacco: Never  Vaping Use   Vaping Use: Never used  Substance and Sexual Activity   Alcohol use: No   Drug use: No   Sexual activity: Not on file

## 2020-12-26 DIAGNOSIS — Z7901 Long term (current) use of anticoagulants: Secondary | ICD-10-CM | POA: Diagnosis not present

## 2020-12-26 DIAGNOSIS — R110 Nausea: Secondary | ICD-10-CM | POA: Diagnosis not present

## 2020-12-26 DIAGNOSIS — I129 Hypertensive chronic kidney disease with stage 1 through stage 4 chronic kidney disease, or unspecified chronic kidney disease: Secondary | ICD-10-CM | POA: Diagnosis not present

## 2020-12-26 DIAGNOSIS — M545 Low back pain, unspecified: Secondary | ICD-10-CM | POA: Diagnosis not present

## 2020-12-26 DIAGNOSIS — N1831 Chronic kidney disease, stage 3a: Secondary | ICD-10-CM | POA: Diagnosis not present

## 2020-12-26 DIAGNOSIS — I4891 Unspecified atrial fibrillation: Secondary | ICD-10-CM | POA: Diagnosis not present

## 2021-01-01 ENCOUNTER — Encounter (INDEPENDENT_AMBULATORY_CARE_PROVIDER_SITE_OTHER): Payer: Self-pay | Admitting: Ophthalmology

## 2021-01-01 ENCOUNTER — Other Ambulatory Visit: Payer: Self-pay

## 2021-01-01 ENCOUNTER — Ambulatory Visit (INDEPENDENT_AMBULATORY_CARE_PROVIDER_SITE_OTHER): Payer: Medicare Other | Admitting: Ophthalmology

## 2021-01-01 DIAGNOSIS — H353134 Nonexudative age-related macular degeneration, bilateral, advanced atrophic with subfoveal involvement: Secondary | ICD-10-CM | POA: Diagnosis not present

## 2021-01-01 DIAGNOSIS — H353221 Exudative age-related macular degeneration, left eye, with active choroidal neovascularization: Secondary | ICD-10-CM | POA: Diagnosis not present

## 2021-01-01 DIAGNOSIS — H353212 Exudative age-related macular degeneration, right eye, with inactive choroidal neovascularization: Secondary | ICD-10-CM

## 2021-01-01 MED ORDER — AFLIBERCEPT 2MG/0.05ML IZ SOLN FOR KALEIDOSCOPE
2.0000 mg | INTRAVITREAL | Status: AC | PRN
  Administered 2021-01-01: 2 mg via INTRAVITREAL

## 2021-01-01 NOTE — Assessment & Plan Note (Signed)
Chronic subfoveal disciform with massive CME overlying no change over time not treatable

## 2021-01-01 NOTE — Assessment & Plan Note (Signed)
Accounting for vision

## 2021-01-01 NOTE — Progress Notes (Signed)
01/01/2021     CHIEF COMPLAINT Patient presents for  Chief Complaint  Patient presents with   Retina Follow Up      HISTORY OF PRESENT ILLNESS: Sylvia [name] is a 87 y.o. female who presents to the clinic today for:   HPI     Retina Follow Up   Patient presents with  Wet AMD.  In left eye.  This started 5 weeks ago.  Severity is mild.  Duration of 5 weeks.  Since onset it is stable.        Comments   5 week fu os and Eylea OS and OCT Pt states, "My vision is still very blurred but I cannot tell if it is any worse or not.'       Last edited by Kendra Opitz, COA on 01/01/2021  1:58 PM.      Referring physician: Leanna Battles, MD [place],  [place] [address]  HISTORICAL INFORMATION:   Selected notes from the [facility]: Current Outpatient Medications (Ophthalmic Drugs)  Medication Sig   hydroxypropyl methylcellulose / hypromellose (ISOPTO TEARS / GONIOVISC) 2.5 % ophthalmic solution Place 1 drop into both eyes as needed for dry eyes.   No current facility-administered medications for this visit. (Ophthalmic Drugs)   Current Outpatient Medications (Other)  Medication Sig   acetaminophen (TYLENOL) 325 MG tablet Take by mouth every 6 (six) hours as needed for mild pain.   amLODipine (NORVASC) 2.5 MG tablet TAKE 2 TABLETS BY MOUTH IN THE MORNING AND 1 IN THE EVENING   calcium citrate-vitamin D (CITRACAL+D) 315-200 MG-UNIT per tablet Take 1 tablet by mouth daily.   diclofenac sodium (VOLTAREN) 1 % GEL Apply 4 g topically 4 (four) times daily.   fluocinonide (LIDEX) 0.05 % external solution 2 (two) times daily. 1-2 GTTS IN EARS TWICE WEEKLY AS NEEDED    levothyroxine (SYNTHROID, LEVOTHROID) 75 MCG tablet Take 75 mcg by mouth daily.     losartan (COZAAR) 100 MG tablet Take 100 mg by mouth daily.     metoprolol tartrate (LOPRESSOR) 50 MG tablet Take 50 mg by mouth 2 (two) times daily.   Multiple  Vitamins-Minerals (OCUVITE PRESERVISION PO) Take 1 tablet by mouth 2 (two) times daily.   pantoprazole (PROTONIX) 40 MG tablet Take 40 mg by mouth 2 (two) times daily.   polyethylene glycol powder (GLYCOLAX/MIRALAX) powder Take 17 g by mouth daily. PRN     predniSONE (DELTASONE) 20 MG tablet    promethazine (PHENERGAN) 25 MG tablet Take 25 mg by mouth 3 (three) times daily as needed.   raloxifene (EVISTA) 60 MG tablet Take 60 mg by mouth daily.     sucralfate (CARAFATE) 1 GM/10ML suspension Take 10 mLs (1 g total) by mouth 4 (four) times daily -  with meals and at bedtime.   warfarin (COUMADIN) 5 MG tablet TAKE 1/2 TO 1 TABLET BY MOUTH ONCE DAILY AS DIRECTED BY COUMADIN CLINIC   No current facility-administered medications for this visit. (Other)      REVIEW OF SYSTEMS:    ALLERGIES Allergies  Allergen Reactions   Metoclopramide Hcl     REACTION: nervous, climbs the wall   Penicillins     REACTION: hives   Pravastatin    Sulfonamide Derivatives     REACTION: rash    PAST MEDICAL HISTORY Past Medical History:  Diagnosis Date   Atrial fibrillation (HCC)    ATRIAL FIBRILLATION S/P  PVI 6/11   CAD (coronary artery disease)    a. 03/2002: cath showing 20% OM1 stenosis, EF at 65%, no WMA. No significant CAD.    GERD (gastroesophageal reflux disease)    Hyperlipidemia    Hypertension    Macular degeneration    SVT (supraventricular tachycardia) (HCC)    Past Surgical History:  Procedure Laterality Date   ABLATION OF DYSRHYTHMIC FOCUS  09/2009   s/p PVI by JA   TOTAL ABDOMINAL HYSTERECTOMY      FAMILY HISTORY Family History  Problem Relation Age of Onset   Stroke Other    Coronary artery disease Other     SOCIAL HISTORY Social History   Tobacco Use   Smoking status: Never   Smokeless tobacco: Never  Vaping Use   Vaping Use: Never used  Substance Use Topics   Alcohol use: No   Drug use: No         OPHTHALMIC EXAM:  Base Eye Exam     Visual Acuity  (ETDRS)       Right Left   Dist [place] HM 20/50 +2   Dist ph [place]  NI         Tonometry (Tonopen, 2:00 PM)       Right Left   Pressure 12 11         Pupils       Pupils Dark Light Shape React APD   Right PERRL 2 2 Round Minimal None   Left PERRL 2 2 Round Minimal None         Visual Fields (Counting fingers)       Left Right    Full Full         Extraocular Movement       Right Left    Full Full         Neuro/Psych     Oriented x3: Yes   Mood/Affect: Normal         Dilation     Left eye: 1.0% Mydriacyl, 2.5% Phenylephrine @ 2:00 PM           Slit Lamp and Fundus Exam     External Exam       Right Left   External Normal Normal         Slit Lamp Exam       Right Left   Lids/Lashes Normal Normal   Conjunctiva/Sclera White and quiet White and quiet   Cornea Clear Clear   Anterior Chamber Deep and quiet Deep and quiet   Iris Round and reactive Round and reactive   Lens Posterior chamber intraocular lens Posterior chamber intraocular lens   Anterior Vitreous Normal Normal         Fundus Exam       Right Left   Posterior Vitreous  Posterior vitreous detachment   Disc  Normal   C/D Ratio  0.1   Macula  Age related macular degeneration, Atrophy into faz, advanced age related macular degeneration, Retinal pigment epithelial atrophy, Geographic atrophy in the FAZ,    Vessels  Normal   Periphery  Normal            IMAGING AND PROCEDURES  Imaging and Procedures for 01/01/21  OCT, Retina - OU - Both Eyes       Right Eye Quality was good. Scan locations included subfoveal. Central Foveal Thickness: 706. Progression has been stable. Findings include abnormal foveal contour, retinal drusen , cystoid macular edema, intraretinal fluid, disciform scar.  Left Eye Quality was good. Scan locations included subfoveal. Central Foveal Thickness: 250. Progression has improved. Findings include abnormal foveal contour, intraretinal  hyper-reflective material, no SRF, no IRF.   Notes At 5-week follow-up today, increased CME increased fluid and increased thickness left eye from reactivation of CNVM.  Will need repeat Eylea today and examination again in 5 to 6-week  OD with chronic subfoveal disciform scar, chronic CME intraretinal fluid, not treatable  Stable     Intravitreal Injection, Pharmacologic Agent - OS - Left Eye       Time Out 01/01/2021. 2:24 PM. Confirmed correct patient, procedure, site, and patient consented.   Anesthesia Topical anesthesia was used. Anesthetic medications included [place] 3.5%.   Procedure Preparation included Tobramycin 0.3%, Ofloxacin , 10% betadine to eyelids, 5% betadine to ocular surface. A 30 gauge needle was used.   Injection: 2 mg aflibercept 2 MG/0.05ML   Route: Intravitreal, Site: Left Eye   NDC: A3590391, Lot: [id number], Waste: 0 mL   Post-op Post injection exam found visual acuity of at least counting fingers. The patient tolerated the procedure well. There were no complications. The patient received written and verbal post procedure care education. Post injection medications were not given.              ASSESSMENT/PLAN:  Exudative age-related macular degeneration of right eye with inactive choroidal neovascularization (HCC) Chronic subfoveal disciform with massive CME overlying no change over time not treatable  Exudative age-related macular degeneration of left eye with active choroidal neovascularization (HCC) Improved acuity OS at 5 weeks post Eylea OS.  Repeat injection today follow-up next in 6 weeks  Advanced nonexudative age-related macular degeneration of both eyes with subfoveal involvement Accounting for vision     ICD-10-CM   1. Exudative age-related macular degeneration of left eye with active choroidal neovascularization (HCC)  H35.3221 OCT, Retina - OU - Both Eyes    Intravitreal Injection, Pharmacologic Agent - OS - Left Eye     aflibercept (EYLEA) SOLN 2 mg    2. Exudative age-related macular degeneration of right eye with inactive choroidal neovascularization ([place])  H35.3212     3. Advanced nonexudative age-related macular degeneration of both eyes with subfoveal involvement  H35.3134       1.  OS vastly improved acuity and improved on Eylea intravitreal.  Repeat injection today and follow-up next in 6-week  2.  OS, geographic atrophy is encroaching upon the fovea yet stable at this time  3.  Ophthalmic Meds Ordered this visit:  Meds ordered this encounter  Medications   aflibercept (EYLEA) SOLN 2 mg       Return in about 6 weeks (around 02/12/2021) for DILATE OU, EYLEA OCT, OS.  There are no Patient Instructions on file for this visit.   Explained the diagnoses, plan, and follow up with the patient and they expressed understanding.  Patient expressed understanding of the importance of proper follow up care.   Clent Demark Veronia Laprise M.D. Diseases & Surgery of the Retina and Vitreous Retina & Diabetic [place] 01/01/21     Abbreviations: M myopia (nearsighted); A astigmatism; H hyperopia (farsighted); P presbyopia; Mrx spectacle prescription;  CTL contact lenses; OD right eye; OS left eye; OU both eyes  XT exotropia; ET esotropia; PEK punctate epithelial keratitis; PEE punctate epithelial erosions; DES dry eye syndrome; MGD meibomian gland dysfunction; ATs artificial tears; PFAT's preservative free artificial tears; [place] nuclear sclerotic cataract; PSC posterior subcapsular cataract; ERM epi-retinal membrane; PVD posterior vitreous detachment; RD retinal  detachment; DM diabetes mellitus; DR diabetic retinopathy; NPDR non-proliferative diabetic retinopathy; PDR proliferative diabetic retinopathy; CSME clinically significant macular edema; DME diabetic macular edema; dbh dot blot hemorrhages; CWS cotton wool spot; POAG primary open angle glaucoma; C/D cup-to-disc ratio; HVF humphrey visual field; GVF goldmann  visual field; OCT optical coherence tomography; IOP intraocular pressure; BRVO Branch retinal vein occlusion; CRVO central retinal vein occlusion; CRAO central retinal artery occlusion; BRAO branch retinal artery occlusion; RT retinal tear; SB scleral buckle; PPV pars plana vitrectomy; VH Vitreous hemorrhage; PRP panretinal laser photocoagulation; IVK intravitreal kenalog; VMT vitreomacular traction; MH Macular hole;  NVD neovascularization of the disc; NVE neovascularization elsewhere; AREDS age related eye disease study; ARMD age related macular degeneration; POAG primary open angle glaucoma; EBMD epithelial/anterior basement membrane dystrophy; ACIOL anterior chamber intraocular lens; IOL intraocular lens; PCIOL posterior chamber intraocular lens; Phaco/IOL phacoemulsification with intraocular lens placement; [place] photorefractive keratectomy; LASIK laser assisted in situ keratomileusis; HTN hypertension; DM diabetes mellitus; COPD chronic obstructive pulmonary disease

## 2021-01-01 NOTE — Assessment & Plan Note (Signed)
Improved acuity OS at 5 weeks post Eylea OS.  Repeat injection today follow-up next in 6 weeks

## 2021-01-03 DIAGNOSIS — M545 Low back pain, unspecified: Secondary | ICD-10-CM | POA: Diagnosis not present

## 2021-01-03 DIAGNOSIS — Z7901 Long term (current) use of anticoagulants: Secondary | ICD-10-CM | POA: Diagnosis not present

## 2021-01-03 DIAGNOSIS — I4891 Unspecified atrial fibrillation: Secondary | ICD-10-CM | POA: Diagnosis not present

## 2021-01-31 DIAGNOSIS — Z7901 Long term (current) use of anticoagulants: Secondary | ICD-10-CM | POA: Diagnosis not present

## 2021-01-31 DIAGNOSIS — I4891 Unspecified atrial fibrillation: Secondary | ICD-10-CM | POA: Diagnosis not present

## 2021-02-12 ENCOUNTER — Other Ambulatory Visit: Payer: Self-pay

## 2021-02-12 ENCOUNTER — Ambulatory Visit (INDEPENDENT_AMBULATORY_CARE_PROVIDER_SITE_OTHER): Payer: Medicare Other | Admitting: Ophthalmology

## 2021-02-12 ENCOUNTER — Encounter (INDEPENDENT_AMBULATORY_CARE_PROVIDER_SITE_OTHER): Payer: Self-pay | Admitting: Ophthalmology

## 2021-02-12 DIAGNOSIS — H353211 Exudative age-related macular degeneration, right eye, with active choroidal neovascularization: Secondary | ICD-10-CM | POA: Diagnosis not present

## 2021-02-12 DIAGNOSIS — H353221 Exudative age-related macular degeneration, left eye, with active choroidal neovascularization: Secondary | ICD-10-CM | POA: Diagnosis not present

## 2021-02-12 MED ORDER — AFLIBERCEPT 2MG/0.05ML IZ SOLN FOR KALEIDOSCOPE
2.0000 mg | INTRAVITREAL | Status: AC | PRN
  Administered 2021-02-12: 2 mg via INTRAVITREAL

## 2021-02-12 MED ORDER — BEVACIZUMAB 2.5 MG/0.1ML IZ SOSY
2.5000 mg | PREFILLED_SYRINGE | INTRAVITREAL | Status: AC | PRN
  Administered 2021-02-12: 2.5 mg via INTRAVITREAL

## 2021-02-12 NOTE — Progress Notes (Signed)
02/12/2021     CHIEF COMPLAINT Patient presents for  Chief Complaint  Patient presents with   Retina Follow Up      HISTORY OF PRESENT ILLNESS: Sylvia [name] is a 87 y.o. female who presents to the clinic today for:   HPI     Retina Follow Up   Patient presents with  Wet AMD.  In both eyes.  This started 6 weeks ago.  Duration of 6 weeks.  Since onset it is stable.        Comments   Pt states vision fluctuates from day to day.      Last edited by Reather Littler, COA on 02/12/2021  2:14 PM.      Referring physician: Leanna Battles, MD [place],  [place] [address]  HISTORICAL INFORMATION:   Selected notes from the MEDICAL RECORD NUMBER       CURRENT MEDICATIONS: Current Outpatient Medications (Ophthalmic Drugs)  Medication Sig   hydroxypropyl methylcellulose / hypromellose (ISOPTO TEARS / GONIOVISC) 2.5 % ophthalmic solution Place 1 drop into both eyes as needed for dry eyes.   No current facility-administered medications for this visit. (Ophthalmic Drugs)   Current Outpatient Medications (Other)  Medication Sig   acetaminophen (TYLENOL) 325 MG tablet Take by mouth every 6 (six) hours as needed for mild pain.   amLODipine (NORVASC) 2.5 MG tablet TAKE 2 TABLETS BY MOUTH IN THE MORNING AND 1 IN THE EVENING   calcium citrate-vitamin D (CITRACAL+D) 315-200 MG-UNIT per tablet Take 1 tablet by mouth daily.   diclofenac sodium (VOLTAREN) 1 % GEL Apply 4 g topically 4 (four) times daily.   fluocinonide (LIDEX) 0.05 % external solution 2 (two) times daily. 1-2 GTTS IN EARS TWICE WEEKLY AS NEEDED    levothyroxine (SYNTHROID, LEVOTHROID) 75 MCG tablet Take 75 mcg by mouth daily.     losartan (COZAAR) 100 MG tablet Take 100 mg by mouth daily.     metoprolol tartrate (LOPRESSOR) 50 MG tablet Take 50 mg by mouth 2 (two) times daily.   Multiple Vitamins-Minerals (OCUVITE PRESERVISION PO) Take 1 tablet by mouth 2 (two) times daily.   pantoprazole  (PROTONIX) 40 MG tablet Take 40 mg by mouth 2 (two) times daily.   polyethylene glycol powder (GLYCOLAX/MIRALAX) powder Take 17 g by mouth daily. PRN     predniSONE (DELTASONE) 20 MG tablet    promethazine (PHENERGAN) 25 MG tablet Take 25 mg by mouth 3 (three) times daily as needed.   raloxifene (EVISTA) 60 MG tablet Take 60 mg by mouth daily.     sucralfate (CARAFATE) 1 GM/10ML suspension Take 10 mLs (1 g total) by mouth 4 (four) times daily -  with meals and at bedtime.   warfarin (COUMADIN) 5 MG tablet TAKE 1/2 TO 1 TABLET BY MOUTH ONCE DAILY AS DIRECTED BY COUMADIN CLINIC   No current facility-administered medications for this visit. (Other)      REVIEW OF SYSTEMS:    ALLERGIES Allergies  Allergen Reactions   Metoclopramide Hcl     REACTION: nervous, climbs the wall   Penicillins     REACTION: hives   Pravastatin    Sulfonamide Derivatives     REACTION: rash    PAST MEDICAL HISTORY Past Medical History:  Diagnosis Date   Atrial fibrillation ([place])    ATRIAL FIBRILLATION S/P  PVI 6/11   CAD (coronary artery disease)    a. 03/2002: cath showing 20% OM1 stenosis, EF at 65%, no WMA. No significant  CAD.    GERD (gastroesophageal reflux disease)    Hyperlipidemia    Hypertension    Macular degeneration    SVT (supraventricular tachycardia) (HCC)    Past Surgical History:  Procedure Laterality Date   ABLATION OF DYSRHYTHMIC FOCUS  09/2009   s/p PVI by JA   TOTAL ABDOMINAL HYSTERECTOMY      FAMILY HISTORY Family History  Problem Relation Age of Onset   Stroke Other    Coronary artery disease Other     SOCIAL HISTORY Social History   Tobacco Use   Smoking status: Never   Smokeless tobacco: Never  Vaping Use   Vaping Use: Never used  Substance Use Topics   Alcohol use: No   Drug use: No         OPHTHALMIC EXAM:  Base Eye Exam     Visual Acuity (ETDRS)       Right Left   Dist cc CF@face  20/60   Dist ph cc NI NI    Correction: Glasses          Tonometry (Tonopen, 2:20 PM)       Right Left   Pressure 11 13         Pupils       Dark Light Shape React APD   Right 2 2 Round Minimal None   Left 2 2 Round Minimal None         Visual Fields (Counting fingers)       Left Right    Full Full         Extraocular Movement       Right Left    Full, Ortho Full, Ortho         Neuro/Psych     Oriented x3: Yes   Mood/Affect: Normal         Dilation     Both eyes: 1.0% Mydriacyl, 2.5% Phenylephrine @ 2:20 PM           Slit Lamp and Fundus Exam     External Exam       Right Left   External Normal Normal         Slit Lamp Exam       Right Left   Lids/Lashes Normal Normal   Conjunctiva/Sclera White and quiet White and quiet   Cornea Clear Clear   Anterior Chamber Deep and quiet Deep and quiet   Iris Round and reactive Round and reactive   Lens Posterior chamber intraocular lens Posterior chamber intraocular lens   Anterior Vitreous Normal Normal         Fundus Exam       Right Left   Posterior Vitreous Posterior vitreous detachment Posterior vitreous detachment   Disc Normal Normal   C/D Ratio 0.1 0.1   Macula Exudates, Macular thickening, Subretinal hemorrhage Age related macular degeneration, Atrophy into faz, advanced age related macular degeneration, Retinal pigment epithelial atrophy, Geographic atrophy in the FAZ,    Vessels Normal Normal   Periphery Normal Normal            IMAGING AND PROCEDURES  Imaging and Procedures for 02/12/21  OCT, Retina - OU - Both Eyes       Right Eye Quality was good. Scan locations included subfoveal. Central Foveal Thickness: 754. Progression has been stable. Findings include abnormal foveal contour, retinal drusen , cystoid macular edema, intraretinal fluid, disciform scar.   Left Eye Quality was good. Scan locations included subfoveal. Central Foveal Thickness: 255. Progression has improved.  Findings include abnormal foveal contour,  intraretinal hyper-reflective material, no SRF, no IRF.   Notes At 5-week follow-up today, increased CME increased fluid and increased thickness left eye from reactivation of CNVM.  Will need repeat Eylea today and examination again in 5 to 6-week  OD with chronic subfoveal disciform scar, chronic CME intraretinal fluid, now with retinal hemorrhage enlargement.  We will need to treat with Avastin OD to prevent scotoma enlargement  Stable     Intravitreal Injection, Pharmacologic Agent - OS - Left Eye       Time Out 02/12/2021. 2:42 PM. Confirmed correct patient, procedure, site, and patient consented.   Anesthesia Topical anesthesia was used. Anesthetic medications included Lidocaine 4%.   Procedure Preparation included Tobramycin 0.3%, Ofloxacin , 10% betadine to eyelids, 5% betadine to ocular surface. A 30 gauge needle was used.   Injection: 2 mg aflibercept 2 MG/0.05ML   Route: Intravitreal, Site: Left Eye   NDC: [id number], Lot: [id number], Waste: 0 mL   Post-op Post injection exam found visual acuity of at least counting fingers. The patient tolerated the procedure well. There were no complications. The patient received written and verbal post procedure care education. Post injection medications included ocuflox.   Notes OS vastly improved macular findings.  Stable macular findings at 6 weeks postinjection.  We will repeat injection today and extend interval OS next 8 weeks     Intravitreal Injection, Pharmacologic Agent - OD - Right Eye       Time Out 02/12/2021. 2:43 PM. Confirmed correct patient, procedure, site, and patient consented.   Anesthesia Topical anesthesia was used. Anesthetic medications included Lidocaine 4%.   Procedure Preparation included 5% betadine to ocular surface, 10% betadine to eyelids, Tobramycin 0.3%. A 30 gauge needle was used.   Injection: 2.5 mg bevacizumab 2.5 MG/0.1ML   Route: Intravitreal, Site: Right Eye   NDC: [phone],  Lot: [id number]   Post-op Post injection exam found visual acuity of at least counting fingers. The patient tolerated the procedure well. There were no complications. The patient received written and verbal post procedure care education. Post injection medications were not given.              ASSESSMENT/PLAN:  No problem-specific Assessment & Plan notes found for this encounter.      ICD-10-CM   1. Exudative age-related macular degeneration of left eye with active choroidal neovascularization (HCC)  H35.3221 OCT, Retina - OU - Both Eyes    Intravitreal Injection, Pharmacologic Agent - OS - Left Eye    aflibercept (EYLEA) SOLN 2 mg    2. Exudative age-related macular degeneration of right eye with active choroidal neovascularization (HCC)  H35.3211 Intravitreal Injection, Pharmacologic Agent - OD - Right Eye    bevacizumab (AVASTIN) SOSY 2.5 mg      1.  OD follow-up today, with signs of subfoveal large disciform scar reactivated with subretinal hemorrhage beyond area of previous exudate, with risk of scotoma enlargement we will commence therapy today with intravitreal Avastin, follow-up coincident with next examination left eye  2.  OS today, doing well on intravitreal Eylea.  At 6-week interval repeat injection today and follow-up next in 8 weeks  3.  Ophthalmic Meds Ordered this visit:  Meds ordered this encounter  Medications   aflibercept (EYLEA) SOLN 2 mg   bevacizumab (AVASTIN) SOSY 2.5 mg       Return in about 8 weeks (around 04/09/2021) for DILATE OU, AVASTIN OCT, OD, ,,,,,,EYLEA OCT, OS.  There are no Patient  Instructions on file for this visit.   Explained the diagnoses, plan, and follow up with the patient and they expressed understanding.  Patient expressed understanding of the importance of proper follow up care.   Clent Demark Camdyn Beske M.D. Diseases & Surgery of the Retina and Vitreous Retina & Diabetic [place] 02/12/21     Abbreviations: M myopia  (nearsighted); A astigmatism; H hyperopia (farsighted); P presbyopia; Mrx spectacle prescription;  CTL contact lenses; OD right eye; OS left eye; OU both eyes  XT exotropia; ET esotropia; PEK punctate epithelial keratitis; PEE punctate epithelial erosions; DES dry eye syndrome; MGD meibomian gland dysfunction; ATs artificial tears; PFAT's preservative free artificial tears; [place] nuclear sclerotic cataract; PSC posterior subcapsular cataract; ERM epi-retinal membrane; PVD posterior vitreous detachment; RD retinal detachment; DM diabetes mellitus; DR diabetic retinopathy; NPDR non-proliferative diabetic retinopathy; PDR proliferative diabetic retinopathy; CSME clinically significant macular edema; DME diabetic macular edema; dbh dot blot hemorrhages; CWS cotton wool spot; POAG primary open angle glaucoma; C/D cup-to-disc ratio; HVF humphrey visual field; GVF goldmann visual field; OCT optical coherence tomography; IOP intraocular pressure; BRVO Branch retinal vein occlusion; CRVO central retinal vein occlusion; CRAO central retinal artery occlusion; BRAO branch retinal artery occlusion; RT retinal tear; SB scleral buckle; PPV pars plana vitrectomy; VH Vitreous hemorrhage; PRP panretinal laser photocoagulation; IVK intravitreal kenalog; VMT vitreomacular traction; MH Macular hole;  NVD neovascularization of the disc; NVE neovascularization elsewhere; AREDS age related eye disease study; ARMD age related macular degeneration; POAG primary open angle glaucoma; EBMD epithelial/anterior basement membrane dystrophy; ACIOL anterior chamber intraocular lens; IOL intraocular lens; PCIOL posterior chamber intraocular lens; Phaco/IOL phacoemulsification with intraocular lens placement; [place] photorefractive keratectomy; LASIK laser assisted in situ keratomileusis; HTN hypertension; DM diabetes mellitus; COPD chronic obstructive pulmonary disease

## 2021-02-28 DIAGNOSIS — I4891 Unspecified atrial fibrillation: Secondary | ICD-10-CM | POA: Diagnosis not present

## 2021-02-28 DIAGNOSIS — Z23 Encounter for immunization: Secondary | ICD-10-CM | POA: Diagnosis not present

## 2021-02-28 DIAGNOSIS — Z7901 Long term (current) use of anticoagulants: Secondary | ICD-10-CM | POA: Diagnosis not present

## 2021-03-21 ENCOUNTER — Other Ambulatory Visit: Payer: Self-pay

## 2021-03-21 ENCOUNTER — Ambulatory Visit (INDEPENDENT_AMBULATORY_CARE_PROVIDER_SITE_OTHER): Payer: Medicare Other | Admitting: Orthopaedic Surgery

## 2021-03-21 ENCOUNTER — Encounter: Payer: Self-pay | Admitting: Orthopaedic Surgery

## 2021-03-21 DIAGNOSIS — M1712 Unilateral primary osteoarthritis, left knee: Secondary | ICD-10-CM | POA: Diagnosis not present

## 2021-03-21 DIAGNOSIS — M170 Bilateral primary osteoarthritis of knee: Secondary | ICD-10-CM

## 2021-03-21 MED ORDER — METHYLPREDNISOLONE ACETATE 40 MG/ML IJ SUSP
80.0000 mg | INTRAMUSCULAR | Status: AC | PRN
  Administered 2021-03-21: 80 mg via INTRA_ARTICULAR

## 2021-03-21 MED ORDER — BUPIVACAINE HCL 0.25 % IJ SOLN
2.0000 mL | INTRAMUSCULAR | Status: AC | PRN
  Administered 2021-03-21: 2 mL via INTRA_ARTICULAR

## 2021-03-21 MED ORDER — LIDOCAINE HCL 1 % IJ SOLN
2.0000 mL | INTRAMUSCULAR | Status: AC | PRN
Start: 2021-03-21 — End: 2021-03-21
  Administered 2021-03-21: 2 mL

## 2021-03-21 NOTE — Progress Notes (Signed)
Office Visit Note   Patient: Sylvia [name]           Date of Birth: 1932/06/02           MRN: [id number] Visit Date: 03/21/2021              Requested by: Leanna Battles, MD [place],  [place] [address] PCP: Leanna Battles, MD   Assessment & Plan: Visit Diagnoses:  1. Bilateral primary osteoarthritis of knee     Plan: Mrs. Cobern is an established diagnosis of osteoarthritis in both knees.  She has had prior cortisone injections with excellent relief.  Presently she is experiencing more discomfort in her left knee.  She has had a little bit of swelling in and mostly medial joint pain.  We will reinject it with Depo-Medrol today and monitor response  Follow-Up Instructions: Return if symptoms worsen or fail to improve.   Orders:  No orders of the defined types were placed in this encounter.  No orders of the defined types were placed in this encounter.     Procedures: Large Joint Inj: L knee on 03/21/2021 3:37 PM Indications: pain and diagnostic evaluation Details: 25 G 1.5 in needle, anteromedial approach  Arthrogram: No  Medications: 2 mL lidocaine 1 %; 80 mg methylPREDNISolone acetate 40 MG/ML; 2 mL bupivacaine 0.25 % Procedure, treatment alternatives, risks and benefits explained, specific risks discussed. Consent was given by the patient. Patient was prepped and draped in the usual sterile fashion.      Clinical Data: No additional findings.   Subjective: Chief Complaint  Patient presents with   Left Knee - Pain  Patient presents today for left knee pain. She said that she would like to get a cortisone injection today.  HPI  Review of Systems   Objective: Vital Signs: There were no vitals taken for this visit.  Physical Exam Constitutional:      Appearance: She is well-developed.  Pulmonary:     Effort: Pulmonary effort is normal.  Skin:    General: Skin is warm and dry.  Neurological:     Mental Status: She is alert and  oriented to person, place, and time.  Psychiatric:        Behavior: Behavior normal.    Ortho Exam awake alert and oriented x3.  Comfortable sitting.  Left knee with full extension and flexion at least 100 degrees there is no instability.  Mostly medial joint pain but a little bit of lateral pain.  There was some patella crepitation but no particular pain or discomfort with patella compression.  Small effusion.  The knee was not particularly warm.  No popliteal pain.  No calf pain.  Painless range of motion of her hip  Specialty Comments:  No specialty comments available.  Imaging: No results found.   PMFS History: Patient Active Problem List   Diagnosis Date Noted   Bilateral primary osteoarthritis of knee 01/11/2020   Advanced nonexudative age-related macular degeneration of both eyes with subfoveal involvement 12/06/2019   Exudative age-related macular degeneration of left eye with active choroidal neovascularization ([place]) 07/21/2019   Exudative age-related macular degeneration of right eye with inactive choroidal neovascularization ([place]) 07/21/2019   Cystoid macular edema of right eye 07/21/2019   Posterior vitreous detachment of left eye 07/21/2019   Cervical spondylosis without myelopathy 01/18/2019   Spondylolisthesis of cervical region 01/18/2019   Obstructive sleep apnea treated with continuous positive airway pressure (CPAP) 06/02/2018   Unilateral primary osteoarthritis, right knee 03/02/2017  Unilateral primary osteoarthritis, right knee 03/02/2017   Long term (current) use of anticoagulants 07/26/2010   FATIGUE 05/23/2010   HYPERCHOLESTEROLEMIA, PURE 02/13/2008   HYPERTENSION, BENIGN 02/13/2008   SVT/ PSVT/ PAT 02/13/2008   ATRIAL FIBRILLATION 02/13/2008   GERD 02/13/2008   PALPITATIONS 02/13/2008   Past Medical History:  Diagnosis Date   Atrial fibrillation ([place])    ATRIAL FIBRILLATION S/P  PVI 6/11   CAD (coronary artery disease)    a. 03/2002: cath showing  20% OM1 stenosis, EF at 65%, no WMA. No significant CAD.    GERD (gastroesophageal reflux disease)    Hyperlipidemia    Hypertension    Macular degeneration    SVT (supraventricular tachycardia) (HCC)     Family History  Problem Relation Age of Onset   Stroke Other    Coronary artery disease Other     Past Surgical History:  Procedure Laterality Date   ABLATION OF DYSRHYTHMIC FOCUS  09/2009   s/p PVI by JA   TOTAL ABDOMINAL HYSTERECTOMY     Social History   Occupational History   Not on file  Tobacco Use   Smoking status: Never   Smokeless tobacco: Never  Vaping Use   Vaping Use: Never used  Substance and Sexual Activity   Alcohol use: No   Drug use: No   Sexual activity: Not on file

## 2021-04-04 DIAGNOSIS — I4891 Unspecified atrial fibrillation: Secondary | ICD-10-CM | POA: Diagnosis not present

## 2021-04-04 DIAGNOSIS — Z7901 Long term (current) use of anticoagulants: Secondary | ICD-10-CM | POA: Diagnosis not present

## 2021-04-09 ENCOUNTER — Encounter (INDEPENDENT_AMBULATORY_CARE_PROVIDER_SITE_OTHER): Payer: Medicare Other | Admitting: Ophthalmology

## 2021-04-13 NOTE — Progress Notes (Signed)
HPI:FU permanent atrial fibrillation and SVT. Last echocardiogram in June of 2011 showed normal LV function. There was trivial aortic and mitral regurgitation. Also note she had a cardiac catheterization in December 2003 that showed a 20% first obtuse marginal but otherwise no obstructive disease. She was referred for atrial fibrillation ablation. She had this procedure in June of 2011. Following the procedure she did have recurrent atrial fibrillation. She was seen by Dr. Rayann Heman and consideration of repeat ablation was discussed. However the decision was made for rate control and anticoagulation. 24-hour Holter monitor in December 2015 showed rate was controlled. Nuclear study April 2018 showed ejection fraction 56% and no ischemia. Since I last saw her she states she has fatigue and some dyspnea on exertion since being infected with COVID 2 years ago.  No orthopnea, PND, chest pain, palpitations or syncope.  Current Outpatient Medications  Medication Sig Dispense Refill   acetaminophen (TYLENOL) 325 MG tablet Take by mouth every 6 (six) hours as needed for mild pain.     amLODipine (NORVASC) 2.5 MG tablet TAKE 2 TABLETS BY MOUTH IN THE MORNING AND 1 IN THE EVENING     calcium citrate-vitamin D (CITRACAL+D) 315-200 MG-UNIT per tablet Take 1 tablet by mouth daily.     diclofenac sodium (VOLTAREN) 1 % GEL Apply 4 g topically 4 (four) times daily. 5 Tube 3   fluocinonide (LIDEX) 0.05 % external solution 2 (two) times daily. 1-2 GTTS IN EARS TWICE WEEKLY AS NEEDED      hydroxypropyl methylcellulose / hypromellose (ISOPTO TEARS / GONIOVISC) 2.5 % ophthalmic solution Place 1 drop into both eyes as needed for dry eyes.     levothyroxine (SYNTHROID, LEVOTHROID) 75 MCG tablet Take 75 mcg by mouth daily.       losartan (COZAAR) 100 MG tablet Take 100 mg by mouth daily.       metoprolol tartrate (LOPRESSOR) 50 MG tablet Take 50 mg by mouth 2 (two) times daily.     Multiple Vitamins-Minerals (OCUVITE  PRESERVISION PO) Take 1 tablet by mouth 2 (two) times daily.     pantoprazole (PROTONIX) 40 MG tablet Take 40 mg by mouth 2 (two) times daily.     polyethylene glycol powder (GLYCOLAX/MIRALAX) powder Take 17 g by mouth daily. PRN       predniSONE (DELTASONE) 20 MG tablet      promethazine (PHENERGAN) 25 MG tablet Take 25 mg by mouth 3 (three) times daily as needed.  0   raloxifene (EVISTA) 60 MG tablet Take 60 mg by mouth daily.       sucralfate (CARAFATE) 1 GM/10ML suspension Take 10 mLs (1 g total) by mouth 4 (four) times daily -  with meals and at bedtime. 420 mL 0   warfarin (COUMADIN) 5 MG tablet TAKE 1/2 TO 1 TABLET BY MOUTH ONCE DAILY AS DIRECTED BY COUMADIN CLINIC 90 tablet 0   No current facility-administered medications for this visit.     Past Medical History:  Diagnosis Date   Atrial fibrillation ([place])    ATRIAL FIBRILLATION S/P  PVI 6/11   CAD (coronary artery disease)    a. 03/2002: cath showing 20% OM1 stenosis, EF at 65%, no WMA. No significant CAD.    GERD (gastroesophageal reflux disease)    Hyperlipidemia    Hypertension    Macular degeneration    SVT (supraventricular tachycardia) (HCC)     Past Surgical History:  Procedure Laterality Date   ABLATION OF DYSRHYTHMIC FOCUS  09/2009  s/p PVI by JA   TOTAL ABDOMINAL HYSTERECTOMY      Social History   Socioeconomic History   Marital status: Married    Spouse name: Not on file   Number of children: Not on file   Years of education: Not on file   Highest education level: Not on file  Occupational History   Not on file  Tobacco Use   Smoking status: Never   Smokeless tobacco: Never  Vaping Use   Vaping Use: Never used  Substance and Sexual Activity   Alcohol use: No   Drug use: No   Sexual activity: Not on file  Other Topics Concern   Not on file  Social History Narrative   Lives with spouse   Social Determinants of Health   Financial Resource Strain: Not on file  Food Insecurity: Not on file   Transportation Needs: Not on file  Physical Activity: Not on file  Stress: Not on file  Social Connections: Not on file  Intimate Partner Violence: Not on file    Family History  Problem Relation Age of Onset   Stroke Other    Coronary artery disease Other     ROS: no fevers or chills, productive cough, hemoptysis, dysphasia, odynophagia, melena, hematochezia, dysuria, hematuria, rash, seizure activity, orthopnea, PND, pedal edema, claudication. Remaining systems are negative.  Physical Exam: Well-developed well-nourished in no acute distress.  Skin is warm and dry.  HEENT is normal.  Neck is supple.  Chest is clear to auscultation with normal expansion.  Cardiovascular exam is irregular Abdominal exam nontender or distended. No masses palpated. Extremities show no edema. neuro grossly intact  ECG-atrial fibrillation at a rate of 73, no ST changes.  Personally reviewed  A/P  1 permanent atrial fibrillation-plan to continue metoprolol for rate control.  Continue Coumadin with goal INR 2-3.  She declines DOAC's.  She does describe some fatigue and dyspnea but the symptoms have been present since COVID infection 2 years ago.  We will arrange an echocardiogram to assess LV function.  If normal we will not pursue further evaluation.  2 hypertension-blood pressure elevated but she states typically controlled at home.  They will follow this and we will advance medications if needed.  3 history of SVT-no recurrences based on history.  We will continue beta-blocker at present dose.  Kirk Ruths, MD

## 2021-04-16 ENCOUNTER — Encounter (INDEPENDENT_AMBULATORY_CARE_PROVIDER_SITE_OTHER): Payer: Self-pay | Admitting: Ophthalmology

## 2021-04-16 ENCOUNTER — Other Ambulatory Visit: Payer: Self-pay

## 2021-04-16 ENCOUNTER — Ambulatory Visit (INDEPENDENT_AMBULATORY_CARE_PROVIDER_SITE_OTHER): Payer: Medicare Other | Admitting: Ophthalmology

## 2021-04-16 DIAGNOSIS — H353212 Exudative age-related macular degeneration, right eye, with inactive choroidal neovascularization: Secondary | ICD-10-CM

## 2021-04-16 DIAGNOSIS — H353211 Exudative age-related macular degeneration, right eye, with active choroidal neovascularization: Secondary | ICD-10-CM

## 2021-04-16 DIAGNOSIS — H353221 Exudative age-related macular degeneration, left eye, with active choroidal neovascularization: Secondary | ICD-10-CM

## 2021-04-16 MED ORDER — BEVACIZUMAB 2.5 MG/0.1ML IZ SOSY
2.5000 mg | PREFILLED_SYRINGE | INTRAVITREAL | Status: AC | PRN
  Administered 2021-04-16: 2.5 mg via INTRAVITREAL

## 2021-04-16 MED ORDER — AFLIBERCEPT 2MG/0.05ML IZ SOLN FOR KALEIDOSCOPE
2.0000 mg | INTRAVITREAL | Status: AC | PRN
  Administered 2021-04-16: 2 mg via INTRAVITREAL

## 2021-04-16 NOTE — Progress Notes (Signed)
04/16/2021     CHIEF COMPLAINT Patient presents for  Chief Complaint  Patient presents with   Retina Follow Up      HISTORY OF PRESENT ILLNESS: Sylvia [name] is a 88 y.o. female who presents to the clinic today for:   HPI     Retina Follow Up           Diagnosis: Wet AMD   Laterality: both eyes   Onset: 8 weeks ago   Duration: 8 weeks   Course: stable         Comments   8 week fu OU oct Eylea OS and Avastin OD. Pt states "my left eye is a little different a little, it is blurred." Denies new FOL or floaters.      Last edited by Laurin Coder on 04/16/2021  2:19 PM.      Referring physician: Donnajean Lopes, MD [place],  [place] [address]  HISTORICAL INFORMATION:   Selected notes from the MEDICAL RECORD NUMBER       CURRENT MEDICATIONS: Current Outpatient Medications (Ophthalmic Drugs)  Medication Sig   hydroxypropyl methylcellulose / hypromellose (ISOPTO TEARS / GONIOVISC) 2.5 % ophthalmic solution Place 1 drop into both eyes as needed for dry eyes.   No current facility-administered medications for this visit. (Ophthalmic Drugs)   Current Outpatient Medications (Other)  Medication Sig   acetaminophen (TYLENOL) 325 MG tablet Take by mouth every 6 (six) hours as needed for mild pain.   amLODipine (NORVASC) 2.5 MG tablet TAKE 2 TABLETS BY MOUTH IN THE MORNING AND 1 IN THE EVENING   calcium citrate-vitamin D (CITRACAL+D) 315-200 MG-UNIT per tablet Take 1 tablet by mouth daily.   diclofenac sodium (VOLTAREN) 1 % GEL Apply 4 g topically 4 (four) times daily.   fluocinonide (LIDEX) 0.05 % external solution 2 (two) times daily. 1-2 GTTS IN EARS TWICE WEEKLY AS NEEDED    levothyroxine (SYNTHROID, LEVOTHROID) 75 MCG tablet Take 75 mcg by mouth daily.     losartan (COZAAR) 100 MG tablet Take 100 mg by mouth daily.     metoprolol tartrate (LOPRESSOR) 50 MG tablet Take 50 mg by mouth 2 (two) times daily.   Multiple Vitamins-Minerals (OCUVITE  PRESERVISION PO) Take 1 tablet by mouth 2 (two) times daily.   pantoprazole (PROTONIX) 40 MG tablet Take 40 mg by mouth 2 (two) times daily.   polyethylene glycol powder (GLYCOLAX/MIRALAX) powder Take 17 g by mouth daily. PRN     predniSONE (DELTASONE) 20 MG tablet    promethazine (PHENERGAN) 25 MG tablet Take 25 mg by mouth 3 (three) times daily as needed.   raloxifene (EVISTA) 60 MG tablet Take 60 mg by mouth daily.     sucralfate (CARAFATE) 1 GM/10ML suspension Take 10 mLs (1 g total) by mouth 4 (four) times daily -  with meals and at bedtime.   warfarin (COUMADIN) 5 MG tablet TAKE 1/2 TO 1 TABLET BY MOUTH ONCE DAILY AS DIRECTED BY COUMADIN CLINIC   No current facility-administered medications for this visit. (Other)      REVIEW OF SYSTEMS:    ALLERGIES Allergies  Allergen Reactions   Metoclopramide Hcl     REACTION: nervous, climbs the wall   Penicillins     REACTION: hives   Pravastatin    Sulfonamide Derivatives     REACTION: rash    PAST MEDICAL HISTORY Past Medical History:  Diagnosis Date   Atrial fibrillation (HCC)    ATRIAL FIBRILLATION S/P  PVI 6/11   CAD (coronary artery disease)    a. 03/2002: cath showing 20% OM1 stenosis, EF at 65%, no WMA. No significant CAD.    GERD (gastroesophageal reflux disease)    Hyperlipidemia    Hypertension    Macular degeneration    SVT (supraventricular tachycardia) (HCC)    Past Surgical History:  Procedure Laterality Date   ABLATION OF DYSRHYTHMIC FOCUS  09/2009   s/p PVI by JA   TOTAL ABDOMINAL HYSTERECTOMY      FAMILY HISTORY Family History  Problem Relation Age of Onset   Stroke Other    Coronary artery disease Other     SOCIAL HISTORY Social History   Tobacco Use   Smoking status: Never   Smokeless tobacco: Never  Vaping Use   Vaping Use: Never used  Substance Use Topics   Alcohol use: No   Drug use: No         OPHTHALMIC EXAM:  Base Eye Exam     Visual Acuity (ETDRS)       Right Left    Dist [place]  20/40 -2   Dist cc CF at face 20/70 -1   Dist ph cc  20/60 -2    Correction: Glasses         Tonometry (Tonopen, 2:24 PM)       Right Left   Pressure 14 10         Pupils       Pupils Dark Light React APD   Right PERRL 2 2 Minimal None   Left PERRL 2 2 Minimal None         Extraocular Movement       Right Left    Full Full         Neuro/Psych     Oriented x3: Yes   Mood/Affect: Normal         Dilation     Both eyes: 1.0% Mydriacyl, 2.5% Phenylephrine @ 2:24 PM           Slit Lamp and Fundus Exam     External Exam       Right Left   External Normal Normal         Slit Lamp Exam       Right Left   Lids/Lashes Normal Normal   Conjunctiva/Sclera White and quiet White and quiet   Cornea Clear Clear   Anterior Chamber Deep and quiet Deep and quiet   Iris Round and reactive Round and reactive   Lens Posterior chamber intraocular lens Posterior chamber intraocular lens   Anterior Vitreous Normal Normal         Fundus Exam       Right Left   Posterior Vitreous Posterior vitreous detachment Posterior vitreous detachment   Disc Normal Normal   C/D Ratio 0.1 0.1   Macula Exudates, Macular thickening, Subretinal hemorrhage Age related macular degeneration, Atrophy into faz, advanced age related macular degeneration, Retinal pigment epithelial atrophy, Geographic atrophy in the FAZ,    Vessels Normal Normal   Periphery Normal Normal            IMAGING AND PROCEDURES  Imaging and Procedures for 04/16/21  Intravitreal Injection, Pharmacologic Agent - OD - Right Eye       Time Out 04/16/2021. 2:46 PM. Confirmed correct patient, procedure, site, and patient consented.   Anesthesia Topical anesthesia was used. Anesthetic medications included Lidocaine 4%.   Procedure Preparation included 5% betadine to ocular surface, 10% betadine to eyelids,  Tobramycin 0.3%. A 30 gauge needle was used.   Injection: 2.5 mg bevacizumab 2.5  MG/0.1ML   Route: Intravitreal, Site: Right Eye   NDC: [phone], Lot: [id number]   Post-op Post injection exam found visual acuity of at least counting fingers. The patient tolerated the procedure well. There were no complications. The patient received written and verbal post procedure care education. Post injection medications were not given.      OCT, Retina - OU - Both Eyes       Right Eye Quality was good. Scan locations included subfoveal. Central Foveal Thickness: 811. Progression has worsened. Findings include abnormal foveal contour, retinal drusen , cystoid macular edema, intraretinal fluid, disciform scar.   Left Eye Quality was good. Scan locations included subfoveal. Central Foveal Thickness: 289. Progression has been stable. Findings include abnormal foveal contour, intraretinal hyper-reflective material, no SRF, no IRF.   Notes At 9-week follow-up today, increased CME increased fluid and increased thickness left eye from reactivation of CNVM.  Will need repeat Eylea today and examination again in 8 -week  OD with chronic subfoveal disciform scar, chronic CME intraretinal fluid, now with retinal hemorrhage enlargement.  We will need to treat with Avastin OD to prevent scotoma enlargement  Stable     Intravitreal Injection, Pharmacologic Agent - OS - Left Eye       Time Out 04/16/2021. 2:45 PM. Confirmed correct patient, procedure, site, and patient consented.   Anesthesia Topical anesthesia was used. Anesthetic medications included Lidocaine 4%.   Procedure Preparation included Tobramycin 0.3%, Ofloxacin , 10% betadine to eyelids, 5% betadine to ocular surface. A 30 gauge needle was used.   Injection: 2 mg aflibercept 2 MG/0.05ML   Route: Intravitreal, Site: Left Eye   NDC: A3590391, Lot: [id number], Waste: 0 mL   Post-op Post injection exam found visual acuity of at least counting fingers. The patient tolerated the procedure well. There were no  complications. The patient received written and verbal post procedure care education. Post injection medications included ocuflox.   Notes OS vastly improved macular findings.  Stable macular findings at 6 weeks postinjection.  We will repeat injection today and extend interval OS next 8 weeks             ASSESSMENT/PLAN:  Exudative age-related macular degeneration of right eye with inactive choroidal neovascularization (HCC) OD with subfoveal chronic massive CME and thickening, controlled currently at 9-week interval on Avastin prevent enlargement of scotoma  Repeat injection today  Exudative age-related macular degeneration of left eye with active choroidal neovascularization ([place]) Today at 9-week follow-up, on Eylea.  Repeat injection today and follow-up next in 8 weeks     ICD-10-CM   1. Exudative age-related macular degeneration of right eye with active choroidal neovascularization (HCC)  H35.3211 Intravitreal Injection, Pharmacologic Agent - OD - Right Eye    OCT, Retina - OU - Both Eyes    bevacizumab (AVASTIN) SOSY 2.5 mg    2. Exudative age-related macular degeneration of left eye with active choroidal neovascularization (HCC)  H35.3221 OCT, Retina - OU - Both Eyes    Intravitreal Injection, Pharmacologic Agent - OS - Left Eye    aflibercept (EYLEA) SOLN 2 mg    3. Exudative age-related macular degeneration of right eye with inactive choroidal neovascularization (HCC)  H35.3212       1.  OS, chronic active CNVM, currently at 9-week interval repeat injection Eylea today to maintain and follow-up in 8 weeks OS  2.  Dilate OU next only treat  OS with Eylea next planned or possible  3.  Ophthalmic Meds Ordered this visit:  Meds ordered this encounter  Medications   aflibercept (EYLEA) SOLN 2 mg   bevacizumab (AVASTIN) SOSY 2.5 mg       Return in about 8 weeks (around 06/11/2021) for DILATE OU, COLOR FP, EYLEA OCT, OS.  There are no Patient Instructions on file  for this visit.   Explained the diagnoses, plan, and follow up with the patient and they expressed understanding.  Patient expressed understanding of the importance of proper follow up care.   Clent Demark Keno Caraway M.D. Diseases & Surgery of the Retina and Vitreous Retina & Diabetic [place] 04/16/21     Abbreviations: M myopia (nearsighted); A astigmatism; H hyperopia (farsighted); P presbyopia; Mrx spectacle prescription;  CTL contact lenses; OD right eye; OS left eye; OU both eyes  XT exotropia; ET esotropia; PEK punctate epithelial keratitis; PEE punctate epithelial erosions; DES dry eye syndrome; MGD meibomian gland dysfunction; ATs artificial tears; PFAT's preservative free artificial tears; [place] nuclear sclerotic cataract; PSC posterior subcapsular cataract; ERM epi-retinal membrane; PVD posterior vitreous detachment; RD retinal detachment; DM diabetes mellitus; DR diabetic retinopathy; NPDR non-proliferative diabetic retinopathy; PDR proliferative diabetic retinopathy; CSME clinically significant macular edema; DME diabetic macular edema; dbh dot blot hemorrhages; CWS cotton wool spot; POAG primary open angle glaucoma; C/D cup-to-disc ratio; HVF humphrey visual field; GVF goldmann visual field; OCT optical coherence tomography; IOP intraocular pressure; BRVO Branch retinal vein occlusion; CRVO central retinal vein occlusion; CRAO central retinal artery occlusion; BRAO branch retinal artery occlusion; RT retinal tear; SB scleral buckle; PPV pars plana vitrectomy; VH Vitreous hemorrhage; PRP panretinal laser photocoagulation; IVK intravitreal kenalog; VMT vitreomacular traction; MH Macular hole;  NVD neovascularization of the disc; NVE neovascularization elsewhere; AREDS age related eye disease study; ARMD age related macular degeneration; POAG primary open angle glaucoma; EBMD epithelial/anterior basement membrane dystrophy; ACIOL anterior chamber intraocular lens; IOL intraocular lens; PCIOL  posterior chamber intraocular lens; Phaco/IOL phacoemulsification with intraocular lens placement; [place] photorefractive keratectomy; LASIK laser assisted in situ keratomileusis; HTN hypertension; DM diabetes mellitus; COPD chronic obstructive pulmonary disease

## 2021-04-16 NOTE — Assessment & Plan Note (Signed)
OD with subfoveal chronic massive CME and thickening, controlled currently at 9-week interval on Avastin prevent enlargement of scotoma  Repeat injection today

## 2021-04-16 NOTE — Assessment & Plan Note (Signed)
Today at 9-week follow-up, on Eylea.  Repeat injection today and follow-up next in 8 weeks

## 2021-04-23 ENCOUNTER — Other Ambulatory Visit: Payer: Self-pay

## 2021-04-23 ENCOUNTER — Encounter: Payer: Self-pay | Admitting: Cardiology

## 2021-04-23 ENCOUNTER — Ambulatory Visit (INDEPENDENT_AMBULATORY_CARE_PROVIDER_SITE_OTHER): Payer: Medicare Other | Admitting: Cardiology

## 2021-04-23 VITALS — BP 172/60 | HR 73 | Ht 61.5 in | Wt 148.6 lb

## 2021-04-23 DIAGNOSIS — I471 Supraventricular tachycardia, unspecified: Secondary | ICD-10-CM

## 2021-04-23 DIAGNOSIS — R0602 Shortness of breath: Secondary | ICD-10-CM | POA: Diagnosis not present

## 2021-04-23 DIAGNOSIS — I4821 Permanent atrial fibrillation: Secondary | ICD-10-CM | POA: Diagnosis not present

## 2021-04-23 DIAGNOSIS — I10 Essential (primary) hypertension: Secondary | ICD-10-CM | POA: Diagnosis not present

## 2021-04-23 NOTE — Patient Instructions (Signed)
°  Testing/Procedures:  Your physician has requested that you have an echocardiogram. Echocardiography is a painless test that uses sound waves to create images of your heart. It provides your doctor with information about the size and shape of your heart and how well your hearts chambers and valves are working. This procedure takes approximately one hour. There are no restrictions for this procedure. [place]   Follow-Up: At [facility], you and your health needs are our priority.  As part of our continuing mission to provide you with exceptional heart care, we have created designated Provider Care Teams.  These Care Teams include your primary Cardiologist (physician) and Advanced Practice Providers (APPs -  Physician Assistants and Nurse Practitioners) who all work together to provide you with the care you need, when you need it.  We recommend signing up for the patient portal called "MyChart".  Sign up information is provided on this After Visit Summary.  MyChart is used to connect with patients for Virtual Visits (Telemedicine).  Patients are able to view lab/test results, encounter notes, upcoming appointments, etc.  Non-urgent messages can be sent to your provider as well.   To learn more about what you can do with MyChart, go to [email or web address].    Your next appointment:   12 month(s)  The format for your next appointment:   In Person  Provider:   Kirk Ruths MD

## 2021-05-02 DIAGNOSIS — E039 Hypothyroidism, unspecified: Secondary | ICD-10-CM | POA: Diagnosis not present

## 2021-05-02 DIAGNOSIS — I10 Essential (primary) hypertension: Secondary | ICD-10-CM | POA: Diagnosis not present

## 2021-05-02 DIAGNOSIS — I700 Atherosclerosis of aorta: Secondary | ICD-10-CM | POA: Diagnosis not present

## 2021-05-02 DIAGNOSIS — R5383 Other fatigue: Secondary | ICD-10-CM | POA: Diagnosis not present

## 2021-05-02 DIAGNOSIS — Z7901 Long term (current) use of anticoagulants: Secondary | ICD-10-CM | POA: Diagnosis not present

## 2021-05-02 DIAGNOSIS — I4891 Unspecified atrial fibrillation: Secondary | ICD-10-CM | POA: Diagnosis not present

## 2021-05-02 DIAGNOSIS — D649 Anemia, unspecified: Secondary | ICD-10-CM | POA: Diagnosis not present

## 2021-05-02 DIAGNOSIS — I129 Hypertensive chronic kidney disease with stage 1 through stage 4 chronic kidney disease, or unspecified chronic kidney disease: Secondary | ICD-10-CM | POA: Diagnosis not present

## 2021-05-06 ENCOUNTER — Other Ambulatory Visit: Payer: Self-pay

## 2021-05-06 ENCOUNTER — Ambulatory Visit (HOSPITAL_COMMUNITY): Payer: Medicare Other | Attending: Internal Medicine

## 2021-05-06 DIAGNOSIS — R0602 Shortness of breath: Secondary | ICD-10-CM | POA: Diagnosis not present

## 2021-05-06 LAB — ECHOCARDIOGRAM COMPLETE
Area-P 1/2: 3.7 cm2
P 1/2 time: 254 msec
S' Lateral: 2.8 cm

## 2021-06-03 DIAGNOSIS — I4891 Unspecified atrial fibrillation: Secondary | ICD-10-CM | POA: Diagnosis not present

## 2021-06-03 DIAGNOSIS — Z7901 Long term (current) use of anticoagulants: Secondary | ICD-10-CM | POA: Diagnosis not present

## 2021-06-03 DIAGNOSIS — I129 Hypertensive chronic kidney disease with stage 1 through stage 4 chronic kidney disease, or unspecified chronic kidney disease: Secondary | ICD-10-CM | POA: Diagnosis not present

## 2021-06-11 ENCOUNTER — Other Ambulatory Visit: Payer: Self-pay

## 2021-06-11 ENCOUNTER — Encounter (INDEPENDENT_AMBULATORY_CARE_PROVIDER_SITE_OTHER): Payer: Self-pay | Admitting: Ophthalmology

## 2021-06-11 ENCOUNTER — Ambulatory Visit (INDEPENDENT_AMBULATORY_CARE_PROVIDER_SITE_OTHER): Payer: Medicare Other | Admitting: Ophthalmology

## 2021-06-11 DIAGNOSIS — H353212 Exudative age-related macular degeneration, right eye, with inactive choroidal neovascularization: Secondary | ICD-10-CM | POA: Diagnosis not present

## 2021-06-11 DIAGNOSIS — H353221 Exudative age-related macular degeneration, left eye, with active choroidal neovascularization: Secondary | ICD-10-CM

## 2021-06-11 DIAGNOSIS — H353211 Exudative age-related macular degeneration, right eye, with active choroidal neovascularization: Secondary | ICD-10-CM

## 2021-06-11 MED ORDER — AFLIBERCEPT 2MG/0.05ML IZ SOLN FOR KALEIDOSCOPE
2.0000 mg | INTRAVITREAL | Status: AC | PRN
  Administered 2021-06-11: 2 mg via INTRAVITREAL

## 2021-06-11 NOTE — Assessment & Plan Note (Signed)
Major disciform scar OD, no real change over the last 8 weeks post Avastin.  We will continue to just simply monitor

## 2021-06-11 NOTE — Progress Notes (Signed)
06/11/2021     CHIEF COMPLAINT Patient presents for  Chief Complaint  Patient presents with   Macular Degeneration      HISTORY OF PRESENT ILLNESS: Sylvia [name] is a 88 y.o. female who presents to the clinic today for:   HPI   At 8-week follow-up today post Avastin OD and Eylea OS, no interval change in acuity per patient.   Last edited by Hurman Horn, MD on 06/11/2021  2:53 PM.      Referring physician: Donnajean Lopes, MD [place],  [place] [address]  HISTORICAL INFORMATION:   Selected notes from the MEDICAL RECORD NUMBER       CURRENT MEDICATIONS: Current Outpatient Medications (Ophthalmic Drugs)  Medication Sig   hydroxypropyl methylcellulose / hypromellose (ISOPTO TEARS / GONIOVISC) 2.5 % ophthalmic solution Place 1 drop into both eyes as needed for dry eyes.   No current facility-administered medications for this visit. (Ophthalmic Drugs)   Current Outpatient Medications (Other)  Medication Sig   acetaminophen (TYLENOL) 325 MG tablet Take by mouth every 6 (six) hours as needed for mild pain.   amLODipine (NORVASC) 2.5 MG tablet TAKE 2 TABLETS BY MOUTH IN THE MORNING AND 1 IN THE EVENING   calcium citrate-vitamin D (CITRACAL+D) 315-200 MG-UNIT per tablet Take 1 tablet by mouth daily.   diclofenac sodium (VOLTAREN) 1 % GEL Apply 4 g topically 4 (four) times daily.   fluocinonide (LIDEX) 0.05 % external solution 2 (two) times daily. 1-2 GTTS IN EARS TWICE WEEKLY AS NEEDED    levothyroxine (SYNTHROID, LEVOTHROID) 75 MCG tablet Take 75 mcg by mouth daily.     losartan (COZAAR) 100 MG tablet Take 100 mg by mouth daily.     metoprolol tartrate (LOPRESSOR) 50 MG tablet Take 50 mg by mouth 2 (two) times daily.   Multiple Vitamins-Minerals (OCUVITE PRESERVISION PO) Take 1 tablet by mouth 2 (two) times daily.   pantoprazole (PROTONIX) 40 MG tablet Take 40 mg by mouth 2 (two) times daily.   polyethylene glycol powder (GLYCOLAX/MIRALAX) powder Take  17 g by mouth daily. PRN     predniSONE (DELTASONE) 20 MG tablet    promethazine (PHENERGAN) 25 MG tablet Take 25 mg by mouth 3 (three) times daily as needed.   raloxifene (EVISTA) 60 MG tablet Take 60 mg by mouth daily.     sucralfate (CARAFATE) 1 GM/10ML suspension Take 10 mLs (1 g total) by mouth 4 (four) times daily -  with meals and at bedtime.   warfarin (COUMADIN) 5 MG tablet TAKE 1/2 TO 1 TABLET BY MOUTH ONCE DAILY AS DIRECTED BY COUMADIN CLINIC   No current facility-administered medications for this visit. (Other)      REVIEW OF SYSTEMS: ROS   Negative for: Constitutional, Gastrointestinal, Neurological, Skin, Genitourinary, Musculoskeletal, HENT, Endocrine, Cardiovascular, Eyes, Respiratory, Psychiatric, Allergic/Imm, Heme/Lymph Last edited by Hurman Horn, MD on 06/11/2021  2:53 PM.       ALLERGIES Allergies  Allergen Reactions   Metoclopramide Hcl     REACTION: nervous, climbs the wall   Penicillins     REACTION: hives   Pravastatin    Sulfonamide Derivatives     REACTION: rash    PAST MEDICAL HISTORY Past Medical History:  Diagnosis Date   Atrial fibrillation ([place])    ATRIAL FIBRILLATION S/P  PVI 6/11   CAD (coronary artery disease)    a. 03/2002: cath showing 20% OM1 stenosis, EF at 65%, no WMA. No significant CAD.  GERD (gastroesophageal reflux disease)    Hyperlipidemia    Hypertension    Macular degeneration    SVT (supraventricular tachycardia) (HCC)    Past Surgical History:  Procedure Laterality Date   ABLATION OF DYSRHYTHMIC FOCUS  09/2009   s/p PVI by JA   TOTAL ABDOMINAL HYSTERECTOMY      FAMILY HISTORY Family History  Problem Relation Age of Onset   Stroke Other    Coronary artery disease Other     SOCIAL HISTORY Social History   Tobacco Use   Smoking status: Never   Smokeless tobacco: Never  Vaping Use   Vaping Use: Never used  Substance Use Topics   Alcohol use: No   Drug use: No         OPHTHALMIC EXAM:  Base  Eye Exam     Visual Acuity (ETDRS)       Right Left   Dist cc CF at 3' 20/70   Dist ph cc  NI         Tonometry (Tonopen, 2:26 PM)       Right Left   Pressure 20 17         Pupils       Pupils APD   Right PERRL None   Left PERRL None         Visual Fields       Left Right   Restrictions  Partial inner superior temporal, inferior temporal, superior nasal, inferior nasal deficiencies         Neuro/Psych     Oriented x3: Yes   Mood/Affect: Normal         Dilation     Both eyes: 1.0% Mydriacyl, 2.5% Phenylephrine @ 2:25 PM           Slit Lamp and Fundus Exam     External Exam       Right Left   External Normal Normal         Slit Lamp Exam       Right Left   Lids/Lashes Normal Normal   Conjunctiva/Sclera White and quiet White and quiet   Cornea Clear Clear   Anterior Chamber Deep and quiet Deep and quiet   Iris Round and reactive Round and reactive   Lens Posterior chamber intraocular lens Posterior chamber intraocular lens   Anterior Vitreous Normal Normal         Fundus Exam       Right Left   Posterior Vitreous Posterior vitreous detachment Posterior vitreous detachment   Disc Normal Normal   C/D Ratio 0.1 0.1   Macula Exudates, Macular thickening, Subretinal hemorrhage Age related macular degeneration, Atrophy into faz, advanced age related macular degeneration, Retinal pigment epithelial atrophy, Geographic atrophy in the FAZ,    Vessels Normal Normal   Periphery Normal Normal            IMAGING AND PROCEDURES  Imaging and Procedures for 06/11/21  OCT, Retina - OU - Both Eyes       Right Eye Quality was good. Scan locations included subfoveal. Central Foveal Thickness: 805. Progression has worsened. Findings include abnormal foveal contour, retinal drusen , cystoid macular edema, intraretinal fluid, disciform scar.   Left Eye Quality was good. Scan locations included subfoveal. Central Foveal Thickness: 246.  Progression has been stable. Findings include abnormal foveal contour, intraretinal hyper-reflective material, no SRF, no IRF.   Notes At 8-week follow-up today, increased CME increased fluid and increased thickness left eye from reactivation of CNVM.  Will need repeat Eylea today and examination again in 8 -week, much improved OS at 8 weeks post Eylea injection  OD with chronic subfoveal disciform scar, chronic CME intraretinal fluid, now with retinal hemorrhage enlargement.  Today at 8 weeks postinjection with no real change in the large chronic disciform scarring and intraretinal fluid. Stable     Intravitreal Injection, Pharmacologic Agent - OS - Left Eye       Time Out 06/11/2021. 2:27 PM. Confirmed correct patient, procedure, site, and patient consented.   Anesthesia Topical anesthesia was used. Anesthetic medications included Lidocaine 4%.   Procedure Preparation included Tobramycin 0.3%, Ofloxacin , 10% betadine to eyelids, 5% betadine to ocular surface. A 30 gauge needle was used.   Injection: 2 mg aflibercept 2 MG/0.05ML   Route: Intravitreal, Site: Left Eye   ND[place]: [id number], Lot: [id number], Waste: 0 mL   Post-op Post injection exam found visual acuity of at least counting fingers. The patient tolerated the procedure well. There were no complications. The patient received written and verbal post procedure care education. Post injection medications included ocuflox.   Notes OS vastly improved macular findings.  Stable macular findings at 6 weeks postinjection.  We will repeat injection today and extend interval OS next 8 weeks             ASSESSMENT/PLAN:  Exudative age-related macular degeneration of right eye with inactive choroidal neovascularization (HCC) Major disciform scar OD, no real change over the last 8 weeks post Avastin.  We will continue to just simply monitor  Exudative age-related macular degeneration of left eye with active choroidal  neovascularization (HCC) OS much improved anatomy at 8-week interval.  Repeat Eylea today and evaluate next in 9-week     ICD-10-CM   1. Exudative age-related macular degeneration of right eye with active choroidal neovascularization (HCC)  H35.3211 OCT, Retina - OU - Both Eyes    CANCELED: Intravitreal Injection, Pharmacologic Agent - OD - Right Eye    2. Exudative age-related macular degeneration of left eye with active choroidal neovascularization (HCC)  H35.3221 OCT, Retina - OU - Both Eyes    Intravitreal Injection, Pharmacologic Agent - OS - Left Eye    aflibercept (EYLEA) SOLN 2 mg    3. Exudative age-related macular degeneration of right eye with inactive choroidal neovascularization ([place])  H35.3212       1.  OD no interval change over 8 weeks.  Chronic disciform scar no impact from an eye vegF we will simply observe  2.  OS, vastly improved yet still with perifoveal macular atrophy ringing in the FAZ, much improved anatomy on Eylea.  Repeat injection today at a current 8-week interval next evaluation in 9 weeks  3.  Ophthalmic Meds Ordered this visit:  Meds ordered this encounter  Medications   aflibercept (EYLEA) SOLN 2 mg       Return in about 9 weeks (around 08/13/2021) for dilate, OS, EYLEA OCT.  There are no Patient Instructions on file for this visit.   Explained the diagnoses, plan, and follow up with the patient and they expressed understanding.  Patient expressed understanding of the importance of proper follow up care.   Clent Demark Breccan Galant M.D. Diseases & Surgery of the Retina and Vitreous [facility] 06/11/21     Abbreviations: M myopia (nearsighted); A astigmatism; H hyperopia (farsighted); P presbyopia; Mrx spectacle prescription;  CTL contact lenses; OD right eye; OS left eye; OU both eyes  XT exotropia; ET esotropia; PEK punctate  epithelial keratitis; PEE punctate epithelial erosions; DES dry eye syndrome; MGD meibomian gland  dysfunction; ATs artificial tears; PFAT's preservative free artificial tears; [place] nuclear sclerotic cataract; PSC posterior subcapsular cataract; ERM epi-retinal membrane; PVD posterior vitreous detachment; RD retinal detachment; DM diabetes mellitus; DR diabetic retinopathy; NPDR non-proliferative diabetic retinopathy; PDR proliferative diabetic retinopathy; CSME clinically significant macular edema; DME diabetic macular edema; dbh dot blot hemorrhages; CWS cotton wool spot; POAG primary open angle glaucoma; C/D cup-to-disc ratio; HVF humphrey visual field; GVF goldmann visual field; OCT optical coherence tomography; IOP intraocular pressure; BRVO Branch retinal vein occlusion; CRVO central retinal vein occlusion; CRAO central retinal artery occlusion; BRAO branch retinal artery occlusion; RT retinal tear; SB scleral buckle; PPV pars plana vitrectomy; VH Vitreous hemorrhage; PRP panretinal laser photocoagulation; IVK intravitreal kenalog; VMT vitreomacular traction; MH Macular hole;  NVD neovascularization of the disc; NVE neovascularization elsewhere; AREDS age related eye disease study; ARMD age related macular degeneration; POAG primary open angle glaucoma; EBMD epithelial/anterior basement membrane dystrophy; ACIOL anterior chamber intraocular lens; IOL intraocular lens; PCIOL posterior chamber intraocular lens; Phaco/IOL phacoemulsification with intraocular lens placement; [place] photorefractive keratectomy; LASIK laser assisted in situ keratomileusis; HTN hypertension; DM diabetes mellitus; COPD chronic obstructive pulmonary disease

## 2021-06-11 NOTE — Assessment & Plan Note (Signed)
OS much improved anatomy at 8-week interval.  Repeat Eylea today and evaluate next in 9-week

## 2021-07-04 DIAGNOSIS — I4891 Unspecified atrial fibrillation: Secondary | ICD-10-CM | POA: Diagnosis not present

## 2021-07-04 DIAGNOSIS — Z7901 Long term (current) use of anticoagulants: Secondary | ICD-10-CM | POA: Diagnosis not present

## 2021-07-04 DIAGNOSIS — I10 Essential (primary) hypertension: Secondary | ICD-10-CM | POA: Diagnosis not present

## 2021-07-04 DIAGNOSIS — K8020 Calculus of gallbladder without cholecystitis without obstruction: Secondary | ICD-10-CM | POA: Diagnosis not present

## 2021-07-04 DIAGNOSIS — I129 Hypertensive chronic kidney disease with stage 1 through stage 4 chronic kidney disease, or unspecified chronic kidney disease: Secondary | ICD-10-CM | POA: Diagnosis not present

## 2021-07-04 DIAGNOSIS — R109 Unspecified abdominal pain: Secondary | ICD-10-CM | POA: Diagnosis not present

## 2021-07-04 DIAGNOSIS — N390 Urinary tract infection, site not specified: Secondary | ICD-10-CM | POA: Diagnosis not present

## 2021-07-04 DIAGNOSIS — R0602 Shortness of breath: Secondary | ICD-10-CM | POA: Diagnosis not present

## 2021-07-04 DIAGNOSIS — E785 Hyperlipidemia, unspecified: Secondary | ICD-10-CM | POA: Diagnosis not present

## 2021-07-10 DIAGNOSIS — E785 Hyperlipidemia, unspecified: Secondary | ICD-10-CM | POA: Diagnosis not present

## 2021-07-10 DIAGNOSIS — I10 Essential (primary) hypertension: Secondary | ICD-10-CM | POA: Diagnosis not present

## 2021-07-10 DIAGNOSIS — N390 Urinary tract infection, site not specified: Secondary | ICD-10-CM | POA: Diagnosis not present

## 2021-08-05 DIAGNOSIS — I4891 Unspecified atrial fibrillation: Secondary | ICD-10-CM | POA: Diagnosis not present

## 2021-08-05 DIAGNOSIS — Z7901 Long term (current) use of anticoagulants: Secondary | ICD-10-CM | POA: Diagnosis not present

## 2021-08-05 DIAGNOSIS — R600 Localized edema: Secondary | ICD-10-CM | POA: Diagnosis not present

## 2021-08-13 ENCOUNTER — Encounter (INDEPENDENT_AMBULATORY_CARE_PROVIDER_SITE_OTHER): Payer: Self-pay | Admitting: Ophthalmology

## 2021-08-13 ENCOUNTER — Ambulatory Visit (INDEPENDENT_AMBULATORY_CARE_PROVIDER_SITE_OTHER): Payer: Medicare Other | Admitting: Ophthalmology

## 2021-08-13 DIAGNOSIS — H353212 Exudative age-related macular degeneration, right eye, with inactive choroidal neovascularization: Secondary | ICD-10-CM

## 2021-08-13 DIAGNOSIS — H353134 Nonexudative age-related macular degeneration, bilateral, advanced atrophic with subfoveal involvement: Secondary | ICD-10-CM

## 2021-08-13 DIAGNOSIS — H353221 Exudative age-related macular degeneration, left eye, with active choroidal neovascularization: Secondary | ICD-10-CM

## 2021-08-13 DIAGNOSIS — H43812 Vitreous degeneration, left eye: Secondary | ICD-10-CM | POA: Diagnosis not present

## 2021-08-13 DIAGNOSIS — Z20822 Contact with and (suspected) exposure to covid-19: Secondary | ICD-10-CM | POA: Diagnosis not present

## 2021-08-13 MED ORDER — AFLIBERCEPT 2MG/0.05ML IZ SOLN FOR KALEIDOSCOPE
2.0000 mg | INTRAVITREAL | Status: AC | PRN
  Administered 2021-08-13: 2 mg via INTRAVITREAL

## 2021-08-13 NOTE — Assessment & Plan Note (Signed)
Massive old center involved disciform scar. ?

## 2021-08-13 NOTE — Progress Notes (Signed)
? ? ?08/13/2021 ? ?  ? ?CHIEF COMPLAINT ?Patient presents for  ?Chief Complaint  ?Patient presents with  ? Macular Degeneration  ? ? ? ? ?HISTORY OF PRESENT ILLNESS: ?Sylvia [name] is a 88 y.o. female who presents to the clinic today for:  ? ?HPI   ?9 weeks dilate OS, Eylea OS, OCT. ?Patient states vision is stable and unchanged since last visit. Denies any new floaters or FOL. ? ?Last edited by Laurin Coder on 08/13/2021  1:32 PM.  ?  ? ? ?Referring physician: ?Donnajean Lopes, MD ?[address] ?[place],  [place] [address] ? ?HISTORICAL INFORMATION:  ? ?Selected notes from the [facility] ?  ?   ? ?CURRENT MEDICATIONS: ?Current Outpatient Medications (Ophthalmic Drugs)  ?Medication Sig  ? hydroxypropyl methylcellulose / hypromellose (ISOPTO TEARS / GONIOVISC) 2.5 % ophthalmic solution Place 1 drop into both eyes as needed for dry eyes.  ? ?No current facility-administered medications for this visit. (Ophthalmic Drugs)  ? ?Current Outpatient Medications (Other)  ?Medication Sig  ? acetaminophen (TYLENOL) 325 MG tablet Take by mouth every 6 (six) hours as needed for mild pain.  ? amLODipine (NORVASC) 2.5 MG tablet TAKE 2 TABLETS BY MOUTH IN THE MORNING AND 1 IN THE EVENING  ? calcium citrate-vitamin D (CITRACAL+D) 315-200 MG-UNIT per tablet Take 1 tablet by mouth daily.  ? diclofenac sodium (VOLTAREN) 1 % GEL Apply 4 g topically 4 (four) times daily.  ? fluocinonide (LIDEX) 0.05 % external solution 2 (two) times daily. 1-2 GTTS IN EARS TWICE WEEKLY AS NEEDED   ? levothyroxine (SYNTHROID, LEVOTHROID) 75 MCG tablet Take 75 mcg by mouth daily.    ? losartan (COZAAR) 100 MG tablet Take 100 mg by mouth daily.    ? metoprolol tartrate (LOPRESSOR) 50 MG tablet Take 50 mg by mouth 2 (two) times daily.  ? Multiple Vitamins-Minerals (OCUVITE PRESERVISION PO) Take 1 tablet by mouth 2 (two) times daily.  ? pantoprazole (PROTONIX) 40 MG tablet Take 40 mg by mouth 2 (two) times daily.  ? polyethylene glycol powder  (GLYCOLAX/MIRALAX) powder Take 17 g by mouth daily. PRN ?   ? predniSONE (DELTASONE) 20 MG tablet   ? promethazine (PHENERGAN) 25 MG tablet Take 25 mg by mouth 3 (three) times daily as needed.  ? raloxifene (EVISTA) 60 MG tablet Take 60 mg by mouth daily.    ? sucralfate (CARAFATE) 1 GM/10ML suspension Take 10 mLs (1 g total) by mouth 4 (four) times daily -  with meals and at bedtime.  ? warfarin (COUMADIN) 5 MG tablet TAKE 1/2 TO 1 TABLET BY MOUTH ONCE DAILY AS DIRECTED BY COUMADIN CLINIC  ? ?No current facility-administered medications for this visit. (Other)  ? ? ? ? ?REVIEW OF SYSTEMS: ? ? ? ?ALLERGIES ?Allergies  ?Allergen Reactions  ? Metoclopramide Hcl   ?  REACTION: nervous, climbs the wall  ? Penicillins   ?  REACTION: hives  ? Pravastatin   ? Sulfonamide Derivatives   ?  REACTION: rash  ? ? ?PAST MEDICAL HISTORY ?Past Medical History:  ?Diagnosis Date  ? Atrial fibrillation ([place])   ? ATRIAL FIBRILLATION S/P  PVI 6/11  ? CAD (coronary artery disease)   ? a. 03/2002: cath showing 20% OM1 stenosis, EF at 65%, no WMA. No significant CAD.   ? GERD (gastroesophageal reflux disease)   ? Hyperlipidemia   ? Hypertension   ? Macular degeneration   ? SVT (supraventricular tachycardia) ([place])   ? ?Past Surgical History:  ?  Procedure Laterality Date  ? ABLATION OF DYSRHYTHMIC FOCUS  09/2009  ? s/p PVI by JA  ? TOTAL ABDOMINAL HYSTERECTOMY    ? ? ?FAMILY HISTORY ?Family History  ?Problem Relation Age of Onset  ? Stroke Other   ? Coronary artery disease Other   ? ? ?SOCIAL HISTORY ?Social History  ? ?Tobacco Use  ? Smoking status: Never  ? Smokeless tobacco: Never  ?Vaping Use  ? Vaping Use: Never used  ?Substance Use Topics  ? Alcohol use: No  ? Drug use: No  ? ?  ? ?  ? ?OPHTHALMIC EXAM: ? ?Base Eye Exam   ? ? Visual Acuity (ETDRS)   ? ?   Right Left  ? Dist [place] CF at 1' 20/50 -2  ? Dist cc CF at 3' 20/60  ? Dist ph cc NI 20/50 -2  ? ? Correction: Glasses  ? ?  ?  ? ? Tonometry (Tonopen, 1:41 PM)   ? ?   Right Left  ?  Pressure 5 7  ? ?  ?  ? ? Pupils   ? ?   Pupils Dark Light APD  ? Right PERRL 2 2 None  ? Left PERRL 2 2 None  ? ?  ?  ? ? Extraocular Movement   ? ?   Right Left  ?  Full Full  ? ?  ?  ? ? Neuro/Psych   ? ? Oriented x3: Yes  ? Mood/Affect: Normal  ? ?  ?  ? ? Dilation   ? ? Left eye: 1.0% Mydriacyl, 2.5% Phenylephrine @ 1:41 PM  ? ?  ?  ? ?  ? ?Slit Lamp and Fundus Exam   ? ? External Exam   ? ?   Right Left  ? External Normal Normal  ? ?  ?  ? ? Slit Lamp Exam   ? ?   Right Left  ? Lids/Lashes Normal Normal  ? Conjunctiva/Sclera White and quiet White and quiet  ? Cornea Clear Clear  ? Anterior Chamber Deep and quiet Deep and quiet  ? Iris Round and reactive Round and reactive  ? Lens Posterior chamber intraocular lens Posterior chamber intraocular lens  ? Anterior Vitreous Normal Normal  ? ?  ?  ? ? Fundus Exam   ? ?   Right Left  ? Posterior Vitreous  Posterior vitreous detachment  ? Disc  Normal  ? C/D Ratio  0.1  ? Macula  Age related macular degeneration, Atrophy into faz, advanced age related macular degeneration, Retinal pigment epithelial atrophy, Geographic atrophy in the FAZ,   ? Vessels  Normal  ? Periphery  Normal  ? ?  ?  ? ?  ? ? ?IMAGING AND PROCEDURES  ?Imaging and Procedures for 08/13/21 ? ?OCT, Retina - OU - Both Eyes   ? ?   ?Right Eye ?Quality was good. Scan locations included subfoveal. Central Foveal Thickness: 656. Progression has worsened. Findings include abnormal foveal contour, retinal drusen , cystoid macular edema, intraretinal fluid, disciform scar.  ? ?Left Eye ?Quality was good. Scan locations included subfoveal. Central Foveal Thickness: 357. Progression has been stable. Findings include abnormal foveal contour, intraretinal hyper-reflective material, no SRF, no IRF.  ? 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  Will need repeat Eylea today and examination again in 6-week, much improved OS at 8 weeks  post Eylea injection ? ?OD with chronic subfoveal disciform  scar, chronic CME intraretinal fluid, now with retinal hemorrhage enlargement.  Today at 3 months postinjection with no real change in the large chronic disciform scarring and intraretinal fluid. ?Stable ? ?  ? ?Intravitreal Injection, Pharmacologic Agent - OS - Left Eye   ? ?   ?Time Out ?08/13/2021. 2:27 PM. Confirmed correct patient, procedure, site, and patient consented.  ? ?Anesthesia ?Topical anesthesia was used. Anesthetic medications included Lidocaine 4%.  ? ?Procedure ?Preparation included Tobramycin 0.3%, Ofloxacin , 10% betadine to eyelids, 5% betadine to ocular surface. A 30 gauge needle was used.  ? ?Injection: ?2 mg aflibercept 2 MG/0.05ML ?  Route: Intravitreal, Site: Left Eye ?  [place]: [id number], Lot: [id number], Waste: 0 mL  ? ?Post-op ?Post injection exam found visual acuity of at least counting fingers. The patient tolerated the procedure well. There were no complications. The patient received written and verbal post procedure care education. Post injection medications included ocuflox.  ? ?Notes ?OS vastly improved macular findings.  Stable macular findings at 6 weeks postinjection.  We will repeat injection today and extend interval OS next 8 weeks ? ?  ? ? ?  ?  ? ?  ?ASSESSMENT/PLAN: ? ?Exudative age-related macular degeneration of left eye with active choroidal neovascularization ([place]) ?Today at 9-week interval follow-up, sooner involved intraretinal fluid CME OS with preserved acuity.  We will need to repeat injection today to maintain and prevent CME  from CNVM from progressing ? ?Advanced nonexudative age-related macular degeneration of both eyes with subfoveal involvement ?OS with preserved acuity may be also be a candidate soon for injection of medication to prevent progression of geographic atrophy  ? ?  ICD-10-CM   ?1. Exudative age-related macular degeneration of left eye with active choroidal neovascularization (HCC)   H35.3221 OCT, Retina - OU - Both Eyes  ?  Intravitreal Injection, Pharmacologic Agent - OS - Left Eye  ?  aflibercept (EYLEA) SOLN 2 mg  ?  ?2. Advanced nonexudative age-related macular degeneration of both eyes w

## 2021-08-13 NOTE — Assessment & Plan Note (Signed)
No holes or tears 

## 2021-08-13 NOTE — Assessment & Plan Note (Signed)
OS with preserved acuity may be also be a candidate soon for injection of medication to prevent progression of geographic atrophy ?

## 2021-08-13 NOTE — Assessment & Plan Note (Signed)
Today at 9-week interval follow-up, sooner involved intraretinal fluid CME OS with preserved acuity.  We will need to repeat injection today to maintain and prevent CME  from CNVM from progressing ?

## 2021-08-14 NOTE — Progress Notes (Signed)
?Cardiology Office Note:   ? ?Date:  08/15/2021  ? ?ID:  Sylvia [name], DOB 11/07/1932, MRN [id number] ? ?PCP:  Donnajean Lopes, MD ?[facility] Cardiologist: Kirk Ruths, MD  ? ?Reason for visit: Swelling ? ?History of Present Illness:   ? ?Sylvia [name] is a 88 y.o. female with a hx of atrial fibrillation status post A-fib ablation in 2011 (with recurrent A-fib, now with rate control strategy), SVT, nonobstructive CAD by [facility] 2003.   ? ?She was last seen by Dr. Stanford Breed in January 2023.  Patient mentions fatigue and some dyspnea on exertion since having COVID 2 years prior.  Otherwise no chest pain.  2D echo ordered January 2023 and showed EF 65 to 70%, normal RV, severely dilated left atria, mild mitral regurgitation and aortic regurgitation. ? ?Today, she states nurse noticed leg swelling during her Coumadin clinic appointment and recommended she see her cardiologist.  Patient states she has had swelling for at least a few months, worse later in the day.  She states she does not eat too much salt.  She has compression stockings at home but does not wear them.  She has chronic dyspnea on exertion and fatigue, unchanged.  She denies PND, orthopnea and palpitations.  Her weight has been stable.  She takes Eliquis with no bleeding issues. ? ?  ?Past Medical History:  ?Diagnosis Date  ? Atrial fibrillation ([place])   ? ATRIAL FIBRILLATION S/P  PVI 6/11  ? CAD (coronary artery disease)   ? a. 03/2002: cath showing 20% OM1 stenosis, EF at 65%, no WMA. No significant CAD.   ? GERD (gastroesophageal reflux disease)   ? Hyperlipidemia   ? Hypertension   ? Macular degeneration   ? SVT (supraventricular tachycardia) ([place])   ? ? ?Past Surgical History:  ?Procedure Laterality Date  ? ABLATION OF DYSRHYTHMIC FOCUS  09/2009  ? s/p PVI by JA  ? TOTAL ABDOMINAL HYSTERECTOMY    ? ? ?Current Medications: ?Current Meds  ?Medication Sig  ? acetaminophen (TYLENOL) 325 MG tablet Take by mouth every 6 (six) hours as  needed for mild pain.  ? amLODipine (NORVASC) 2.5 MG tablet TAKE 2 TABLETS BY MOUTH IN THE MORNING AND 1 IN THE EVENING  ? calcium citrate-vitamin D (CITRACAL+D) 315-200 MG-UNIT per tablet Take 1 tablet by mouth daily.  ? diclofenac sodium (VOLTAREN) 1 % GEL Apply 4 g topically 4 (four) times daily.  ? fluocinonide (LIDEX) 0.05 % external solution 2 (two) times daily. 1-2 GTTS IN EARS TWICE WEEKLY AS NEEDED   ? hydroxypropyl methylcellulose / hypromellose (ISOPTO TEARS / GONIOVISC) 2.5 % ophthalmic solution Place 1 drop into both eyes as needed for dry eyes.  ? levothyroxine (SYNTHROID, LEVOTHROID) 75 MCG tablet Take 75 mcg by mouth daily.    ? losartan (COZAAR) 100 MG tablet Take 100 mg by mouth daily.    ? metoprolol tartrate (LOPRESSOR) 50 MG tablet Take 50 mg by mouth 2 (two) times daily.  ? Multiple Vitamins-Minerals (OCUVITE PRESERVISION PO) Take 1 tablet by mouth 2 (two) times daily.  ? pantoprazole (PROTONIX) 40 MG tablet Take 40 mg by mouth 2 (two) times daily.  ? polyethylene glycol powder (GLYCOLAX/MIRALAX) powder Take 17 g by mouth daily. PRN ?   ? predniSONE (DELTASONE) 20 MG tablet   ? promethazine (PHENERGAN) 25 MG tablet Take 25 mg by mouth 3 (three) times daily as needed.  ? raloxifene (EVISTA) 60 MG tablet Take 60 mg by mouth daily.    ?  sucralfate (CARAFATE) 1 GM/10ML suspension Take 10 mLs (1 g total) by mouth 4 (four) times daily -  with meals and at bedtime.  ? warfarin (COUMADIN) 5 MG tablet TAKE 1/2 TO 1 TABLET BY MOUTH ONCE DAILY AS DIRECTED BY COUMADIN CLINIC  ?  ? ?Allergies:   Metoclopramide hcl, Penicillins, Pravastatin, and Sulfonamide derivatives  ? ?Social History  ? ?Socioeconomic History  ? Marital status: Married  ?  Spouse name: Not on file  ? Number of children: Not on file  ? Years of education: Not on file  ? Highest education level: Not on file  ?Occupational History  ? Not on file  ?Tobacco Use  ? Smoking status: Never  ? Smokeless tobacco: Never  ?Vaping Use  ? Vaping Use:  Never used  ?Substance and Sexual Activity  ? Alcohol use: No  ? Drug use: No  ? Sexual activity: Not on file  ?Other Topics Concern  ? Not on file  ?Social History Narrative  ? Lives with spouse  ? ?Social Determinants of Health  ? ?Financial Resource Strain: Not on file  ?Food Insecurity: Not on file  ?Transportation Needs: Not on file  ?Physical Activity: Not on file  ?Stress: Not on file  ?Social Connections: Not on file  ?  ? ?Family History: ?The patient's family history includes Coronary artery disease in an other family member; Stroke in an other family member. ? ?ROS:   ?Please see the history of present illness.    ? ?EKGs/Labs/Other Studies Reviewed:   ? ?Recent Labs: ?No results found for requested labs within last 8760 hours.  ? ?Recent Lipid Panel ?No results found for: CHOL, TRIG, HDL, LDLCALC, LDLDIRECT ? ?Physical Exam:   ? ?VS:  BP 120/60 (BP Location: Left Arm)   Pulse 73   Ht 5' 1.5" (1.562 m)   Wt 148 lb (67.1 kg)   SpO2 98%   BMI 27.51 kg/m?    ?No data found. ? ?Wt Readings from Last 3 Encounters:  ?08/15/21 148 lb (67.1 kg)  ?04/23/21 148 lb 9.6 oz (67.4 kg)  ?12/20/20 150 lb (68 kg)  ?  ? ?GEN:  Well nourished, well developed in no acute distress, elderly ?HEENT: Normal ?NECK: No JVD; No carotid bruits ?CARDIAC: Irregular irregular ?RESPIRATORY:  Clear to auscultation without rales, wheezing or rhonchi  ?ABDOMEN: Soft, non-tender, non-distended ?MUSCULOSKELETAL: 1+ edema to bilateral shins, significant varicose veins and spider veins bilaterally ?SKIN: Warm and dry ?NEUROLOGIC:  Alert and oriented ?PSYCHIATRIC:  Normal affect  ?  ?ASSESSMENT AND PLAN  ? ?Lower extremity edema ?-Secondary to venous insufficiency -significant varicose veins and spider veins on exam ?-Recommend salt restriction, leg elevation, frequent ambulation.  Compression stockings as tolerated. ?-Do not think she requires diuretics with no other heart failure symptoms/ able to get shoes on. ? ?Permanent atrial  fibrillation ?-Status post A-fib ablation 2011 ?-Continue Coumadin with goal INR 2-3; previously declined novel agents ?-Continue metoprolol for rate control. ? ?Hypertension, well controlled ?-Continue current medications. ? ?Disposition - Follow-up in 1 year as scheduled with Dr. Stanford Breed. ? ? ?Medication Adjustments/Labs and Tests Ordered: ?Current medicines are reviewed at length with the patient today.  Concerns regarding medicines are outlined above.  ?No orders of the defined types were placed in this encounter. ? ?No orders of the defined types were placed in this encounter. ? ? ?There are no Patient Instructions on file for this visit.  ? ?Signed, ?Warren Lacy, PA-C  ?08/15/2021 10:40 AM    ?  [place] ?

## 2021-08-15 ENCOUNTER — Ambulatory Visit (INDEPENDENT_AMBULATORY_CARE_PROVIDER_SITE_OTHER): Payer: Medicare Other | Admitting: Physician Assistant

## 2021-08-15 ENCOUNTER — Encounter: Payer: Self-pay | Admitting: Physician Assistant

## 2021-08-15 VITALS — BP 120/60 | HR 73 | Ht 61.5 in | Wt 148.0 lb

## 2021-08-15 DIAGNOSIS — Z20822 Contact with and (suspected) exposure to covid-19: Secondary | ICD-10-CM | POA: Diagnosis not present

## 2021-08-15 DIAGNOSIS — I4821 Permanent atrial fibrillation: Secondary | ICD-10-CM

## 2021-08-15 DIAGNOSIS — I10 Essential (primary) hypertension: Secondary | ICD-10-CM | POA: Diagnosis not present

## 2021-08-15 DIAGNOSIS — R600 Localized edema: Secondary | ICD-10-CM

## 2021-08-15 NOTE — Patient Instructions (Signed)
Medication Instructions:  ?No Changes ?*If you need a refill on your cardiac medications before your next appointment, please call your pharmacy* ? ? ?Lab Work: ?No Labs ?If you have labs (blood work) drawn today and your tests are completely normal, you will receive your results only by: ?MyChart Message (if you have MyChart) OR ?A paper copy in the mail ?If you have any lab test that is abnormal or we need to change your treatment, we will call you to review the results. ? ? ?Testing/Procedures: ?No Testing ? ? ?Follow-Up: ?At [facility], you and your health needs are our priority.  As part of our continuing mission to provide you with exceptional heart care, we have created designated Provider Care Teams.  These Care Teams include your primary Cardiologist (physician) and Advanced Practice Providers (APPs -  Physician Assistants and Nurse Practitioners) who all work together to provide you with the care you need, when you need it. ? ?We recommend signing up for the patient portal called "MyChart".  Sign up information is provided on this After Visit Summary.  MyChart is used to connect with patients for Virtual Visits (Telemedicine).  Patients are able to view lab/test results, encounter notes, upcoming appointments, etc.  Non-urgent messages can be sent to your provider as well.   ?To learn more about what you can do with MyChart, go to [email or web address].   ? ?Your next appointment:   ?January 2024 ? ?The format for your next appointment:   ?In Person ? ?Provider:   ?Kirk Ruths, MD   ? ? ?Other Instructions ?Recommend elevating legs. Wearing compression stockings as tolerated and walk frequently. ? ?Important Information About Sugar ? ? ? ? ?  ?

## 2021-08-29 DIAGNOSIS — N390 Urinary tract infection, site not specified: Secondary | ICD-10-CM | POA: Diagnosis not present

## 2021-09-06 DIAGNOSIS — I4891 Unspecified atrial fibrillation: Secondary | ICD-10-CM | POA: Diagnosis not present

## 2021-09-06 DIAGNOSIS — R600 Localized edema: Secondary | ICD-10-CM | POA: Diagnosis not present

## 2021-09-06 DIAGNOSIS — K5909 Other constipation: Secondary | ICD-10-CM | POA: Diagnosis not present

## 2021-09-06 DIAGNOSIS — Z7901 Long term (current) use of anticoagulants: Secondary | ICD-10-CM | POA: Diagnosis not present

## 2021-09-06 DIAGNOSIS — M8580 Other specified disorders of bone density and structure, unspecified site: Secondary | ICD-10-CM | POA: Diagnosis not present

## 2021-09-12 DIAGNOSIS — H6123 Impacted cerumen, bilateral: Secondary | ICD-10-CM | POA: Diagnosis not present

## 2021-09-24 ENCOUNTER — Ambulatory Visit (INDEPENDENT_AMBULATORY_CARE_PROVIDER_SITE_OTHER): Payer: Medicare Other | Admitting: Ophthalmology

## 2021-09-24 DIAGNOSIS — H353212 Exudative age-related macular degeneration, right eye, with inactive choroidal neovascularization: Secondary | ICD-10-CM | POA: Diagnosis not present

## 2021-09-24 DIAGNOSIS — H353221 Exudative age-related macular degeneration, left eye, with active choroidal neovascularization: Secondary | ICD-10-CM

## 2021-09-24 DIAGNOSIS — H353134 Nonexudative age-related macular degeneration, bilateral, advanced atrophic with subfoveal involvement: Secondary | ICD-10-CM

## 2021-09-24 MED ORDER — AFLIBERCEPT 2MG/0.05ML IZ SOLN FOR KALEIDOSCOPE
2.0000 mg | INTRAVITREAL | Status: AC | PRN
  Administered 2021-09-24: 2 mg via INTRAVITREAL

## 2021-09-24 NOTE — Progress Notes (Signed)
09/24/2021     CHIEF COMPLAINT Patient presents for  Chief Complaint  Patient presents with   Macular Degeneration      HISTORY OF PRESENT ILLNESS: LUNNA VOGELGESANG is a 88 y.o. female who presents to the clinic today for:   HPI   6 WEEKS for DILATE OU, EYLEA, OCT, OS. Pt stated vision has been stable since last visit. Pt denies new floaters and FOL.  Last edited by Silvestre Moment on 09/24/2021  1:14 PM.      Referring physician: Donnajean Lopes, MD [place],  [place] [address]  HISTORICAL INFORMATION:   Selected notes from the MEDICAL RECORD NUMBER       CURRENT MEDICATIONS: Current Outpatient Medications (Ophthalmic Drugs)  Medication Sig   hydroxypropyl methylcellulose / hypromellose (ISOPTO TEARS / GONIOVISC) 2.5 % ophthalmic solution Place 1 drop into both eyes as needed for dry eyes.   No current facility-administered medications for this visit. (Ophthalmic Drugs)   Current Outpatient Medications (Other)  Medication Sig   acetaminophen (TYLENOL) 325 MG tablet Take by mouth every 6 (six) hours as needed for mild pain.   amLODipine (NORVASC) 2.5 MG tablet TAKE 2 TABLETS BY MOUTH IN THE MORNING AND 1 IN THE EVENING   calcium citrate-vitamin D (CITRACAL+D) 315-200 MG-UNIT per tablet Take 1 tablet by mouth daily.   diclofenac sodium (VOLTAREN) 1 % GEL Apply 4 g topically 4 (four) times daily.   fluocinonide (LIDEX) 0.05 % external solution 2 (two) times daily. 1-2 GTTS IN EARS TWICE WEEKLY AS NEEDED    levothyroxine (SYNTHROID, LEVOTHROID) 75 MCG tablet Take 75 mcg by mouth daily.     losartan (COZAAR) 100 MG tablet Take 100 mg by mouth daily.     metoprolol tartrate (LOPRESSOR) 50 MG tablet Take 50 mg by mouth 2 (two) times daily.   Multiple Vitamins-Minerals (OCUVITE PRESERVISION PO) Take 1 tablet by mouth 2 (two) times daily.   pantoprazole (PROTONIX) 40 MG tablet Take 40 mg by mouth 2 (two) times daily.   polyethylene glycol powder (GLYCOLAX/MIRALAX)  powder Take 17 g by mouth daily. PRN     predniSONE (DELTASONE) 20 MG tablet    promethazine (PHENERGAN) 25 MG tablet Take 25 mg by mouth 3 (three) times daily as needed.   raloxifene (EVISTA) 60 MG tablet Take 60 mg by mouth daily.     sucralfate (CARAFATE) 1 GM/10ML suspension Take 10 mLs (1 g total) by mouth 4 (four) times daily -  with meals and at bedtime.   warfarin (COUMADIN) 5 MG tablet TAKE 1/2 TO 1 TABLET BY MOUTH ONCE DAILY AS DIRECTED BY COUMADIN CLINIC   No current facility-administered medications for this visit. (Other)      REVIEW OF SYSTEMS: ROS   Negative for: Constitutional, Gastrointestinal, Neurological, Skin, Genitourinary, Musculoskeletal, HENT, Endocrine, Cardiovascular, Eyes, Respiratory, Psychiatric, Allergic/Imm, Heme/Lymph Last edited by Silvestre Moment on 09/24/2021  1:14 PM.       ALLERGIES Allergies  Allergen Reactions   Metoclopramide Hcl     REACTION: nervous, climbs the wall   Penicillins     REACTION: hives   Pravastatin    Sulfonamide Derivatives     REACTION: rash    PAST MEDICAL HISTORY Past Medical History:  Diagnosis Date   Atrial fibrillation ([place])    ATRIAL FIBRILLATION S/P  PVI 6/11   CAD (coronary artery disease)    a. 03/2002: cath showing 20% OM1 stenosis, EF at 65%, no WMA. No significant CAD.  GERD (gastroesophageal reflux disease)    Hyperlipidemia    Hypertension    Macular degeneration    SVT (supraventricular tachycardia) (HCC)    Past Surgical History:  Procedure Laterality Date   ABLATION OF DYSRHYTHMIC FOCUS  09/2009   s/p PVI by JA   TOTAL ABDOMINAL HYSTERECTOMY      FAMILY HISTORY Family History  Problem Relation Age of Onset   Stroke Other    Coronary artery disease Other     SOCIAL HISTORY Social History   Tobacco Use   Smoking status: Never   Smokeless tobacco: Never  Vaping Use   Vaping Use: Never used  Substance Use Topics   Alcohol use: No   Drug use: No         OPHTHALMIC EXAM:  Base  Eye Exam     Visual Acuity (ETDRS)       Right Left   Dist cc CF at 2' 20/60 -2 +2   Dist ph cc  20/50    Correction: Glasses         Tonometry (Tonopen, 1:20 PM)       Right Left   Pressure 12 10         Pupils       Pupils APD   Right PERRL None   Left PERRL None         Visual Fields       Left Right    Full Full         Extraocular Movement       Right Left    Full Full         Neuro/Psych     Oriented x3: Yes   Mood/Affect: Normal         Dilation     Both eyes: 1.0% Mydriacyl, 2.5% Phenylephrine @ 1:20 PM           Slit Lamp and Fundus Exam     External Exam       Right Left   External Normal Normal         Slit Lamp Exam       Right Left   Lids/Lashes Normal Normal   Conjunctiva/Sclera White and quiet White and quiet   Cornea Clear Clear   Anterior Chamber Deep and quiet Deep and quiet   Iris Round and reactive Round and reactive   Lens Posterior chamber intraocular lens Posterior chamber intraocular lens   Anterior Vitreous Normal Normal         Fundus Exam       Right Left   Posterior Vitreous  Posterior vitreous detachment   Disc  Normal   C/D Ratio  0.1   Macula  Age related macular degeneration, Atrophy into faz, advanced age related macular degeneration, Retinal pigment epithelial atrophy, Geographic atrophy in the FAZ,    Vessels  Normal   Periphery  Normal            IMAGING AND PROCEDURES  Imaging and Procedures for 09/24/21  OCT, Retina - OU - Both Eyes       Right Eye Quality was good. Scan locations included subfoveal. Central Foveal Thickness: 897. Progression has worsened. Findings include abnormal foveal contour, retinal drusen , cystoid macular edema, disciform scar, intraretinal fluid.   Left Eye Quality was good. Scan locations included subfoveal. Central Foveal Thickness: 246. Progression has been stable. Findings include no IRF, no SRF, abnormal foveal contour, intraretinal  hyper-reflective material.   Notes At 6 week  follow-up today, decreased CME increased fluid and increased thickness left eye from reactivation of CNVM.  Will need repeat Eylea today and examination again in 6-week, OS  OD with chronic subfoveal disciform scar, chronic CME intraretinal fluid, now with retinal hemorrhage enlargement.  Today at 5 months postinjection with no real change in the large chronic disciform scarring and intraretinal fluid. Stable      Intravitreal Injection, Pharmacologic Agent - OS - Left Eye       Time Out 09/24/2021. 1:48 PM. Confirmed correct patient, procedure, site, and patient consented.   Anesthesia Topical anesthesia was used. Anesthetic medications included Lidocaine 4%.   Procedure Preparation included 5% betadine to ocular surface, 10% betadine to eyelids, Tobramycin 0.3%, Ofloxacin . A 30 gauge needle was used.   Injection: 2 mg aflibercept 2 MG/0.05ML   Route: Intravitreal, Site: Left Eye   NDC: [id number], Lot: [id number], Waste: 0 mL   Post-op Post injection exam found visual acuity of at least counting fingers. The patient tolerated the procedure well. There were no complications. The patient received written and verbal post procedure care education. Post injection medications included ocuflox.   Notes OS vastly improved macular findings.  Stable macular findings at 6 weeks postinjection.  We will repeat injection today and extend interval OS next 8 weeks              ASSESSMENT/PLAN:  Exudative age-related macular degeneration of left eye with active choroidal neovascularization (HCC) The nature of wet macular degeneration was discussed with the patient.  Forms of therapy reviewed include the use of Anti-VEGF medications injected painlessly into the eye, as well as other possible treatment modalities, including thermal laser therapy. Fellow eye involvement and risks were discussed with the patient. Upon the finding of wet age  related macular degeneration, treatment will be offered. The treatment regimen is on a treat as needed basis with the intent to treat if necessary and extend interval of exams when possible. On average 1 out of 6 patients do not need lifetime therapy. However, the risk of recurrent disease is high for a lifetime.  Initially monthly, then periodic, examinations and evaluations will determine whether the next treatment is required on the day of the examination.  Recent reactivation of disease center involved at 9-week follow-up.  Now at 6-week follow-up, post Eylea, improved stable acuity and improved macular findings by OCT and exam.  Repeat injection Eylea today and maintain follow-up next in 6 to 7 weeks  Exudative age-related macular degeneration of right eye with inactive choroidal neovascularization ([place]) OD, with massive disciform scar atrophic retinotomy therapy  Advanced nonexudative age-related macular degeneration of both eyes with subfoveal involvement Extensive atrophy of progression OS and OD     ICD-10-CM   1. Exudative age-related macular degeneration of left eye with active choroidal neovascularization (HCC)  H35.3221 OCT, Retina - OU - Both Eyes    Intravitreal Injection, Pharmacologic Agent - OS - Left Eye    aflibercept (EYLEA) SOLN 2 mg    2. Exudative age-related macular degeneration of right eye with inactive choroidal neovascularization ([place])  H35.3212     3. Advanced nonexudative age-related macular degeneration of both eyes with subfoveal involvement  H35.3134       1.  OS, with improved macular anatomy and stable acuity post recent Eylea at 6 weeks.  Current CNVM thus needs treatment again today and reevaluate OS in 6 weeks 2.  OD stable, large disciform scar with atrophic retina  3.  AMD OS  continue to monitor  Ophthalmic Meds Ordered this visit:  Meds ordered this encounter  Medications   aflibercept (EYLEA) SOLN 2 mg       Return in about 6 weeks (around  11/05/2021) for EYLEA OCT, OS, dilate.  There are no Patient Instructions on file for this visit.   Explained the diagnoses, plan, and follow up with the patient and they expressed understanding.  Patient expressed understanding of the importance of proper follow up care.   Clent Demark Christyanna Mckeon M.D. Diseases & Surgery of the Retina and Vitreous Retina & Diabetic [place] 09/24/21     Abbreviations: M myopia (nearsighted); A astigmatism; H hyperopia (farsighted); P presbyopia; Mrx spectacle prescription;  CTL contact lenses; OD right eye; OS left eye; OU both eyes  XT exotropia; ET esotropia; PEK punctate epithelial keratitis; PEE punctate epithelial erosions; DES dry eye syndrome; MGD meibomian gland dysfunction; ATs artificial tears; PFAT's preservative free artificial tears; [place] nuclear sclerotic cataract; PSC posterior subcapsular cataract; ERM epi-retinal membrane; PVD posterior vitreous detachment; RD retinal detachment; DM diabetes mellitus; DR diabetic retinopathy; NPDR non-proliferative diabetic retinopathy; PDR proliferative diabetic retinopathy; CSME clinically significant macular edema; DME diabetic macular edema; dbh dot blot hemorrhages; CWS cotton wool spot; POAG primary open angle glaucoma; C/D cup-to-disc ratio; HVF humphrey visual field; GVF goldmann visual field; OCT optical coherence tomography; IOP intraocular pressure; BRVO Branch retinal vein occlusion; CRVO central retinal vein occlusion; CRAO central retinal artery occlusion; BRAO branch retinal artery occlusion; RT retinal tear; SB scleral buckle; PPV pars plana vitrectomy; VH Vitreous hemorrhage; PRP panretinal laser photocoagulation; IVK intravitreal kenalog; VMT vitreomacular traction; MH Macular hole;  NVD neovascularization of the disc; NVE neovascularization elsewhere; AREDS age related eye disease study; ARMD age related macular degeneration; POAG primary open angle glaucoma; EBMD epithelial/anterior basement membrane  dystrophy; ACIOL anterior chamber intraocular lens; IOL intraocular lens; PCIOL posterior chamber intraocular lens; Phaco/IOL phacoemulsification with intraocular lens placement; PRK photorefractive keratectomy; LASIK laser assisted in situ keratomileusis; HTN hypertension; DM diabetes mellitus; COPD chronic obstructive pulmonary disease   09/24/2021     CHIEF COMPLAINT Patient presents for  Chief Complaint  Patient presents with   Macular Degeneration      HISTORY OF PRESENT ILLNESS: Sylvia [name] is a 88 y.o. female who presents to the clinic today for:   HPI   6 WEEKS for DILATE OU, EYLEA, OCT, OS. Pt stated vision has been stable since last visit. Pt denies new floaters and FOL.  Last edited by Silvestre Moment on 09/24/2021  1:14 PM.      Referring physician: Donnajean Lopes, MD [place],   [id number]  HISTORICAL INFORMATION:   Selected notes from the MEDICAL RECORD NUMBER       CURRENT MEDICATIONS: Current Outpatient Medications (Ophthalmic Drugs)  Medication Sig   hydroxypropyl methylcellulose / hypromellose (ISOPTO TEARS / GONIOVISC) 2.5 % ophthalmic solution Place 1 drop into both eyes as needed for dry eyes.   No current facility-administered medications for this visit. (Ophthalmic Drugs)   Current Outpatient Medications (Other)  Medication Sig   acetaminophen (TYLENOL) 325 MG tablet Take by mouth every 6 (six) hours as needed for mild pain.   amLODipine (NORVASC) 2.5 MG tablet TAKE 2 TABLETS BY MOUTH IN THE MORNING AND 1 IN THE EVENING   calcium citrate-vitamin D (CITRACAL+D) 315-200 MG-UNIT per tablet Take 1 tablet by mouth daily.   diclofenac sodium (VOLTAREN) 1 % GEL Apply 4 g topically 4 (four) times daily.   fluocinonide (LIDEX)  0.05 % external solution 2 (two) times daily. 1-2 GTTS IN EARS TWICE WEEKLY AS NEEDED    levothyroxine (SYNTHROID, LEVOTHROID) 75 MCG tablet Take 75 mcg by mouth daily.     losartan (COZAAR) 100 MG tablet Take 100  mg by mouth daily.     metoprolol tartrate (LOPRESSOR) 50 MG tablet Take 50 mg by mouth 2 (two) times daily.   Multiple Vitamins-Minerals (OCUVITE PRESERVISION PO) Take 1 tablet by mouth 2 (two) times daily.   pantoprazole (PROTONIX) 40 MG tablet Take 40 mg by mouth 2 (two) times daily.   polyethylene glycol powder (GLYCOLAX/MIRALAX) powder Take 17 g by mouth daily. PRN     predniSONE (DELTASONE) 20 MG tablet    promethazine (PHENERGAN) 25 MG tablet Take 25 mg by mouth 3 (three) times daily as needed.   raloxifene (EVISTA) 60 MG tablet Take 60 mg by mouth daily.     sucralfate (CARAFATE) 1 GM/10ML suspension Take 10 mLs (1 g total) by mouth 4 (four) times daily -  with meals and at bedtime.   warfarin (COUMADIN) 5 MG tablet TAKE 1/2 TO 1 TABLET BY MOUTH ONCE DAILY AS DIRECTED BY COUMADIN CLINIC   No current facility-administered medications for this visit. (Other)      REVIEW OF SYSTEMS: ROS   Negative for: Constitutional, Gastrointestinal, Neurological, Skin, Genitourinary, Musculoskeletal, HENT, Endocrine, Cardiovascular, Eyes, Respiratory, Psychiatric, Allergic/Imm, Heme/Lymph Last edited by Silvestre Moment on 09/24/2021  1:14 PM.       ALLERGIES Allergies  Allergen Reactions   Metoclopramide Hcl     REACTION: nervous, climbs the wall   Penicillins     REACTION: hives   Pravastatin    Sulfonamide Derivatives     REACTION: rash    PAST MEDICAL HISTORY Past Medical History:  Diagnosis Date   Atrial fibrillation ([place])    ATRIAL FIBRILLATION S/P  PVI 6/11   CAD (coronary artery disease)    a. 03/2002: cath showing 20% OM1 stenosis, EF at 65%, no WMA. No significant CAD.    GERD (gastroesophageal reflux disease)    Hyperlipidemia    Hypertension    Macular degeneration    SVT (supraventricular tachycardia) (HCC)    Past Surgical History:  Procedure Laterality Date   ABLATION OF DYSRHYTHMIC FOCUS  09/2009   s/p PVI by JA   TOTAL ABDOMINAL HYSTERECTOMY      FAMILY  HISTORY Family History  Problem Relation Age of Onset   Stroke Other    Coronary artery disease Other     SOCIAL HISTORY Social History   Tobacco Use   Smoking status: Never   Smokeless tobacco: Never  Vaping Use   Vaping Use: Never used  Substance Use Topics   Alcohol use: No   Drug use: No         OPHTHALMIC EXAM:  Base Eye Exam     Visual Acuity (ETDRS)       Right Left   Dist cc CF at 2' 20/60 -2 +2   Dist ph cc  20/50    Correction: Glasses         Tonometry (Tonopen, 1:20 PM)       Right Left   Pressure 12 10         Pupils       Pupils APD   Right PERRL None   Left PERRL None         Visual Fields       Left Right    Full Full  Extraocular Movement       Right Left    Full Full         Neuro/Psych     Oriented x3: Yes   Mood/Affect: Normal         Dilation     Both eyes: 1.0% Mydriacyl, 2.5% Phenylephrine @ 1:20 PM           Slit Lamp and Fundus Exam     External Exam       Right Left   External Normal Normal         Slit Lamp Exam       Right Left   Lids/Lashes Normal Normal   Conjunctiva/Sclera White and quiet White and quiet   Cornea Clear Clear   Anterior Chamber Deep and quiet Deep and quiet   Iris Round and reactive Round and reactive   Lens Posterior chamber intraocular lens Posterior chamber intraocular lens   Anterior Vitreous Normal Normal         Fundus Exam       Right Left   Posterior Vitreous  Posterior vitreous detachment   Disc  Normal   C/D Ratio  0.1   Macula  Age related macular degeneration, Atrophy into faz, advanced age related macular degeneration, Retinal pigment epithelial atrophy, Geographic atrophy in the FAZ,    Vessels  Normal   Periphery  Normal            IMAGING AND PROCEDURES  Imaging and Procedures for 09/24/21  OCT, Retina - OU - Both Eyes       Right Eye Quality was good. Scan locations included subfoveal. Central Foveal Thickness:  897. Progression has worsened. Findings include abnormal foveal contour, retinal drusen , cystoid macular edema, disciform scar, intraretinal fluid.   Left Eye Quality was good. Scan locations included subfoveal. Central Foveal Thickness: 246. Progression has been stable. Findings include no IRF, no SRF, abnormal foveal contour, intraretinal hyper-reflective material.   Notes At 6 week follow-up today, decreased CME increased fluid and increased thickness left eye from reactivation of CNVM.  Will need repeat Eylea today and examination again in 6-week, OS  OD with chronic subfoveal disciform scar, chronic CME intraretinal fluid, now with retinal hemorrhage enlargement.  Today at 5 months postinjection with no real change in the large chronic disciform scarring and intraretinal fluid. Stable      Intravitreal Injection, Pharmacologic Agent - OS - Left Eye                   ASSESSMENT/PLAN:  Exudative age-related macular degeneration of left eye with active choroidal neovascularization (HCC) The nature of wet macular degeneration was discussed with the patient.  Forms of therapy reviewed include the use of Anti-VEGF medications injected painlessly into the eye, as well as other possible treatment modalities, including thermal laser therapy. Fellow eye involvement and risks were discussed with the patient. Upon the finding of wet age related macular degeneration, treatment will be offered. The treatment regimen is on a treat as needed basis with the intent to treat if necessary and extend interval of exams when possible. On average 1 out of 6 patients do not need lifetime therapy. However, the risk of recurrent disease is high for a lifetime.  Initially monthly, then periodic, examinations and evaluations will determine whether the next treatment is required on the day of the examination.  Recent reactivation of disease center involved at 9-week follow-up.  Now at 6-week follow-up, post  Eylea, improved stable  acuity and improved macular findings by OCT and exam.  Repeat injection Eylea today and maintain follow-up next in 6 to 7 weeks  Exudative age-related macular degeneration of right eye with inactive choroidal neovascularization ([name]) OD, with massive disciform scar atrophic retinotomy therapy  Advanced nonexudative age-related macular degeneration of both eyes with subfoveal involvement Extensive atrophy of progression OS and OD     ICD-10-CM   1. Exudative age-related macular degeneration of left eye with active choroidal neovascularization (HCC)  H35.3221 OCT, Retina - OU - Both Eyes    Intravitreal Injection, Pharmacologic Agent - OS - Left Eye    2. Exudative age-related macular degeneration of right eye with inactive choroidal neovascularization ([place])  H35.3212     3. Advanced nonexudative age-related macular degeneration of both eyes with subfoveal involvement  H35.3134       1.  OS improved macular findings and stable acuity post most recent injection of Eylea now follow-up at shorter interval.  Repeat injection today to maintain.  2.  Thick atrophy nearing the FAZ OS we will continue to monitor and observe.OS  3.  OD stable over time we will continue to monitor and observe  Ophthalmic Meds Ordered this visit:  No orders of the defined types were placed in this encounter.      Return in about 6 weeks (around 11/05/2021) for EYLEA OCT, OS, dilate.  There are no Patient Instructions on file for this visit.   Explained the diagnoses, plan, and follow up with the patient and they expressed understanding.  Patient expressed understanding of the importance of proper follow up care.   Clent Demark Yolander Goodie M.D. Diseases & Surgery of the Retina and Vitreous Retina & Diabetic [facility] 09/24/21     Abbreviations: M myopia (nearsighted); A astigmatism; H hyperopia (farsighted); P presbyopia; Mrx spectacle prescription;  CTL contact lenses; OD right eye; OS  left eye; OU both eyes  XT exotropia; ET esotropia; PEK punctate epithelial keratitis; PEE punctate epithelial erosions; DES dry eye syndrome; MGD meibomian gland dysfunction; ATs artificial tears; PFAT's preservative free artificial tears; [place] nuclear sclerotic cataract; PSC posterior subcapsular cataract; ERM epi-retinal membrane; PVD posterior vitreous detachment; RD retinal detachment; DM diabetes mellitus; DR diabetic retinopathy; NPDR non-proliferative diabetic retinopathy; PDR proliferative diabetic retinopathy; CSME clinically significant macular edema; DME diabetic macular edema; dbh dot blot hemorrhages; CWS cotton wool spot; POAG primary open angle glaucoma; C/D cup-to-disc ratio; HVF humphrey visual field; GVF goldmann visual field; OCT optical coherence tomography; IOP intraocular pressure; BRVO Branch retinal vein occlusion; CRVO central retinal vein occlusion; CRAO central retinal artery occlusion; BRAO branch retinal artery occlusion; RT retinal tear; SB scleral buckle; PPV pars plana vitrectomy; VH Vitreous hemorrhage; PRP panretinal laser photocoagulation; IVK intravitreal kenalog; VMT vitreomacular traction; MH Macular hole;  NVD neovascularization of the disc; NVE neovascularization elsewhere; AREDS age related eye disease study; ARMD age related macular degeneration; POAG primary open angle glaucoma; EBMD epithelial/anterior basement membrane dystrophy; ACIOL anterior chamber intraocular lens; IOL intraocular lens; PCIOL posterior chamber intraocular lens; Phaco/IOL phacoemulsification with intraocular lens placement; [place] photorefractive keratectomy; LASIK laser assisted in situ keratomileusis; HTN hypertension; DM diabetes mellitus; COPD chronic obstructive pulmonary disease

## 2021-09-24 NOTE — Assessment & Plan Note (Signed)
Extensive atrophy of progression OS and OD

## 2021-09-24 NOTE — Assessment & Plan Note (Signed)
The nature of wet macular degeneration was discussed with the patient.  Forms of therapy reviewed include the use of Anti-VEGF medications injected painlessly into the eye, as well as other possible treatment modalities, including thermal laser therapy. Fellow eye involvement and risks were discussed with the patient. Upon the finding of wet age related macular degeneration, treatment will be offered. The treatment regimen is on a treat as needed basis with the intent to treat if necessary and extend interval of exams when possible. On average 1 out of 6 patients do not need lifetime therapy. However, the risk of recurrent disease is high for a lifetime.  Initially monthly, then periodic, examinations and evaluations will determine whether the next treatment is required on the day of the examination.  Recent reactivation of disease center involved at 9-week follow-up.  Now at 6-week follow-up, post Eylea, improved stable acuity and improved macular findings by OCT and exam.  Repeat injection Eylea today and maintain follow-up next in 6 to 7 weeks

## 2021-09-24 NOTE — Assessment & Plan Note (Signed)
OD, with massive disciform scar atrophic retinotomy therapy

## 2021-10-02 ENCOUNTER — Encounter: Payer: Self-pay | Admitting: Orthopaedic Surgery

## 2021-10-02 ENCOUNTER — Ambulatory Visit (INDEPENDENT_AMBULATORY_CARE_PROVIDER_SITE_OTHER): Payer: Medicare Other

## 2021-10-02 ENCOUNTER — Ambulatory Visit: Payer: Self-pay

## 2021-10-02 ENCOUNTER — Telehealth: Payer: Self-pay

## 2021-10-02 ENCOUNTER — Ambulatory Visit (INDEPENDENT_AMBULATORY_CARE_PROVIDER_SITE_OTHER): Payer: Medicare Other | Admitting: Orthopaedic Surgery

## 2021-10-02 DIAGNOSIS — M25561 Pain in right knee: Secondary | ICD-10-CM | POA: Diagnosis not present

## 2021-10-02 DIAGNOSIS — M170 Bilateral primary osteoarthritis of knee: Secondary | ICD-10-CM | POA: Diagnosis not present

## 2021-10-02 DIAGNOSIS — M25562 Pain in left knee: Secondary | ICD-10-CM | POA: Diagnosis not present

## 2021-10-02 DIAGNOSIS — G8929 Other chronic pain: Secondary | ICD-10-CM

## 2021-10-02 NOTE — Progress Notes (Signed)
Office Visit Note   Patient: Sylvia [name]           Date of Birth: [birth year]           MRN: [id number] Visit Date: 10/02/2021              Requested by: Sylvia Lopes, MD [place],  [place] [id number] PCP: Sylvia Lopes, MD   Assessment & Plan: Visit Diagnoses:  1. Chronic pain of both knees   2. Bilateral primary osteoarthritis of knee     Plan: Sylvia [name] has advanced osteoarthritis in both knees.  She is somewhat limited in her ability to take NSAIDs because she is on warfarin.  Has had prior cortisone injections "without much relief".  Films today were obtained of both knees in the standing projection and demonstrate advanced arthritis in all 3 compartments but predominantly in the lateral compartment.  Discussion regarding her diagnosis and treatment options.  Certainly she is not a candidate for knee replacement based on all of her medical comorbidities and heart disease.  She might do well with viscosupplementation.  She would like to try this so we will have it precertified This patient is diagnosed with osteoarthritis of the knee(s).    Radiographs show evidence of joint space narrowing, osteophytes, subchondral sclerosis and/or subchondral cysts.  This patient has knee pain which interferes with functional and activities of daily living.    This patient has experienced inadequate response, adverse effects and/or intolerance with conservative treatments such as acetaminophen, NSAIDS, topical creams, physical therapy or regular exercise, knee bracing and/or weight loss.   This patient has experienced inadequate response or has a contraindication to intra articular steroid injections for at least 3 months.   This patient is not scheduled to have a total knee replacement within 6 months of starting treatment with viscosupplementation.   Follow-Up Instructions: Return We will pre-CERT viscosupplementation.   Orders:  Orders Placed This Encounter   Procedures   XR KNEE 3 VIEW LEFT   XR KNEE 3 VIEW RIGHT   No orders of the defined types were placed in this encounter.     Procedures: No procedures performed   Clinical Data: No additional findings.   Subjective: Chief Complaint  Patient presents with   Right Knee - Pain   Left Knee - Pain  Patient presents today for bilateral knee pain. She was last seen in December of last year and received a left knee cortisone injection. She said that the injection did not help at all. She has pain equally in both knees. More pain laterally. She is taking Tylenol at night. She wants to get some prescribed for pain relief because OTC medicines do not help. She is not diabetic.  Also taking warfarin for her heart disease-permanent atrial fibrillation and SVT  HPI  Review of Systems   Objective: Vital Signs: There were no vitals taken for this visit.  Physical Exam Constitutional:      Appearance: She is well-developed.  Pulmonary:     Effort: Pulmonary effort is normal.  Skin:    General: Skin is warm and dry.  Neurological:     Mental Status: She is alert and oriented to person, place, and time.  Psychiatric:        Behavior: Behavior normal.     Ortho Exam awake alert and oriented x3.  Comfortable sitting.  No acute distress.  Full extension of both knees and maybe 95 to 100 degrees of flexion.  There is no instability.  Muscle tone is decreased in both of her thighs.  Might have a very minimal effusion in her knees but there is no increased warmth.  No instability.  Increased valgus of several degrees and mostly lateral joint pain bilaterally.  No calf discomfort.  Motor exam intact.  Specialty Comments:  No specialty comments available.  Imaging: XR KNEE 3 VIEW RIGHT  Result Date: 10/02/2021 Films of the right knee were obtained in 3 projections standing and compared to the left knee.  Films are somewhat similar but there is more narrowing of the lateral compartment  compared to the left.  In the lateral compartment there are subchondral cysts sclerotic changes on both sides of the joint, peripheral osteophytes and small subchondral cyst.  There might be some calcification in the menisci.  Injuries are also noted in the medial compartment and patellofemoral joint as well.  Films are consistent with advanced osteoarthritis  XR KNEE 3 VIEW LEFT  Result Date: 10/02/2021 Films of the left knee were obtained in 3 projections standing and compared to her right knee films today.  There is considerable tricompartmental degenerative changes predominantly in the lateral compartment where there is more narrowing, subchondral sclerosis on both sides of the joint and peripheral osteophytes.  Lesser changes are noted in the medial compartment and the patellofemoral joint.  No loose bodies.  Increased valgus    PMFS History: Patient Active Problem List   Diagnosis Date Noted   Bilateral primary osteoarthritis of knee 01/11/2020   Advanced nonexudative age-related macular degeneration of both eyes with subfoveal involvement 12/06/2019   Exudative age-related macular degeneration of left eye with active choroidal neovascularization ([place]) 07/21/2019   Exudative age-related macular degeneration of right eye with inactive choroidal neovascularization ([place]) 07/21/2019   Cystoid macular edema of right eye 07/21/2019   Posterior vitreous detachment of left eye 07/21/2019   Cervical spondylosis without myelopathy 01/18/2019   Spondylolisthesis of cervical region 01/18/2019   Obstructive sleep apnea treated with continuous positive airway pressure (CPAP) 06/02/2018   Unilateral primary osteoarthritis, right knee 03/02/2017   Unilateral primary osteoarthritis, right knee 03/02/2017   Long term (current) use of anticoagulants 07/26/2010   FATIGUE 05/23/2010   HYPERCHOLESTEROLEMIA, PURE 02/13/2008   HYPERTENSION, BENIGN 02/13/2008   SVT/ PSVT/ PAT 02/13/2008   ATRIAL FIBRILLATION  02/13/2008   GERD 02/13/2008   PALPITATIONS 02/13/2008   Past Medical History:  Diagnosis Date   Atrial fibrillation ([place])    ATRIAL FIBRILLATION S/P  PVI 6/11   CAD (coronary artery disease)    a. 03/2002: cath showing 20% OM1 stenosis, EF at 65%, no WMA. No significant CAD.    GERD (gastroesophageal reflux disease)    Hyperlipidemia    Hypertension    Macular degeneration    SVT (supraventricular tachycardia) (HCC)     Family History  Problem Relation Age of Onset   Stroke Other    Coronary artery disease Other     Past Surgical History:  Procedure Laterality Date   ABLATION OF DYSRHYTHMIC FOCUS  09/2009   s/p PVI by JA   TOTAL ABDOMINAL HYSTERECTOMY     Social History   Occupational History   Not on file  Tobacco Use   Smoking status: Never   Smokeless tobacco: Never  Vaping Use   Vaping Use: Never used  Substance and Sexual Activity   Alcohol use: No   Drug use: No   Sexual activity: Not on file

## 2021-10-02 NOTE — Telephone Encounter (Signed)
Please precert for bilateral visco. Dr.Whitfield's patient. Thanks!

## 2021-10-03 DIAGNOSIS — Z7901 Long term (current) use of anticoagulants: Secondary | ICD-10-CM | POA: Diagnosis not present

## 2021-10-03 DIAGNOSIS — I4891 Unspecified atrial fibrillation: Secondary | ICD-10-CM | POA: Diagnosis not present

## 2021-10-04 NOTE — Telephone Encounter (Signed)
Noted  

## 2021-10-31 DIAGNOSIS — I4891 Unspecified atrial fibrillation: Secondary | ICD-10-CM | POA: Diagnosis not present

## 2021-10-31 DIAGNOSIS — Z7901 Long term (current) use of anticoagulants: Secondary | ICD-10-CM | POA: Diagnosis not present

## 2021-11-05 ENCOUNTER — Ambulatory Visit (INDEPENDENT_AMBULATORY_CARE_PROVIDER_SITE_OTHER): Payer: Medicare Other | Admitting: Ophthalmology

## 2021-11-05 ENCOUNTER — Encounter (INDEPENDENT_AMBULATORY_CARE_PROVIDER_SITE_OTHER): Payer: Self-pay | Admitting: Ophthalmology

## 2021-11-05 DIAGNOSIS — H353212 Exudative age-related macular degeneration, right eye, with inactive choroidal neovascularization: Secondary | ICD-10-CM | POA: Diagnosis not present

## 2021-11-05 DIAGNOSIS — H353221 Exudative age-related macular degeneration, left eye, with active choroidal neovascularization: Secondary | ICD-10-CM

## 2021-11-05 MED ORDER — AFLIBERCEPT 2MG/0.05ML IZ SOLN FOR KALEIDOSCOPE
2.0000 mg | INTRAVITREAL | Status: AC | PRN
  Administered 2021-11-05: 2 mg via INTRAVITREAL

## 2021-11-05 NOTE — Telephone Encounter (Signed)
Submited for VOB on [email or web address] 

## 2021-11-05 NOTE — Progress Notes (Signed)
11/05/2021     CHIEF COMPLAINT Patient presents for  Chief Complaint  Patient presents with   Macular Degeneration      HISTORY OF PRESENT ILLNESS: Sylvia [name] is a 88 y.o. female who presents to the clinic today for:     Referring physician: Donnajean Lopes, MD [place],  [place] [id number]  HISTORICAL INFORMATION:   Selected notes from the [facility]: Current Outpatient Medications (Ophthalmic Drugs)  Medication Sig   hydroxypropyl methylcellulose / hypromellose (ISOPTO TEARS / GONIOVISC) 2.5 % ophthalmic solution Place 1 drop into both eyes as needed for dry eyes.   No current facility-administered medications for this visit. (Ophthalmic Drugs)   Current Outpatient Medications (Other)  Medication Sig   acetaminophen (TYLENOL) 325 MG tablet Take by mouth every 6 (six) hours as needed for mild pain.   amLODipine (NORVASC) 2.5 MG tablet TAKE 2 TABLETS BY MOUTH IN THE MORNING AND 1 IN THE EVENING   calcium citrate-vitamin D (CITRACAL+D) 315-200 MG-UNIT per tablet Take 1 tablet by mouth daily.   diclofenac sodium (VOLTAREN) 1 % GEL Apply 4 g topically 4 (four) times daily.   fluocinonide (LIDEX) 0.05 % external solution 2 (two) times daily. 1-2 GTTS IN EARS TWICE WEEKLY AS NEEDED    levothyroxine (SYNTHROID, LEVOTHROID) 75 MCG tablet Take 75 mcg by mouth daily.     losartan (COZAAR) 100 MG tablet Take 100 mg by mouth daily.     metoprolol tartrate (LOPRESSOR) 50 MG tablet Take 50 mg by mouth 2 (two) times daily.   Multiple Vitamins-Minerals (OCUVITE PRESERVISION PO) Take 1 tablet by mouth 2 (two) times daily.   pantoprazole (PROTONIX) 40 MG tablet Take 40 mg by mouth 2 (two) times daily.   polyethylene glycol powder (GLYCOLAX/MIRALAX) powder Take 17 g by mouth daily. PRN     predniSONE (DELTASONE) 20 MG tablet    promethazine (PHENERGAN) 25 MG tablet Take 25 mg by mouth 3 (three) times daily as needed.    raloxifene (EVISTA) 60 MG tablet Take 60 mg by mouth daily.     sucralfate (CARAFATE) 1 GM/10ML suspension Take 10 mLs (1 g total) by mouth 4 (four) times daily -  with meals and at bedtime.   warfarin (COUMADIN) 5 MG tablet TAKE 1/2 TO 1 TABLET BY MOUTH ONCE DAILY AS DIRECTED BY COUMADIN CLINIC   No current facility-administered medications for this visit. (Other)      REVIEW OF SYSTEMS: ROS   Negative for: Constitutional, Gastrointestinal, Neurological, Skin, Genitourinary, Musculoskeletal, HENT, Endocrine, Cardiovascular, Eyes, Respiratory, Psychiatric, Allergic/Imm, Heme/Lymph Last edited by Hurman Horn, MD on 11/05/2021  1:04 PM.       ALLERGIES Allergies  Allergen Reactions   Metoclopramide Hcl     REACTION: nervous, climbs the wall   Penicillins     REACTION: hives   Pravastatin    Sulfonamide Derivatives     REACTION: rash    PAST MEDICAL HISTORY Past Medical History:  Diagnosis Date   Atrial fibrillation ([place])    ATRIAL FIBRILLATION S/P  PVI 6/11   CAD (coronary artery disease)    a. 03/2002: cath showing 20% OM1 stenosis, EF at 65%, no WMA. No significant CAD.    GERD (gastroesophageal reflux disease)    Hyperlipidemia    Hypertension    Macular degeneration    SVT (supraventricular tachycardia) (HCC)    Past Surgical History:  Procedure Laterality Date   ABLATION  OF DYSRHYTHMIC FOCUS  09/2009   s/p PVI by JA   TOTAL ABDOMINAL HYSTERECTOMY      FAMILY HISTORY Family History  Problem Relation Age of Onset   Stroke Other    Coronary artery disease Other     SOCIAL HISTORY Social History   Tobacco Use   Smoking status: Never   Smokeless tobacco: Never  Vaping Use   Vaping Use: Never used  Substance Use Topics   Alcohol use: No   Drug use: No         OPHTHALMIC EXAM:  Base Eye Exam     Visual Acuity (ETDRS)       Right Left   Dist cc CF at 3' 20/50 -1    Correction: Glasses         Tonometry (Tonopen, 1:06 PM)       Right  Left   Pressure 14 15         Pupils       Pupils APD   Right PERRL None   Left PERRL          Visual Fields       Left Right    Full    Restrictions  Partial inner superior temporal, inferior temporal, superior nasal, inferior nasal deficiencies         Neuro/Psych     Oriented x3: Yes   Mood/Affect: Normal           Slit Lamp and Fundus Exam     External Exam       Right Left   External Normal Normal         Slit Lamp Exam       Right Left   Lids/Lashes Normal Normal   Conjunctiva/Sclera White and quiet White and quiet   Cornea Clear Clear   Anterior Chamber Deep and quiet Deep and quiet   Iris Round and reactive Round and reactive   Lens Posterior chamber intraocular lens Posterior chamber intraocular lens   Anterior Vitreous Normal Normal         Fundus Exam       Right Left   Posterior Vitreous  Posterior vitreous detachment   Disc  Normal   C/D Ratio  0.1   Macula  Age related macular degeneration, Atrophy into faz, advanced age related macular degeneration, Retinal pigment epithelial atrophy, Geographic atrophy in the FAZ,    Vessels  Normal   Periphery  Normal            IMAGING AND PROCEDURES  Imaging and Procedures for 11/05/21  OCT, Retina - OU - Both Eyes       Right Eye Quality was good. Scan locations included subfoveal. Central Foveal Thickness: 891. Progression has worsened. Findings include abnormal foveal contour, retinal drusen , cystoid macular edema, disciform scar, intraretinal fluid.   Left Eye Quality was good. Scan locations included subfoveal. Central Foveal Thickness: 251. Progression has been stable. Findings include no IRF, no SRF, abnormal foveal contour, intraretinal hyper-reflective material.   Notes At 6 week follow-up today, decreased CME  and decreased fluid and thickness as well.  Will need repeat Eylea today and examination again in 6-week, OS  OD with chronic subfoveal disciform scar,  chronic CME intraretinal fluid, now with retinal hemorrhage enlargement.  Today at 5 months postinjection with no real change in the large chronic disciform scarring and intraretinal fluid. Stable      Intravitreal Injection, Pharmacologic Agent - OS - Left Eye  Time Out 11/05/2021. 1:08 PM. Confirmed correct patient, procedure, site, and patient consented.   Anesthesia Topical anesthesia was used. Anesthetic medications included Lidocaine 4%.   Procedure Preparation included 5% betadine to ocular surface, 10% betadine to eyelids, Tobramycin 0.3%, Ofloxacin . A 30 gauge needle was used.   Injection: 2 mg aflibercept 2 MG/0.05ML   Route: Intravitreal, Site: Left Eye   NDC: A3590391, Lot: [id number], Expiration date: 12/15/2022, Waste: 0 mL   Post-op Post injection exam found visual acuity of at least counting fingers. The patient tolerated the procedure well. There were no complications. The patient received written and verbal post procedure care education. Post injection medications included ocuflox.   Notes OS vastly improved macular findings.  Stable macular findings at 6 weeks postinjection.  We will repeat injection today and extend interval OS next 8 weeks              ASSESSMENT/PLAN:  Exudative age-related macular degeneration of right eye with inactive choroidal neovascularization (HCC) Chronic active CME from disciform scar with atrophic retina not treatable.  OD.  Exudative age-related macular degeneration of left eye with active choroidal neovascularization ([place]) Much improved macular anatomy and stable acuity on Eylea.  Much less subretinal fluid.  Repeat injection today at 6-week interval reevaluate 6 weeks     ICD-10-CM   1. Exudative age-related macular degeneration of left eye with active choroidal neovascularization (HCC)  H35.3221 OCT, Retina - OU - Both Eyes    Intravitreal Injection, Pharmacologic Agent - OS - Left Eye    aflibercept (EYLEA)  SOLN 2 mg    2. Exudative age-related macular degeneration of right eye with inactive choroidal neovascularization ([place])  H35.3212       1.  OS improved macular anatomy and preserve visual function now on intravitreal Eylea.  Repeat Eylea today and reevaluate again in 6 weeks  2.  OD no interval change.  Continue observe  3.  Ophthalmic Meds Ordered this visit:  Meds ordered this encounter  Medications   aflibercept (EYLEA) SOLN 2 mg       Return in about 6 weeks (around 12/17/2021) for dilate, OS, EYLEA OCT.  There are no Patient Instructions on file for this visit.   Explained the diagnoses, plan, and follow up with the patient and they expressed understanding.  Patient expressed understanding of the importance of proper follow up care.   Clent Demark Tianni Escamilla M.D. Diseases & Surgery of the Retina and Vitreous Retina [facility] Diabetic [place] 11/05/21     Abbreviations: M myopia (nearsighted); A astigmatism; H hyperopia (farsighted); P presbyopia; Mrx spectacle prescription;  CTL contact lenses; OD right eye; OS left eye; OU both eyes  XT exotropia; ET esotropia; PEK punctate epithelial keratitis; PEE punctate epithelial erosions; DES dry eye syndrome; MGD meibomian gland dysfunction; ATs artificial tears; PFAT's preservative free artificial tears; [place] nuclear sclerotic cataract; PSC posterior subcapsular cataract; ERM epi-retinal membrane; PVD posterior vitreous detachment; RD retinal detachment; DM diabetes mellitus; DR diabetic retinopathy; NPDR non-proliferative diabetic retinopathy; PDR proliferative diabetic retinopathy; CSME clinically significant macular edema; DME diabetic macular edema; dbh dot blot hemorrhages; CWS cotton wool spot; POAG primary open angle glaucoma; C/D cup-to-disc ratio; HVF humphrey visual field; GVF goldmann visual field; OCT optical coherence tomography; IOP intraocular pressure; BRVO Branch retinal vein occlusion; CRVO central retinal vein occlusion; CRAO  central retinal artery occlusion; BRAO branch retinal artery occlusion; RT retinal tear; SB scleral buckle; PPV pars plana vitrectomy; VH Vitreous hemorrhage; PRP panretinal laser photocoagulation; IVK intravitreal  kenalog; VMT vitreomacular traction; MH Macular hole;  NVD neovascularization of the disc; NVE neovascularization elsewhere; AREDS age related eye disease study; ARMD age related macular degeneration; POAG primary open angle glaucoma; EBMD epithelial/anterior basement membrane dystrophy; ACIOL anterior chamber intraocular lens; IOL intraocular lens; PCIOL posterior chamber intraocular lens; Phaco/IOL phacoemulsification with intraocular lens placement; [place] photorefractive keratectomy; LASIK laser assisted in situ keratomileusis; HTN hypertension; DM diabetes mellitus; COPD chronic obstructive pulmonary disease

## 2021-11-05 NOTE — Assessment & Plan Note (Signed)
Much improved macular anatomy and stable acuity on Eylea.  Much less subretinal fluid.  Repeat injection today at 6-week interval reevaluate 6 weeks

## 2021-11-05 NOTE — Assessment & Plan Note (Signed)
Chronic active CME from disciform scar with atrophic retina not treatable.  OD.

## 2021-11-07 ENCOUNTER — Telehealth: Payer: Self-pay

## 2021-11-07 NOTE — Telephone Encounter (Signed)
IC patient to schedule monovisc injection in bilateral knees PA not required Copay/coinsurance 20% Deductible $226 has been met Patient has secondary that will cover remaining at 100%

## 2021-11-13 ENCOUNTER — Ambulatory Visit (INDEPENDENT_AMBULATORY_CARE_PROVIDER_SITE_OTHER): Payer: Medicare Other | Admitting: Orthopaedic Surgery

## 2021-11-13 ENCOUNTER — Encounter: Payer: Self-pay | Admitting: Orthopaedic Surgery

## 2021-11-13 DIAGNOSIS — M1711 Unilateral primary osteoarthritis, right knee: Secondary | ICD-10-CM

## 2021-11-13 DIAGNOSIS — M170 Bilateral primary osteoarthritis of knee: Secondary | ICD-10-CM

## 2021-11-13 MED ORDER — HYALURONAN 88 MG/4ML IX SOSY
88.0000 mg | PREFILLED_SYRINGE | INTRA_ARTICULAR | Status: AC | PRN
  Administered 2021-11-13: 88 mg via INTRA_ARTICULAR

## 2021-11-13 NOTE — Progress Notes (Signed)
Office Visit Note   Patient: Sylvia [name]           Date of Birth: 1932/07/07           MRN: [id number] Visit Date: 11/13/2021              Requested by: Donnajean Lopes, MD [place],  [place] [id number] PCP: Donnajean Lopes, MD   Assessment & Plan: Visit Diagnoses:  1. Bilateral primary osteoarthritis of knee     Plan: Mrs. Lona has been approved for Monovisc viscosupplementation injections.  I injected the right knee today and will return next week for the left knee.  She preferred to have 1 knee injected at a time.  Follow-Up Instructions: Return in about 1 week (around 11/20/2021).   Orders:  Orders Placed This Encounter  Procedures   Large Joint Inj: R knee   No orders of the defined types were placed in this encounter.     Procedures: Large Joint Inj: R knee on 11/13/2021 11:05 AM Indications: pain and joint swelling Details: 25 G 1.5 in needle  Arthrogram: No  Medications: 88 mg Hyaluronan 88 MG/4ML Outcome: tolerated well, no immediate complications Procedure, treatment alternatives, risks and benefits explained, specific risks discussed. Consent was given by the patient. Immediately prior to procedure a time out was called to verify the correct patient, procedure, equipment, support staff and site/side marked as required. Patient was prepped and draped in the usual sterile fashion.       Clinical Data: No additional findings.   Subjective: Chief Complaint  Patient presents with   Left Knee - Follow-up   Right Knee - Follow-up   Patient presents today to receive her bilateral knee Monovisc injections today. She states that her left and right knee have been giving the same symptoms. She is currently not taking any medications for her pain at this time.   Review of Systems   Objective: Vital Signs: There were no vitals taken for this visit.  Physical Exam  Ortho Exam right knee was not hot red warm or swollen.  No obvious  effusion.  Slight increased valgus with weightbearing.  No instability  Specialty Comments:  No specialty comments available.  Imaging: No results found.   PMFS History: Patient Active Problem List   Diagnosis Date Noted   Bilateral primary osteoarthritis of knee 01/11/2020   Advanced nonexudative age-related macular degeneration of both eyes with subfoveal involvement 12/06/2019   Exudative age-related macular degeneration of left eye with active choroidal neovascularization ([place]) 07/21/2019   Exudative age-related macular degeneration of right eye with inactive choroidal neovascularization ([place]) 07/21/2019   Cystoid macular edema of right eye 07/21/2019   Posterior vitreous detachment of left eye 07/21/2019   Cervical spondylosis without myelopathy 01/18/2019   Spondylolisthesis of cervical region 01/18/2019   Obstructive sleep apnea treated with continuous positive airway pressure (CPAP) 06/02/2018   Unilateral primary osteoarthritis, right knee 03/02/2017   Unilateral primary osteoarthritis, right knee 03/02/2017   Long term (current) use of anticoagulants 07/26/2010   FATIGUE 05/23/2010   HYPERCHOLESTEROLEMIA, PURE 02/13/2008   HYPERTENSION, BENIGN 02/13/2008   SVT/ PSVT/ PAT 02/13/2008   ATRIAL FIBRILLATION 02/13/2008   GERD 02/13/2008   PALPITATIONS 02/13/2008   Past Medical History:  Diagnosis Date   Atrial fibrillation ([place])    ATRIAL FIBRILLATION S/P  PVI 6/11   CAD (coronary artery disease)    a. 03/2002: cath showing 20% OM1 stenosis, EF at 65%, no WMA. No significant  CAD.    GERD (gastroesophageal reflux disease)    Hyperlipidemia    Hypertension    Macular degeneration    SVT (supraventricular tachycardia) (HCC)     Family History  Problem Relation Age of Onset   Stroke Other    Coronary artery disease Other     Past Surgical History:  Procedure Laterality Date   ABLATION OF DYSRHYTHMIC FOCUS  09/2009   s/p PVI by JA   TOTAL ABDOMINAL HYSTERECTOMY      Social History   Occupational History   Not on file  Tobacco Use   Smoking status: Never   Smokeless tobacco: Never  Vaping Use   Vaping Use: Never used  Substance and Sexual Activity   Alcohol use: No   Drug use: No   Sexual activity: Not on file

## 2021-11-20 ENCOUNTER — Encounter: Payer: Self-pay | Admitting: Physician Assistant

## 2021-11-20 ENCOUNTER — Ambulatory Visit (INDEPENDENT_AMBULATORY_CARE_PROVIDER_SITE_OTHER): Payer: Medicare Other | Admitting: Physician Assistant

## 2021-11-20 DIAGNOSIS — M13862 Other specified arthritis, left knee: Secondary | ICD-10-CM

## 2021-11-20 DIAGNOSIS — M170 Bilateral primary osteoarthritis of knee: Secondary | ICD-10-CM

## 2021-11-20 DIAGNOSIS — M1712 Unilateral primary osteoarthritis, left knee: Secondary | ICD-10-CM | POA: Diagnosis not present

## 2021-11-20 MED ORDER — HYALURONAN 88 MG/4ML IX SOSY
88.0000 mg | PREFILLED_SYRINGE | INTRA_ARTICULAR | Status: AC | PRN
  Administered 2021-11-20: 88 mg via INTRA_ARTICULAR

## 2021-11-20 NOTE — Progress Notes (Signed)
Office Visit Note   Patient: Sylvia [name]           Date of Birth: 1932/12/20           MRN: [id number] Visit Date: 11/20/2021              Requested by: Donnajean Lopes, MD [address] [place],  [place] [address] PCP: Donnajean Lopes, MD  Chief Complaint  Patient presents with  . Left Knee - Pain      HPI: Patient presents for her Monovisc injection into her left knee.  She had an injection a week ago into the right knee.  She thinks it is getting better.  Assessment & Plan: Visit Diagnoses:  1. Allergic arthritis of left knee     Plan: Patient may follow-up as needed tolerated this ejection well  Follow-Up Instructions: No follow-ups on file.   Ortho Exam  Patient is alert, oriented, no adenopathy, well-dressed, normal affect, normal respiratory effort. Left knee no effusion no swelling no redness.  Well-healed surgical portals from previous surgery  Imaging: No results found. No images are attached to the encounter.  Labs: No results found for: "HGBA1C", "ESRSEDRATE", "CRP", "LABURIC", "REPTSTATUS", "GRAMSTAIN", "CULT", "LABORGA"   Lab Results  Component Value Date   ALBUMIN 3.4 (L) 04/17/2020   ALBUMIN 3.7 07/14/2017   ALBUMIN 3.9 12/02/2010    Lab Results  Component Value Date   MG 2.2 10/31/2010   MG 2.1 09/28/2009   No results found for: "VD25OH"  No results found for: "PREALBUMIN"    Latest Ref Rng & Units 04/17/2020    4:20 PM 07/14/2017    9:55 AM 03/12/2015   10:10 AM  CBC EXTENDED  WBC 4.0 - 10.5 K/uL 6.9  7.6  4.7   RBC 3.87 - 5.11 MIL/uL 4.46  4.81  4.34   Hemoglobin 12.0 - 15.0 g/dL 13.8  14.9  13.4   HCT 36.0 - 46.0 % 41.4  44.6  41.0   Platelets 150 - 400 K/uL 226  222  232      There is no height or weight on file to calculate BMI.  Orders:  No orders of the defined types were placed in this encounter.  No orders of the defined types were placed in this encounter.    Procedures: Large Joint Inj: L knee on  11/20/2021 1:25 PM Indications: pain and diagnostic evaluation Details: 22 G 1.5 in needle, anteromedial approach  Arthrogram: No  Medications: 88 mg Hyaluronan 88 MG/4ML Outcome: tolerated well, no immediate complications Procedure, treatment alternatives, risks and benefits explained, specific risks discussed. Consent was given by the patient.    Clinical Data: No additional findings.  ROS:  All other systems negative, except as noted in the HPI. Review of Systems  Objective: Vital Signs: There were no vitals taken for this visit.  Specialty Comments:  No specialty comments available.  PMFS History: Patient Active Problem List   Diagnosis Date Noted  . Allergic arthritis of left knee 11/20/2021  . Bilateral primary osteoarthritis of knee 01/11/2020  . Advanced nonexudative age-related macular degeneration of both eyes with subfoveal involvement 12/06/2019  . Exudative age-related macular degeneration of left eye with active choroidal neovascularization ([place]) 07/21/2019  . Exudative age-related macular degeneration of right eye with inactive choroidal neovascularization ([place]) 07/21/2019  . Cystoid macular edema of right eye 07/21/2019  . Posterior vitreous detachment of left eye 07/21/2019  . Cervical spondylosis without myelopathy 01/18/2019  . Spondylolisthesis of cervical  region 01/18/2019  . Obstructive sleep apnea treated with continuous positive airway pressure (CPAP) 06/02/2018  . Unilateral primary osteoarthritis, right knee 03/02/2017  . Unilateral primary osteoarthritis, right knee 03/02/2017  . Long term (current) use of anticoagulants 07/26/2010  . FATIGUE 05/23/2010  . HYPERCHOLESTEROLEMIA, PURE 02/13/2008  . HYPERTENSION, BENIGN 02/13/2008  . SVT/ PSVT/ PAT 02/13/2008  . ATRIAL FIBRILLATION 02/13/2008  . GERD 02/13/2008  . PALPITATIONS 02/13/2008   Past Medical History:  Diagnosis Date  . Atrial fibrillation ([place])    ATRIAL FIBRILLATION S/P  PVI 6/11   . CAD (coronary artery disease)    a. 03/2002: cath showing 20% OM1 stenosis, EF at 65%, no WMA. No significant CAD.   [place] Kitchen GERD (gastroesophageal reflux disease)   . Hyperlipidemia   . Hypertension   . Macular degeneration   . SVT (supraventricular tachycardia) (HCC)     Family History  Problem Relation Age of Onset  . Stroke Other   . Coronary artery disease Other     Past Surgical History:  Procedure Laterality Date  . ABLATION OF DYSRHYTHMIC FOCUS  09/2009   s/p PVI by JA  . TOTAL ABDOMINAL HYSTERECTOMY     Social History   Occupational History  . Not on file  Tobacco Use  . Smoking status: Never  . Smokeless tobacco: Never  Vaping Use  . Vaping Use: Never used  Substance and Sexual Activity  . Alcohol use: No  . Drug use: No  . Sexual activity: Not on file

## 2021-11-28 DIAGNOSIS — Z7901 Long term (current) use of anticoagulants: Secondary | ICD-10-CM | POA: Diagnosis not present

## 2021-11-28 DIAGNOSIS — I4891 Unspecified atrial fibrillation: Secondary | ICD-10-CM | POA: Diagnosis not present

## 2021-11-28 DIAGNOSIS — D6859 Other primary thrombophilia: Secondary | ICD-10-CM | POA: Diagnosis not present

## 2021-12-06 DIAGNOSIS — D6859 Other primary thrombophilia: Secondary | ICD-10-CM | POA: Diagnosis not present

## 2021-12-06 DIAGNOSIS — I4891 Unspecified atrial fibrillation: Secondary | ICD-10-CM | POA: Diagnosis not present

## 2021-12-06 DIAGNOSIS — Z7901 Long term (current) use of anticoagulants: Secondary | ICD-10-CM | POA: Diagnosis not present

## 2021-12-19 ENCOUNTER — Ambulatory Visit (INDEPENDENT_AMBULATORY_CARE_PROVIDER_SITE_OTHER): Payer: Medicare Other | Admitting: Ophthalmology

## 2021-12-19 ENCOUNTER — Encounter (INDEPENDENT_AMBULATORY_CARE_PROVIDER_SITE_OTHER): Payer: Self-pay | Admitting: Ophthalmology

## 2021-12-19 DIAGNOSIS — H353212 Exudative age-related macular degeneration, right eye, with inactive choroidal neovascularization: Secondary | ICD-10-CM | POA: Diagnosis not present

## 2021-12-19 DIAGNOSIS — H353221 Exudative age-related macular degeneration, left eye, with active choroidal neovascularization: Secondary | ICD-10-CM

## 2021-12-19 DIAGNOSIS — H353134 Nonexudative age-related macular degeneration, bilateral, advanced atrophic with subfoveal involvement: Secondary | ICD-10-CM

## 2021-12-19 MED ORDER — AFLIBERCEPT 2MG/0.05ML IZ SOLN FOR KALEIDOSCOPE
2.0000 mg | INTRAVITREAL | Status: AC | PRN
  Administered 2021-12-19: 2 mg via INTRAVITREAL

## 2021-12-19 NOTE — Assessment & Plan Note (Signed)
#  Today doing very well much less disease activity centrally.  At 6-week interval.  We will repeat injection Eylea today and extend interval to next 8 weeks

## 2021-12-19 NOTE — Assessment & Plan Note (Signed)
Extensive perifoveal atrophy OU

## 2021-12-19 NOTE — Assessment & Plan Note (Signed)
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

## 2021-12-19 NOTE — Progress Notes (Signed)
12/19/2021     CHIEF COMPLAINT Patient presents for  Chief Complaint  Patient presents with   Macular Degeneration      HISTORY OF PRESENT ILLNESS: Sylvia [name] is a 88 y.o. female who presents to the clinic today for:   HPI   6 weeks for DILATE OS, EYLEA OCT. Pt stated no changes in vision.  Last edited by Silvestre Moment on 12/19/2021  1:06 PM.      Referring physician: Donnajean Lopes, MD [place],  [place] [id number]  HISTORICAL INFORMATION:   Selected notes from the MEDICAL RECORD NUMBER       CURRENT MEDICATIONS: Current Outpatient Medications (Ophthalmic Drugs)  Medication Sig   hydroxypropyl methylcellulose / hypromellose (ISOPTO TEARS / GONIOVISC) 2.5 % ophthalmic solution Place 1 drop into both eyes as needed for dry eyes.   No current facility-administered medications for this visit. (Ophthalmic Drugs)   Current Outpatient Medications (Other)  Medication Sig   acetaminophen (TYLENOL) 325 MG tablet Take by mouth every 6 (six) hours as needed for mild pain.   amLODipine (NORVASC) 2.5 MG tablet TAKE 2 TABLETS BY MOUTH IN THE MORNING AND 1 IN THE EVENING   calcium citrate-vitamin D (CITRACAL+D) 315-200 MG-UNIT per tablet Take 1 tablet by mouth daily.   diclofenac sodium (VOLTAREN) 1 % GEL Apply 4 g topically 4 (four) times daily.   fluocinonide (LIDEX) 0.05 % external solution 2 (two) times daily. 1-2 GTTS IN EARS TWICE WEEKLY AS NEEDED    levothyroxine (SYNTHROID, LEVOTHROID) 75 MCG tablet Take 75 mcg by mouth daily.     losartan (COZAAR) 100 MG tablet Take 100 mg by mouth daily.     metoprolol tartrate (LOPRESSOR) 50 MG tablet Take 50 mg by mouth 2 (two) times daily.   Multiple Vitamins-Minerals (OCUVITE PRESERVISION PO) Take 1 tablet by mouth 2 (two) times daily.   pantoprazole (PROTONIX) 40 MG tablet Take 40 mg by mouth 2 (two) times daily.   polyethylene glycol powder (GLYCOLAX/MIRALAX) powder Take 17 g by mouth daily. PRN     predniSONE  (DELTASONE) 20 MG tablet    promethazine (PHENERGAN) 25 MG tablet Take 25 mg by mouth 3 (three) times daily as needed.   raloxifene (EVISTA) 60 MG tablet Take 60 mg by mouth daily.     sucralfate (CARAFATE) 1 GM/10ML suspension Take 10 mLs (1 g total) by mouth 4 (four) times daily -  with meals and at bedtime.   warfarin (COUMADIN) 5 MG tablet TAKE 1/2 TO 1 TABLET BY MOUTH ONCE DAILY AS DIRECTED BY COUMADIN CLINIC   No current facility-administered medications for this visit. (Other)      REVIEW OF SYSTEMS: ROS   Negative for: Constitutional, Gastrointestinal, Neurological, Skin, Genitourinary, Musculoskeletal, HENT, Endocrine, Cardiovascular, Eyes, Respiratory, Psychiatric, Allergic/Imm, Heme/Lymph Last edited by Silvestre Moment on 12/19/2021  1:06 PM.       ALLERGIES Allergies  Allergen Reactions   Metoclopramide Hcl     REACTION: nervous, climbs the wall   Penicillins     REACTION: hives   Pravastatin    Sulfonamide Derivatives     REACTION: rash    PAST MEDICAL HISTORY Past Medical History:  Diagnosis Date   Atrial fibrillation ([place])    ATRIAL FIBRILLATION S/P  PVI 6/11   CAD (coronary artery disease)    a. 03/2002: cath showing 20% OM1 stenosis, EF at 65%, no WMA. No significant CAD.    GERD (gastroesophageal reflux disease)    Hyperlipidemia  Hypertension    Macular degeneration    SVT (supraventricular tachycardia) (HCC)    Past Surgical History:  Procedure Laterality Date   ABLATION OF DYSRHYTHMIC FOCUS  09/2009   s/p PVI by JA   TOTAL ABDOMINAL HYSTERECTOMY      FAMILY HISTORY Family History  Problem Relation Age of Onset   Stroke Other    Coronary artery disease Other     SOCIAL HISTORY Social History   Tobacco Use   Smoking status: Never   Smokeless tobacco: Never  Vaping Use   Vaping Use: Never used  Substance Use Topics   Alcohol use: No   Drug use: No         OPHTHALMIC EXAM:  Base Eye Exam     Visual Acuity (ETDRS)       Right Left    Dist cc CF at 1' 20/50 -2 +1   Dist ph cc  NI    Correction: Glasses         Tonometry (Tonopen, 1:12 PM)       Right Left   Pressure 13 14         Pupils       Pupils APD   Right PERRL None   Left PERRL None         Visual Fields       Left Right    Full    Restrictions  Partial inner superior temporal, inferior temporal, superior nasal, inferior nasal deficiencies         Extraocular Movement       Right Left    Full, Ortho Full, Ortho         Neuro/Psych     Oriented x3: Yes   Mood/Affect: Normal         Dilation     Left eye: 2.5% Phenylephrine, 1.0% Mydriacyl @ 1:12 PM           Slit Lamp and Fundus Exam     External Exam       Right Left   External Normal Normal         Slit Lamp Exam       Right Left   Lids/Lashes Normal Normal   Conjunctiva/Sclera White and quiet White and quiet   Cornea Clear Clear   Anterior Chamber Deep and quiet Deep and quiet   Iris Round and reactive Round and reactive   Lens Posterior chamber intraocular lens Posterior chamber intraocular lens   Anterior Vitreous Normal Normal         Fundus Exam       Right Left   Posterior Vitreous  Posterior vitreous detachment   Disc  Normal   C/D Ratio  0.1   Macula  Age related macular degeneration, Atrophy into faz, advanced age related macular degeneration, Retinal pigment epithelial atrophy, Geographic atrophy in the FAZ,    Vessels  Normal   Periphery  Normal            IMAGING AND PROCEDURES  Imaging and Procedures for 12/19/21  OCT, Retina - OU - Both Eyes       Right Eye Quality was good. Scan locations included subfoveal. Central Foveal Thickness: 787. Progression has improved. Findings include abnormal foveal contour, retinal drusen , cystoid macular edema, disciform scar, intraretinal fluid.   Left Eye Quality was good. Scan locations included subfoveal. Central Foveal Thickness: 251. Progression has been stable. Findings  include no IRF, no SRF, abnormal foveal contour, intraretinal hyper-reflective material.  Notes At 6 week follow-up today, decreased CME  and decreased fluid and thickness as well.  Will need repeat Eylea today and examination again in 6-week, OS  OD with chronic subfoveal disciform scar, chronic CME intraretinal fluid, now with retinal hemorrhage enlargement.  Today at 5 months postinjection with no real change in the large chronic disciform scarring and intraretinal fluid. Stable     Intravitreal Injection, Pharmacologic Agent - OS - Left Eye       Time Out 12/19/2021. 1:49 PM. Confirmed correct patient, procedure, site, and patient consented.   Anesthesia Topical anesthesia was used. Anesthetic medications included Lidocaine 4%.   Procedure Preparation included 5% betadine to ocular surface, 10% betadine to eyelids, Tobramycin 0.3%, Ofloxacin . A 30 gauge needle was used.   Injection: 2 mg aflibercept 2 MG/0.05ML   Route: Intravitreal, Site: Left Eye   NDC: [id number], Waste: 0 mL   Post-op Post injection exam found visual acuity of at least counting fingers. The patient tolerated the procedure well. There were no complications. The patient received written and verbal post procedure care education. Post injection medications included ocuflox.   Notes OS vastly improved macular findings.  Stable macular findings at 6 weeks postinjection.  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  At 6-week interval.  We will repeat injection Eylea today and extend interval to next 8 weeks     ICD-10-CM   1. Exudative age-related macular degeneration of left eye with active choroidal neovascularization (HCC)  H35.3221 OCT, Retina - OU - Both Eyes    Intravitreal Injection, Pharmacologic Agent - OS - Left Eye    aflibercept (EYLEA) SOLN 2 mg    2. Advanced nonexudative age-related macular degeneration of both eyes with subfoveal involvement  H35.3134     3. Exudative age-related macular degeneration of right eye with inactive choroidal neovascularization ([place])  H35.3212       1.  OS much improved and stable acuity and stable and improved anatomy post injection Eylea, today at 6-week follow-up.  Repeat injection in follow-up next in 8 weeks  2.  Geographic atrophy OS, will continue to observe and extend interval examination next 8 weeks  3.  Ophthalmic Meds Ordered this visit:  Meds ordered this encounter  Medications   aflibercept (EYLEA) SOLN 2 mg       Return in about 8 weeks (around 02/13/2022) for dilate, OS, EYLEA OCT.  There are no Patient Instructions on file for this visit.   Explained the diagnoses, plan, and follow up with the patient and they expressed understanding.  Patient expressed understanding of the importance of proper follow up care.   Clent Demark Sheina Mcleish M.D. Diseases & Surgery of the Retina and Vitreous [facility] [place] 12/19/21     Abbreviations: M myopia (nearsighted); A astigmatism; H hyperopia (farsighted); P presbyopia; Mrx spectacle prescription;  CTL contact lenses; OD right eye; OS left eye; OU both eyes  XT exotropia; ET esotropia; PEK punctate epithelial keratitis; PEE punctate epithelial erosions; DES dry eye syndrome; MGD meibomian gland  dysfunction; ATs artificial tears; PFAT's preservative free artificial tears; [place] nuclear sclerotic cataract; PSC posterior subcapsular cataract; ERM epi-retinal membrane;  PVD posterior vitreous detachment; RD retinal detachment; DM diabetes mellitus; DR diabetic retinopathy; NPDR non-proliferative diabetic retinopathy; PDR proliferative diabetic retinopathy; CSME clinically significant macular edema; DME diabetic macular edema; dbh dot blot hemorrhages; CWS cotton wool spot; POAG primary open angle glaucoma; C/D cup-to-disc ratio; HVF humphrey visual field; GVF goldmann visual field; OCT optical coherence tomography; IOP intraocular pressure; BRVO Branch retinal vein occlusion; CRVO central retinal vein occlusion; CRAO central retinal artery occlusion; BRAO branch retinal artery occlusion; RT retinal tear; SB scleral buckle; PPV pars plana vitrectomy; VH Vitreous hemorrhage; PRP panretinal laser photocoagulation; IVK intravitreal kenalog; VMT vitreomacular traction; MH Macular hole;  NVD neovascularization of the disc; NVE neovascularization elsewhere; AREDS age related eye disease study; ARMD age related macular degeneration; POAG primary open angle glaucoma; EBMD epithelial/anterior basement membrane dystrophy; ACIOL anterior chamber intraocular lens; IOL intraocular lens; PCIOL posterior chamber intraocular lens; Phaco/IOL phacoemulsification with intraocular lens placement; [place] photorefractive keratectomy; LASIK laser assisted in situ keratomileusis; HTN hypertension; DM diabetes mellitus; COPD chronic obstructive pulmonary disease

## 2021-12-26 DIAGNOSIS — I10 Essential (primary) hypertension: Secondary | ICD-10-CM | POA: Diagnosis not present

## 2021-12-26 DIAGNOSIS — I4891 Unspecified atrial fibrillation: Secondary | ICD-10-CM | POA: Diagnosis not present

## 2021-12-26 DIAGNOSIS — D6859 Other primary thrombophilia: Secondary | ICD-10-CM | POA: Diagnosis not present

## 2021-12-26 DIAGNOSIS — Z7901 Long term (current) use of anticoagulants: Secondary | ICD-10-CM | POA: Diagnosis not present

## 2021-12-26 DIAGNOSIS — R609 Edema, unspecified: Secondary | ICD-10-CM | POA: Diagnosis not present

## 2021-12-30 DIAGNOSIS — N390 Urinary tract infection, site not specified: Secondary | ICD-10-CM | POA: Diagnosis not present

## 2022-01-06 DIAGNOSIS — R7989 Other specified abnormal findings of blood chemistry: Secondary | ICD-10-CM | POA: Diagnosis not present

## 2022-01-06 DIAGNOSIS — R5383 Other fatigue: Secondary | ICD-10-CM | POA: Diagnosis not present

## 2022-01-06 DIAGNOSIS — I10 Essential (primary) hypertension: Secondary | ICD-10-CM | POA: Diagnosis not present

## 2022-01-06 DIAGNOSIS — F419 Anxiety disorder, unspecified: Secondary | ICD-10-CM | POA: Diagnosis not present

## 2022-01-06 DIAGNOSIS — E039 Hypothyroidism, unspecified: Secondary | ICD-10-CM | POA: Diagnosis not present

## 2022-01-06 DIAGNOSIS — D649 Anemia, unspecified: Secondary | ICD-10-CM | POA: Diagnosis not present

## 2022-01-06 DIAGNOSIS — E785 Hyperlipidemia, unspecified: Secondary | ICD-10-CM | POA: Diagnosis not present

## 2022-01-13 DIAGNOSIS — I10 Essential (primary) hypertension: Secondary | ICD-10-CM | POA: Diagnosis not present

## 2022-01-13 DIAGNOSIS — G4733 Obstructive sleep apnea (adult) (pediatric): Secondary | ICD-10-CM | POA: Diagnosis not present

## 2022-01-13 DIAGNOSIS — I700 Atherosclerosis of aorta: Secondary | ICD-10-CM | POA: Diagnosis not present

## 2022-01-13 DIAGNOSIS — Z7901 Long term (current) use of anticoagulants: Secondary | ICD-10-CM | POA: Diagnosis not present

## 2022-01-13 DIAGNOSIS — R82998 Other abnormal findings in urine: Secondary | ICD-10-CM | POA: Diagnosis not present

## 2022-01-13 DIAGNOSIS — K5909 Other constipation: Secondary | ICD-10-CM | POA: Diagnosis not present

## 2022-01-13 DIAGNOSIS — H3530 Unspecified macular degeneration: Secondary | ICD-10-CM | POA: Diagnosis not present

## 2022-01-13 DIAGNOSIS — I482 Chronic atrial fibrillation, unspecified: Secondary | ICD-10-CM | POA: Diagnosis not present

## 2022-01-13 DIAGNOSIS — E785 Hyperlipidemia, unspecified: Secondary | ICD-10-CM | POA: Diagnosis not present

## 2022-01-13 DIAGNOSIS — F419 Anxiety disorder, unspecified: Secondary | ICD-10-CM | POA: Diagnosis not present

## 2022-01-13 DIAGNOSIS — E039 Hypothyroidism, unspecified: Secondary | ICD-10-CM | POA: Diagnosis not present

## 2022-01-13 DIAGNOSIS — Z23 Encounter for immunization: Secondary | ICD-10-CM | POA: Diagnosis not present

## 2022-01-13 DIAGNOSIS — Z0000 Encounter for general adult medical examination without abnormal findings: Secondary | ICD-10-CM | POA: Diagnosis not present

## 2022-01-16 DIAGNOSIS — R609 Edema, unspecified: Secondary | ICD-10-CM | POA: Diagnosis not present

## 2022-01-16 DIAGNOSIS — I10 Essential (primary) hypertension: Secondary | ICD-10-CM | POA: Diagnosis not present

## 2022-01-16 DIAGNOSIS — D6859 Other primary thrombophilia: Secondary | ICD-10-CM | POA: Diagnosis not present

## 2022-01-16 DIAGNOSIS — N390 Urinary tract infection, site not specified: Secondary | ICD-10-CM | POA: Diagnosis not present

## 2022-01-16 DIAGNOSIS — I4891 Unspecified atrial fibrillation: Secondary | ICD-10-CM | POA: Diagnosis not present

## 2022-01-16 DIAGNOSIS — Z7901 Long term (current) use of anticoagulants: Secondary | ICD-10-CM | POA: Diagnosis not present

## 2022-02-11 DIAGNOSIS — Z7901 Long term (current) use of anticoagulants: Secondary | ICD-10-CM | POA: Diagnosis not present

## 2022-02-11 DIAGNOSIS — D6859 Other primary thrombophilia: Secondary | ICD-10-CM | POA: Diagnosis not present

## 2022-02-11 DIAGNOSIS — I10 Essential (primary) hypertension: Secondary | ICD-10-CM | POA: Diagnosis not present

## 2022-02-11 DIAGNOSIS — I4891 Unspecified atrial fibrillation: Secondary | ICD-10-CM | POA: Diagnosis not present

## 2022-02-18 ENCOUNTER — Encounter (INDEPENDENT_AMBULATORY_CARE_PROVIDER_SITE_OTHER): Payer: Medicare Other | Admitting: Ophthalmology

## 2022-02-18 DIAGNOSIS — H353134 Nonexudative age-related macular degeneration, bilateral, advanced atrophic with subfoveal involvement: Secondary | ICD-10-CM | POA: Diagnosis not present

## 2022-02-18 DIAGNOSIS — H353221 Exudative age-related macular degeneration, left eye, with active choroidal neovascularization: Secondary | ICD-10-CM | POA: Diagnosis not present

## 2022-02-18 DIAGNOSIS — H353212 Exudative age-related macular degeneration, right eye, with inactive choroidal neovascularization: Secondary | ICD-10-CM | POA: Diagnosis not present

## 2022-02-18 DIAGNOSIS — H35351 Cystoid macular degeneration, right eye: Secondary | ICD-10-CM | POA: Diagnosis not present

## 2022-02-18 DIAGNOSIS — H43812 Vitreous degeneration, left eye: Secondary | ICD-10-CM | POA: Diagnosis not present

## 2022-02-20 DIAGNOSIS — H903 Sensorineural hearing loss, bilateral: Secondary | ICD-10-CM | POA: Diagnosis not present

## 2022-02-20 DIAGNOSIS — H6123 Impacted cerumen, bilateral: Secondary | ICD-10-CM | POA: Diagnosis not present

## 2022-03-04 ENCOUNTER — Encounter (INDEPENDENT_AMBULATORY_CARE_PROVIDER_SITE_OTHER): Payer: Medicare Other | Admitting: Ophthalmology

## 2022-03-11 DIAGNOSIS — Z7901 Long term (current) use of anticoagulants: Secondary | ICD-10-CM | POA: Diagnosis not present

## 2022-03-11 DIAGNOSIS — I10 Essential (primary) hypertension: Secondary | ICD-10-CM | POA: Diagnosis not present

## 2022-03-11 DIAGNOSIS — I4891 Unspecified atrial fibrillation: Secondary | ICD-10-CM | POA: Diagnosis not present

## 2022-03-11 DIAGNOSIS — D6859 Other primary thrombophilia: Secondary | ICD-10-CM | POA: Diagnosis not present

## 2022-04-08 DIAGNOSIS — G4733 Obstructive sleep apnea (adult) (pediatric): Secondary | ICD-10-CM | POA: Diagnosis not present

## 2022-04-08 DIAGNOSIS — H35351 Cystoid macular degeneration, right eye: Secondary | ICD-10-CM | POA: Diagnosis not present

## 2022-04-08 DIAGNOSIS — H353212 Exudative age-related macular degeneration, right eye, with inactive choroidal neovascularization: Secondary | ICD-10-CM | POA: Diagnosis not present

## 2022-04-08 DIAGNOSIS — H353134 Nonexudative age-related macular degeneration, bilateral, advanced atrophic with subfoveal involvement: Secondary | ICD-10-CM | POA: Diagnosis not present

## 2022-04-08 DIAGNOSIS — H353221 Exudative age-related macular degeneration, left eye, with active choroidal neovascularization: Secondary | ICD-10-CM | POA: Diagnosis not present

## 2022-04-08 DIAGNOSIS — H43812 Vitreous degeneration, left eye: Secondary | ICD-10-CM | POA: Diagnosis not present

## 2022-04-21 DIAGNOSIS — D6859 Other primary thrombophilia: Secondary | ICD-10-CM | POA: Diagnosis not present

## 2022-04-21 DIAGNOSIS — I4891 Unspecified atrial fibrillation: Secondary | ICD-10-CM | POA: Diagnosis not present

## 2022-04-21 DIAGNOSIS — Z7901 Long term (current) use of anticoagulants: Secondary | ICD-10-CM | POA: Diagnosis not present

## 2022-04-23 DIAGNOSIS — L820 Inflamed seborrheic keratosis: Secondary | ICD-10-CM | POA: Diagnosis not present

## 2022-05-19 DIAGNOSIS — R300 Dysuria: Secondary | ICD-10-CM | POA: Diagnosis not present

## 2022-05-19 DIAGNOSIS — D6859 Other primary thrombophilia: Secondary | ICD-10-CM | POA: Diagnosis not present

## 2022-05-19 DIAGNOSIS — I4891 Unspecified atrial fibrillation: Secondary | ICD-10-CM | POA: Diagnosis not present

## 2022-05-19 DIAGNOSIS — Z7901 Long term (current) use of anticoagulants: Secondary | ICD-10-CM | POA: Diagnosis not present

## 2022-05-27 DIAGNOSIS — H43812 Vitreous degeneration, left eye: Secondary | ICD-10-CM | POA: Diagnosis not present

## 2022-05-27 DIAGNOSIS — H353221 Exudative age-related macular degeneration, left eye, with active choroidal neovascularization: Secondary | ICD-10-CM | POA: Diagnosis not present

## 2022-05-27 DIAGNOSIS — H35351 Cystoid macular degeneration, right eye: Secondary | ICD-10-CM | POA: Diagnosis not present

## 2022-06-17 DIAGNOSIS — G479 Sleep disorder, unspecified: Secondary | ICD-10-CM | POA: Diagnosis not present

## 2022-06-17 DIAGNOSIS — D6859 Other primary thrombophilia: Secondary | ICD-10-CM | POA: Diagnosis not present

## 2022-06-17 DIAGNOSIS — I4891 Unspecified atrial fibrillation: Secondary | ICD-10-CM | POA: Diagnosis not present

## 2022-06-17 DIAGNOSIS — Z7901 Long term (current) use of anticoagulants: Secondary | ICD-10-CM | POA: Diagnosis not present

## 2022-07-17 DIAGNOSIS — H35351 Cystoid macular degeneration, right eye: Secondary | ICD-10-CM | POA: Diagnosis not present

## 2022-07-17 DIAGNOSIS — H43812 Vitreous degeneration, left eye: Secondary | ICD-10-CM | POA: Diagnosis not present

## 2022-07-17 DIAGNOSIS — H353221 Exudative age-related macular degeneration, left eye, with active choroidal neovascularization: Secondary | ICD-10-CM | POA: Diagnosis not present

## 2022-07-17 DIAGNOSIS — H353212 Exudative age-related macular degeneration, right eye, with inactive choroidal neovascularization: Secondary | ICD-10-CM | POA: Diagnosis not present

## 2022-07-17 DIAGNOSIS — H353134 Nonexudative age-related macular degeneration, bilateral, advanced atrophic with subfoveal involvement: Secondary | ICD-10-CM | POA: Diagnosis not present

## 2022-07-18 DIAGNOSIS — I4891 Unspecified atrial fibrillation: Secondary | ICD-10-CM | POA: Diagnosis not present

## 2022-07-18 DIAGNOSIS — D6859 Other primary thrombophilia: Secondary | ICD-10-CM | POA: Diagnosis not present

## 2022-07-18 DIAGNOSIS — G479 Sleep disorder, unspecified: Secondary | ICD-10-CM | POA: Diagnosis not present

## 2022-07-18 DIAGNOSIS — Z7901 Long term (current) use of anticoagulants: Secondary | ICD-10-CM | POA: Diagnosis not present

## 2022-08-18 DIAGNOSIS — I4891 Unspecified atrial fibrillation: Secondary | ICD-10-CM | POA: Diagnosis not present

## 2022-08-18 DIAGNOSIS — Z7901 Long term (current) use of anticoagulants: Secondary | ICD-10-CM | POA: Diagnosis not present

## 2022-08-18 DIAGNOSIS — D6859 Other primary thrombophilia: Secondary | ICD-10-CM | POA: Diagnosis not present

## 2022-09-11 DIAGNOSIS — G4733 Obstructive sleep apnea (adult) (pediatric): Secondary | ICD-10-CM | POA: Diagnosis not present

## 2022-09-11 DIAGNOSIS — H353212 Exudative age-related macular degeneration, right eye, with inactive choroidal neovascularization: Secondary | ICD-10-CM | POA: Diagnosis not present

## 2022-09-11 DIAGNOSIS — H353134 Nonexudative age-related macular degeneration, bilateral, advanced atrophic with subfoveal involvement: Secondary | ICD-10-CM | POA: Diagnosis not present

## 2022-09-11 DIAGNOSIS — H35351 Cystoid macular degeneration, right eye: Secondary | ICD-10-CM | POA: Diagnosis not present

## 2022-09-11 DIAGNOSIS — H353221 Exudative age-related macular degeneration, left eye, with active choroidal neovascularization: Secondary | ICD-10-CM | POA: Diagnosis not present

## 2022-09-11 DIAGNOSIS — H43812 Vitreous degeneration, left eye: Secondary | ICD-10-CM | POA: Diagnosis not present

## 2022-09-22 DIAGNOSIS — Z7901 Long term (current) use of anticoagulants: Secondary | ICD-10-CM | POA: Diagnosis not present

## 2022-09-22 DIAGNOSIS — D6859 Other primary thrombophilia: Secondary | ICD-10-CM | POA: Diagnosis not present

## 2022-09-22 DIAGNOSIS — R5383 Other fatigue: Secondary | ICD-10-CM | POA: Diagnosis not present

## 2022-09-22 DIAGNOSIS — I4891 Unspecified atrial fibrillation: Secondary | ICD-10-CM | POA: Diagnosis not present

## 2022-10-06 DIAGNOSIS — D6859 Other primary thrombophilia: Secondary | ICD-10-CM | POA: Diagnosis not present

## 2022-10-06 DIAGNOSIS — I4891 Unspecified atrial fibrillation: Secondary | ICD-10-CM | POA: Diagnosis not present

## 2022-10-06 DIAGNOSIS — Z7901 Long term (current) use of anticoagulants: Secondary | ICD-10-CM | POA: Diagnosis not present

## 2022-10-13 NOTE — Progress Notes (Signed)
HPI: FU permanent atrial fibrillation and SVT. Echocardiogram in June of 2011 showed normal LV function. There was trivial aortic and mitral regurgitation. Also note she had a cardiac catheterization in December 2003 that showed a 20% first obtuse marginal but otherwise no obstructive disease. She was referred for atrial fibrillation ablation. She had this procedure in June of 2011. Following the procedure she did have recurrent atrial fibrillation. She was seen by Dr. Johney Frame and consideration of repeat ablation was discussed. However the decision was made for rate control and anticoagulation. 24-hour Holter monitor in December 2015 showed rate was controlled. Nuclear study April 2018 showed ejection fraction 56% and no ischemia.  Echocardiogram January 2023 showed vigorous LV function, biatrial enlargement, mild mitral regurgitation, mild aortic insufficiency.  Since I last saw her she denies dyspnea, chest pain, palpitations, syncope or bleeding.  Current Outpatient Medications  Medication Sig Dispense Refill   acetaminophen (TYLENOL) 325 MG tablet Take by mouth every 6 (six) hours as needed for mild pain.     amLODipine (NORVASC) 2.5 MG tablet TAKE 2 TABLETS BY MOUTH IN THE MORNING AND 1 IN THE EVENING     calcium citrate-vitamin D (CITRACAL+D) 315-200 MG-UNIT per tablet Take 1 tablet by mouth daily.     diclofenac sodium (VOLTAREN) 1 % GEL Apply 4 g topically 4 (four) times daily. 5 Tube 3   fluocinonide (LIDEX) 0.05 % external solution 2 (two) times daily. 1-2 GTTS IN EARS TWICE WEEKLY AS NEEDED      hydroxypropyl methylcellulose / hypromellose (ISOPTO TEARS / GONIOVISC) 2.5 % ophthalmic solution Place 1 drop into both eyes as needed for dry eyes.     levothyroxine (SYNTHROID, LEVOTHROID) 75 MCG tablet Take 75 mcg by mouth daily.       losartan (COZAAR) 100 MG tablet Take 100 mg by mouth daily.       metoprolol tartrate (LOPRESSOR) 50 MG tablet Take 50 mg by mouth 2 (two) times daily.      Multiple Vitamins-Minerals (OCUVITE PRESERVISION PO) Take 1 tablet by mouth 2 (two) times daily.     pantoprazole (PROTONIX) 40 MG tablet Take 40 mg by mouth 2 (two) times daily.     polyethylene glycol powder (GLYCOLAX/MIRALAX) powder Take 17 g by mouth daily. PRN       predniSONE (DELTASONE) 20 MG tablet      promethazine (PHENERGAN) 25 MG tablet Take 25 mg by mouth 3 (three) times daily as needed.  0   warfarin (COUMADIN) 5 MG tablet TAKE 1/2 TO 1 TABLET BY MOUTH ONCE DAILY AS DIRECTED BY COUMADIN CLINIC 90 tablet 0   raloxifene (EVISTA) 60 MG tablet Take 60 mg by mouth daily.   (Patient not taking: Reported on 10/21/2022)     sucralfate (CARAFATE) 1 GM/10ML suspension Take 10 mLs (1 g total) by mouth 4 (four) times daily -  with meals and at bedtime. (Patient not taking: Reported on 10/21/2022) 420 mL 0   No current facility-administered medications for this visit.     Past Medical History:  Diagnosis Date   Atrial fibrillation (HCC)    ATRIAL FIBRILLATION S/P  PVI 6/11   CAD (coronary artery disease)    a. 03/2002: cath showing 20% OM1 stenosis, EF at 65%, no WMA. No significant CAD.    GERD (gastroesophageal reflux disease)    Hyperlipidemia    Hypertension    Macular degeneration    SVT (supraventricular tachycardia)     Past Surgical History:  Procedure Laterality  Date   ABLATION OF DYSRHYTHMIC FOCUS  09/2009   s/p PVI by JA   TOTAL ABDOMINAL HYSTERECTOMY      Social History   Socioeconomic History   Marital status: Married    Spouse name: Not on file   Number of children: Not on file   Years of education: Not on file   Highest education level: Not on file  Occupational History   Not on file  Tobacco Use   Smoking status: Never   Smokeless tobacco: Never  Vaping Use   Vaping Use: Never used  Substance and Sexual Activity   Alcohol use: No   Drug use: No   Sexual activity: Not on file  Other Topics Concern   Not on file  Social History Narrative   Lives with  spouse   Social Determinants of Health   Financial Resource Strain: Not on file  Food Insecurity: Not on file  Transportation Needs: Not on file  Physical Activity: Not on file  Stress: Not on file  Social Connections: Not on file  Intimate Partner Violence: Not on file    Family History  Problem Relation Age of Onset   Stroke Other    Coronary artery disease Other     ROS: no fevers or chills, productive cough, hemoptysis, dysphasia, odynophagia, melena, hematochezia, dysuria, hematuria, rash, seizure activity, orthopnea, PND, pedal edema, claudication. Remaining systems are negative.  Physical Exam: Well-developed well-nourished in no acute distress.  Skin is warm and dry.  HEENT is normal.  Neck is supple.  Chest is clear to auscultation with normal expansion.  Cardiovascular exam is irrregular Abdominal exam nontender or distended. No masses palpated. Extremities show no edema. neuro grossly intact  EKG Interpretation Date/Time:  Tuesday October 21 2022 09:00:19 EDT Ventricular Rate:  69 PR Interval:    QRS Duration:  86 QT Interval:  426 QTC Calculation: 456 R Axis:   10  Text Interpretation: Atrial fibrillation Cannot rule out Anterior infarct (cited on or before 17-Apr-2020) When compared with ECG of 17-Apr-2020 16:12, T wave inversion no longer evident in Inferior leads Nonspecific T wave abnormality no longer evident in Anterior leads Confirmed by Olga Millers ([id number]) on 10/21/2022 9:11:15 AM    A/P  1 permanent atrial fibrillation-continue metoprolol and Coumadin.  She declined DOACs previously.  Hemoglobin and renal function monitored by primary care.  2 hypertension-blood pressure mildly elevated and she states is elevated at home.  Increase amlodipine to 5 mg twice daily and follow-up.  3 history of SVT-she has had no recurrences based on history.  Continue beta-blocker.  Olga Millers, MD

## 2022-10-21 ENCOUNTER — Encounter: Payer: Self-pay | Admitting: Cardiology

## 2022-10-21 ENCOUNTER — Ambulatory Visit: Payer: Medicare Other | Admitting: Cardiology

## 2022-10-21 VITALS — BP 148/60 | HR 69 | Ht 61.0 in | Wt 147.0 lb

## 2022-10-21 DIAGNOSIS — I471 Supraventricular tachycardia, unspecified: Secondary | ICD-10-CM | POA: Insufficient documentation

## 2022-10-21 DIAGNOSIS — I4821 Permanent atrial fibrillation: Secondary | ICD-10-CM | POA: Insufficient documentation

## 2022-10-21 DIAGNOSIS — I10 Essential (primary) hypertension: Secondary | ICD-10-CM | POA: Insufficient documentation

## 2022-10-21 MED ORDER — AMLODIPINE BESYLATE 5 MG PO TABS: 5.0000 mg | ORAL_TABLET | Freq: Two times a day (BID) | ORAL | 3 refills | Status: DC

## 2022-10-21 NOTE — Patient Instructions (Addendum)
Medication Instructions:   INCREASE AMLODIPINE TO 5 MG TWICE DAILY  *If you need a refill on your cardiac medications before your next appointment, please call your pharmacy*   Follow-Up: At [facility], you and your health needs are our priority.  As part of our continuing mission to provide you with exceptional heart care, we have created designated Provider Care Teams.  These Care Teams include your primary Cardiologist (physician) and Advanced Practice Providers (APPs -  Physician Assistants and Nurse Practitioners) who all work together to provide you with the care you need, when you need it.  We recommend signing up for the patient portal called "MyChart".  Sign up information is provided on this After Visit Summary.  MyChart is used to connect with patients for Virtual Visits (Telemedicine).  Patients are able to view lab/test results, encounter notes, upcoming appointments, etc.  Non-urgent messages can be sent to your provider as well.   To learn more about what you can do with MyChart, go to [email or web address].    Your next appointment:   12 month(s)  Provider:   Olga Millers, MD

## 2022-11-04 DIAGNOSIS — D6859 Other primary thrombophilia: Secondary | ICD-10-CM | POA: Diagnosis not present

## 2022-11-04 DIAGNOSIS — I4891 Unspecified atrial fibrillation: Secondary | ICD-10-CM | POA: Diagnosis not present

## 2022-11-04 DIAGNOSIS — E039 Hypothyroidism, unspecified: Secondary | ICD-10-CM | POA: Diagnosis not present

## 2022-11-04 DIAGNOSIS — R42 Dizziness and giddiness: Secondary | ICD-10-CM | POA: Diagnosis not present

## 2022-11-04 DIAGNOSIS — Z7901 Long term (current) use of anticoagulants: Secondary | ICD-10-CM | POA: Diagnosis not present

## 2022-11-04 DIAGNOSIS — I129 Hypertensive chronic kidney disease with stage 1 through stage 4 chronic kidney disease, or unspecified chronic kidney disease: Secondary | ICD-10-CM | POA: Diagnosis not present

## 2022-11-05 DIAGNOSIS — N390 Urinary tract infection, site not specified: Secondary | ICD-10-CM | POA: Diagnosis not present

## 2022-11-17 DIAGNOSIS — D6859 Other primary thrombophilia: Secondary | ICD-10-CM | POA: Diagnosis not present

## 2022-11-17 DIAGNOSIS — Z7901 Long term (current) use of anticoagulants: Secondary | ICD-10-CM | POA: Diagnosis not present

## 2022-11-17 DIAGNOSIS — I129 Hypertensive chronic kidney disease with stage 1 through stage 4 chronic kidney disease, or unspecified chronic kidney disease: Secondary | ICD-10-CM | POA: Diagnosis not present

## 2022-11-17 DIAGNOSIS — K5909 Other constipation: Secondary | ICD-10-CM | POA: Diagnosis not present

## 2022-11-17 DIAGNOSIS — I4891 Unspecified atrial fibrillation: Secondary | ICD-10-CM | POA: Diagnosis not present

## 2022-11-17 DIAGNOSIS — N390 Urinary tract infection, site not specified: Secondary | ICD-10-CM | POA: Diagnosis not present

## 2022-11-17 DIAGNOSIS — E039 Hypothyroidism, unspecified: Secondary | ICD-10-CM | POA: Diagnosis not present

## 2022-11-17 DIAGNOSIS — R42 Dizziness and giddiness: Secondary | ICD-10-CM | POA: Diagnosis not present

## 2022-11-17 DIAGNOSIS — H6123 Impacted cerumen, bilateral: Secondary | ICD-10-CM | POA: Diagnosis not present

## 2022-11-18 DIAGNOSIS — H35351 Cystoid macular degeneration, right eye: Secondary | ICD-10-CM | POA: Diagnosis not present

## 2022-11-18 DIAGNOSIS — H43812 Vitreous degeneration, left eye: Secondary | ICD-10-CM | POA: Diagnosis not present

## 2022-11-18 DIAGNOSIS — H353134 Nonexudative age-related macular degeneration, bilateral, advanced atrophic with subfoveal involvement: Secondary | ICD-10-CM | POA: Diagnosis not present

## 2022-11-18 DIAGNOSIS — H353212 Exudative age-related macular degeneration, right eye, with inactive choroidal neovascularization: Secondary | ICD-10-CM | POA: Diagnosis not present

## 2022-11-18 DIAGNOSIS — H353221 Exudative age-related macular degeneration, left eye, with active choroidal neovascularization: Secondary | ICD-10-CM | POA: Diagnosis not present

## 2022-12-22 DIAGNOSIS — H6123 Impacted cerumen, bilateral: Secondary | ICD-10-CM | POA: Diagnosis not present

## 2022-12-22 DIAGNOSIS — H903 Sensorineural hearing loss, bilateral: Secondary | ICD-10-CM | POA: Diagnosis not present

## 2022-12-25 DIAGNOSIS — I4891 Unspecified atrial fibrillation: Secondary | ICD-10-CM | POA: Diagnosis not present

## 2022-12-25 DIAGNOSIS — D6859 Other primary thrombophilia: Secondary | ICD-10-CM | POA: Diagnosis not present

## 2022-12-25 DIAGNOSIS — G4700 Insomnia, unspecified: Secondary | ICD-10-CM | POA: Diagnosis not present

## 2022-12-25 DIAGNOSIS — Z7901 Long term (current) use of anticoagulants: Secondary | ICD-10-CM | POA: Diagnosis not present

## 2022-12-30 DIAGNOSIS — H43812 Vitreous degeneration, left eye: Secondary | ICD-10-CM | POA: Diagnosis not present

## 2022-12-30 DIAGNOSIS — H353221 Exudative age-related macular degeneration, left eye, with active choroidal neovascularization: Secondary | ICD-10-CM | POA: Diagnosis not present

## 2022-12-30 DIAGNOSIS — H353134 Nonexudative age-related macular degeneration, bilateral, advanced atrophic with subfoveal involvement: Secondary | ICD-10-CM | POA: Diagnosis not present

## 2022-12-30 DIAGNOSIS — H353212 Exudative age-related macular degeneration, right eye, with inactive choroidal neovascularization: Secondary | ICD-10-CM | POA: Diagnosis not present

## 2022-12-30 DIAGNOSIS — H35351 Cystoid macular degeneration, right eye: Secondary | ICD-10-CM | POA: Diagnosis not present

## 2023-01-22 DIAGNOSIS — Z7901 Long term (current) use of anticoagulants: Secondary | ICD-10-CM | POA: Diagnosis not present

## 2023-01-22 DIAGNOSIS — D6859 Other primary thrombophilia: Secondary | ICD-10-CM | POA: Diagnosis not present

## 2023-01-22 DIAGNOSIS — I4891 Unspecified atrial fibrillation: Secondary | ICD-10-CM | POA: Diagnosis not present

## 2023-01-22 DIAGNOSIS — G4700 Insomnia, unspecified: Secondary | ICD-10-CM | POA: Diagnosis not present

## 2023-02-05 DIAGNOSIS — D6859 Other primary thrombophilia: Secondary | ICD-10-CM | POA: Diagnosis not present

## 2023-02-05 DIAGNOSIS — I4891 Unspecified atrial fibrillation: Secondary | ICD-10-CM | POA: Diagnosis not present

## 2023-02-05 DIAGNOSIS — G4700 Insomnia, unspecified: Secondary | ICD-10-CM | POA: Diagnosis not present

## 2023-02-05 DIAGNOSIS — Z7901 Long term (current) use of anticoagulants: Secondary | ICD-10-CM | POA: Diagnosis not present

## 2023-02-05 DIAGNOSIS — F419 Anxiety disorder, unspecified: Secondary | ICD-10-CM | POA: Diagnosis not present

## 2023-02-11 DIAGNOSIS — N390 Urinary tract infection, site not specified: Secondary | ICD-10-CM | POA: Diagnosis not present

## 2023-02-11 DIAGNOSIS — Z87440 Personal history of urinary (tract) infections: Secondary | ICD-10-CM | POA: Diagnosis not present

## 2023-02-12 DIAGNOSIS — I10 Essential (primary) hypertension: Secondary | ICD-10-CM | POA: Diagnosis not present

## 2023-02-12 DIAGNOSIS — D649 Anemia, unspecified: Secondary | ICD-10-CM | POA: Diagnosis not present

## 2023-02-12 DIAGNOSIS — I700 Atherosclerosis of aorta: Secondary | ICD-10-CM | POA: Diagnosis not present

## 2023-02-12 DIAGNOSIS — I4891 Unspecified atrial fibrillation: Secondary | ICD-10-CM | POA: Diagnosis not present

## 2023-02-12 DIAGNOSIS — R350 Frequency of micturition: Secondary | ICD-10-CM | POA: Diagnosis not present

## 2023-02-12 DIAGNOSIS — R5383 Other fatigue: Secondary | ICD-10-CM | POA: Diagnosis not present

## 2023-02-12 DIAGNOSIS — D6859 Other primary thrombophilia: Secondary | ICD-10-CM | POA: Diagnosis not present

## 2023-02-12 DIAGNOSIS — Z7901 Long term (current) use of anticoagulants: Secondary | ICD-10-CM | POA: Diagnosis not present

## 2023-02-12 DIAGNOSIS — F419 Anxiety disorder, unspecified: Secondary | ICD-10-CM | POA: Diagnosis not present

## 2023-02-12 DIAGNOSIS — G4700 Insomnia, unspecified: Secondary | ICD-10-CM | POA: Diagnosis not present

## 2023-02-12 DIAGNOSIS — E039 Hypothyroidism, unspecified: Secondary | ICD-10-CM | POA: Diagnosis not present

## 2023-02-24 DIAGNOSIS — H353212 Exudative age-related macular degeneration, right eye, with inactive choroidal neovascularization: Secondary | ICD-10-CM | POA: Diagnosis not present

## 2023-02-24 DIAGNOSIS — H353134 Nonexudative age-related macular degeneration, bilateral, advanced atrophic with subfoveal involvement: Secondary | ICD-10-CM | POA: Diagnosis not present

## 2023-02-24 DIAGNOSIS — H43812 Vitreous degeneration, left eye: Secondary | ICD-10-CM | POA: Diagnosis not present

## 2023-02-24 DIAGNOSIS — H353221 Exudative age-related macular degeneration, left eye, with active choroidal neovascularization: Secondary | ICD-10-CM | POA: Diagnosis not present

## 2023-02-24 DIAGNOSIS — H35351 Cystoid macular degeneration, right eye: Secondary | ICD-10-CM | POA: Diagnosis not present

## 2023-03-03 DIAGNOSIS — Z7901 Long term (current) use of anticoagulants: Secondary | ICD-10-CM | POA: Diagnosis not present

## 2023-03-03 DIAGNOSIS — I4891 Unspecified atrial fibrillation: Secondary | ICD-10-CM | POA: Diagnosis not present

## 2023-03-03 DIAGNOSIS — F419 Anxiety disorder, unspecified: Secondary | ICD-10-CM | POA: Diagnosis not present

## 2023-03-03 DIAGNOSIS — Z23 Encounter for immunization: Secondary | ICD-10-CM | POA: Diagnosis not present

## 2023-03-03 DIAGNOSIS — G4700 Insomnia, unspecified: Secondary | ICD-10-CM | POA: Diagnosis not present

## 2023-03-03 DIAGNOSIS — D6859 Other primary thrombophilia: Secondary | ICD-10-CM | POA: Diagnosis not present

## 2023-03-16 DIAGNOSIS — E785 Hyperlipidemia, unspecified: Secondary | ICD-10-CM | POA: Diagnosis not present

## 2023-03-16 DIAGNOSIS — M8580 Other specified disorders of bone density and structure, unspecified site: Secondary | ICD-10-CM | POA: Diagnosis not present

## 2023-03-16 DIAGNOSIS — I10 Essential (primary) hypertension: Secondary | ICD-10-CM | POA: Diagnosis not present

## 2023-03-16 DIAGNOSIS — E039 Hypothyroidism, unspecified: Secondary | ICD-10-CM | POA: Diagnosis not present

## 2023-03-16 DIAGNOSIS — D649 Anemia, unspecified: Secondary | ICD-10-CM | POA: Diagnosis not present

## 2023-03-23 DIAGNOSIS — I4891 Unspecified atrial fibrillation: Secondary | ICD-10-CM | POA: Diagnosis not present

## 2023-03-23 DIAGNOSIS — E785 Hyperlipidemia, unspecified: Secondary | ICD-10-CM | POA: Diagnosis not present

## 2023-03-23 DIAGNOSIS — E039 Hypothyroidism, unspecified: Secondary | ICD-10-CM | POA: Diagnosis not present

## 2023-03-23 DIAGNOSIS — H3530 Unspecified macular degeneration: Secondary | ICD-10-CM | POA: Diagnosis not present

## 2023-03-23 DIAGNOSIS — Z0000 Encounter for general adult medical examination without abnormal findings: Secondary | ICD-10-CM | POA: Diagnosis not present

## 2023-03-23 DIAGNOSIS — I10 Essential (primary) hypertension: Secondary | ICD-10-CM | POA: Diagnosis not present

## 2023-03-23 DIAGNOSIS — D6859 Other primary thrombophilia: Secondary | ICD-10-CM | POA: Diagnosis not present

## 2023-03-23 DIAGNOSIS — R82998 Other abnormal findings in urine: Secondary | ICD-10-CM | POA: Diagnosis not present

## 2023-03-23 DIAGNOSIS — G4733 Obstructive sleep apnea (adult) (pediatric): Secondary | ICD-10-CM | POA: Diagnosis not present

## 2023-03-23 DIAGNOSIS — D649 Anemia, unspecified: Secondary | ICD-10-CM | POA: Diagnosis not present

## 2023-03-31 DIAGNOSIS — I4891 Unspecified atrial fibrillation: Secondary | ICD-10-CM | POA: Diagnosis not present

## 2023-03-31 DIAGNOSIS — F419 Anxiety disorder, unspecified: Secondary | ICD-10-CM | POA: Diagnosis not present

## 2023-03-31 DIAGNOSIS — D6859 Other primary thrombophilia: Secondary | ICD-10-CM | POA: Diagnosis not present

## 2023-03-31 DIAGNOSIS — G4700 Insomnia, unspecified: Secondary | ICD-10-CM | POA: Diagnosis not present

## 2023-03-31 DIAGNOSIS — Z7901 Long term (current) use of anticoagulants: Secondary | ICD-10-CM | POA: Diagnosis not present

## 2023-04-28 DIAGNOSIS — H43812 Vitreous degeneration, left eye: Secondary | ICD-10-CM | POA: Diagnosis not present

## 2023-04-28 DIAGNOSIS — H353221 Exudative age-related macular degeneration, left eye, with active choroidal neovascularization: Secondary | ICD-10-CM | POA: Diagnosis not present

## 2023-04-28 DIAGNOSIS — H35351 Cystoid macular degeneration, right eye: Secondary | ICD-10-CM | POA: Diagnosis not present

## 2023-04-28 DIAGNOSIS — H353212 Exudative age-related macular degeneration, right eye, with inactive choroidal neovascularization: Secondary | ICD-10-CM | POA: Diagnosis not present

## 2023-04-28 DIAGNOSIS — H353134 Nonexudative age-related macular degeneration, bilateral, advanced atrophic with subfoveal involvement: Secondary | ICD-10-CM | POA: Diagnosis not present

## 2023-04-30 DIAGNOSIS — D6859 Other primary thrombophilia: Secondary | ICD-10-CM | POA: Diagnosis not present

## 2023-04-30 DIAGNOSIS — Z7901 Long term (current) use of anticoagulants: Secondary | ICD-10-CM | POA: Diagnosis not present

## 2023-04-30 DIAGNOSIS — I4891 Unspecified atrial fibrillation: Secondary | ICD-10-CM | POA: Diagnosis not present

## 2023-05-20 DIAGNOSIS — D649 Anemia, unspecified: Secondary | ICD-10-CM | POA: Diagnosis not present

## 2023-06-02 DIAGNOSIS — I4891 Unspecified atrial fibrillation: Secondary | ICD-10-CM | POA: Diagnosis not present

## 2023-06-02 DIAGNOSIS — D6859 Other primary thrombophilia: Secondary | ICD-10-CM | POA: Diagnosis not present

## 2023-06-02 DIAGNOSIS — Z7901 Long term (current) use of anticoagulants: Secondary | ICD-10-CM | POA: Diagnosis not present

## 2023-06-29 DIAGNOSIS — D6859 Other primary thrombophilia: Secondary | ICD-10-CM | POA: Diagnosis not present

## 2023-06-29 DIAGNOSIS — Z7901 Long term (current) use of anticoagulants: Secondary | ICD-10-CM | POA: Diagnosis not present

## 2023-06-29 DIAGNOSIS — I4891 Unspecified atrial fibrillation: Secondary | ICD-10-CM | POA: Diagnosis not present

## 2023-06-30 DIAGNOSIS — H353221 Exudative age-related macular degeneration, left eye, with active choroidal neovascularization: Secondary | ICD-10-CM | POA: Diagnosis not present

## 2023-06-30 DIAGNOSIS — H353134 Nonexudative age-related macular degeneration, bilateral, advanced atrophic with subfoveal involvement: Secondary | ICD-10-CM | POA: Diagnosis not present

## 2023-06-30 DIAGNOSIS — H353212 Exudative age-related macular degeneration, right eye, with inactive choroidal neovascularization: Secondary | ICD-10-CM | POA: Diagnosis not present

## 2023-06-30 DIAGNOSIS — H35351 Cystoid macular degeneration, right eye: Secondary | ICD-10-CM | POA: Diagnosis not present

## 2023-06-30 DIAGNOSIS — H43812 Vitreous degeneration, left eye: Secondary | ICD-10-CM | POA: Diagnosis not present

## 2023-07-27 DIAGNOSIS — Z7901 Long term (current) use of anticoagulants: Secondary | ICD-10-CM | POA: Diagnosis not present

## 2023-07-27 DIAGNOSIS — D6859 Other primary thrombophilia: Secondary | ICD-10-CM | POA: Diagnosis not present

## 2023-07-27 DIAGNOSIS — I4891 Unspecified atrial fibrillation: Secondary | ICD-10-CM | POA: Diagnosis not present

## 2023-08-25 DIAGNOSIS — I4891 Unspecified atrial fibrillation: Secondary | ICD-10-CM | POA: Diagnosis not present

## 2023-08-25 DIAGNOSIS — F419 Anxiety disorder, unspecified: Secondary | ICD-10-CM | POA: Diagnosis not present

## 2023-08-25 DIAGNOSIS — D6859 Other primary thrombophilia: Secondary | ICD-10-CM | POA: Diagnosis not present

## 2023-08-25 DIAGNOSIS — Z7901 Long term (current) use of anticoagulants: Secondary | ICD-10-CM | POA: Diagnosis not present

## 2023-08-25 DIAGNOSIS — G4700 Insomnia, unspecified: Secondary | ICD-10-CM | POA: Diagnosis not present

## 2023-09-03 DIAGNOSIS — D6859 Other primary thrombophilia: Secondary | ICD-10-CM | POA: Diagnosis not present

## 2023-09-03 DIAGNOSIS — R634 Abnormal weight loss: Secondary | ICD-10-CM | POA: Diagnosis not present

## 2023-09-03 DIAGNOSIS — G4700 Insomnia, unspecified: Secondary | ICD-10-CM | POA: Diagnosis not present

## 2023-09-03 DIAGNOSIS — F419 Anxiety disorder, unspecified: Secondary | ICD-10-CM | POA: Diagnosis not present

## 2023-09-03 DIAGNOSIS — I4891 Unspecified atrial fibrillation: Secondary | ICD-10-CM | POA: Diagnosis not present

## 2023-09-03 DIAGNOSIS — Z7901 Long term (current) use of anticoagulants: Secondary | ICD-10-CM | POA: Diagnosis not present

## 2023-09-14 DIAGNOSIS — H43812 Vitreous degeneration, left eye: Secondary | ICD-10-CM | POA: Diagnosis not present

## 2023-09-14 DIAGNOSIS — H353221 Exudative age-related macular degeneration, left eye, with active choroidal neovascularization: Secondary | ICD-10-CM | POA: Diagnosis not present

## 2023-09-14 DIAGNOSIS — H35351 Cystoid macular degeneration, right eye: Secondary | ICD-10-CM | POA: Diagnosis not present

## 2023-09-14 DIAGNOSIS — H353212 Exudative age-related macular degeneration, right eye, with inactive choroidal neovascularization: Secondary | ICD-10-CM | POA: Diagnosis not present

## 2023-09-14 DIAGNOSIS — H353134 Nonexudative age-related macular degeneration, bilateral, advanced atrophic with subfoveal involvement: Secondary | ICD-10-CM | POA: Diagnosis not present

## 2023-09-24 DIAGNOSIS — F419 Anxiety disorder, unspecified: Secondary | ICD-10-CM | POA: Diagnosis not present

## 2023-09-24 DIAGNOSIS — R634 Abnormal weight loss: Secondary | ICD-10-CM | POA: Diagnosis not present

## 2023-09-24 DIAGNOSIS — G4700 Insomnia, unspecified: Secondary | ICD-10-CM | POA: Diagnosis not present

## 2023-09-24 DIAGNOSIS — Z7901 Long term (current) use of anticoagulants: Secondary | ICD-10-CM | POA: Diagnosis not present

## 2023-09-24 DIAGNOSIS — R350 Frequency of micturition: Secondary | ICD-10-CM | POA: Diagnosis not present

## 2023-09-24 DIAGNOSIS — D6859 Other primary thrombophilia: Secondary | ICD-10-CM | POA: Diagnosis not present

## 2023-09-24 DIAGNOSIS — N390 Urinary tract infection, site not specified: Secondary | ICD-10-CM | POA: Diagnosis not present

## 2023-09-24 DIAGNOSIS — I4891 Unspecified atrial fibrillation: Secondary | ICD-10-CM | POA: Diagnosis not present

## 2023-10-20 DIAGNOSIS — I4891 Unspecified atrial fibrillation: Secondary | ICD-10-CM | POA: Diagnosis not present

## 2023-10-20 DIAGNOSIS — R634 Abnormal weight loss: Secondary | ICD-10-CM | POA: Diagnosis not present

## 2023-10-20 DIAGNOSIS — Z7901 Long term (current) use of anticoagulants: Secondary | ICD-10-CM | POA: Diagnosis not present

## 2023-10-20 DIAGNOSIS — G4700 Insomnia, unspecified: Secondary | ICD-10-CM | POA: Diagnosis not present

## 2023-10-20 DIAGNOSIS — D6859 Other primary thrombophilia: Secondary | ICD-10-CM | POA: Diagnosis not present

## 2023-10-20 DIAGNOSIS — F419 Anxiety disorder, unspecified: Secondary | ICD-10-CM | POA: Diagnosis not present

## 2023-10-20 DIAGNOSIS — H9193 Unspecified hearing loss, bilateral: Secondary | ICD-10-CM | POA: Diagnosis not present

## 2023-10-20 DIAGNOSIS — H6121 Impacted cerumen, right ear: Secondary | ICD-10-CM | POA: Diagnosis not present

## 2023-10-27 DIAGNOSIS — H353221 Exudative age-related macular degeneration, left eye, with active choroidal neovascularization: Secondary | ICD-10-CM | POA: Diagnosis not present

## 2023-10-27 DIAGNOSIS — H35351 Cystoid macular degeneration, right eye: Secondary | ICD-10-CM | POA: Diagnosis not present

## 2023-10-27 DIAGNOSIS — H353212 Exudative age-related macular degeneration, right eye, with inactive choroidal neovascularization: Secondary | ICD-10-CM | POA: Diagnosis not present

## 2023-10-27 DIAGNOSIS — H353134 Nonexudative age-related macular degeneration, bilateral, advanced atrophic with subfoveal involvement: Secondary | ICD-10-CM | POA: Diagnosis not present

## 2023-10-27 DIAGNOSIS — H43812 Vitreous degeneration, left eye: Secondary | ICD-10-CM | POA: Diagnosis not present

## 2023-10-28 DIAGNOSIS — Z974 Presence of external hearing-aid: Secondary | ICD-10-CM | POA: Diagnosis not present

## 2023-10-28 DIAGNOSIS — H903 Sensorineural hearing loss, bilateral: Secondary | ICD-10-CM | POA: Diagnosis not present

## 2023-10-28 DIAGNOSIS — H6121 Impacted cerumen, right ear: Secondary | ICD-10-CM | POA: Diagnosis not present

## 2023-10-30 DIAGNOSIS — I4891 Unspecified atrial fibrillation: Secondary | ICD-10-CM | POA: Diagnosis not present

## 2023-10-30 DIAGNOSIS — G4700 Insomnia, unspecified: Secondary | ICD-10-CM | POA: Diagnosis not present

## 2023-10-30 DIAGNOSIS — Z7901 Long term (current) use of anticoagulants: Secondary | ICD-10-CM | POA: Diagnosis not present

## 2023-10-30 DIAGNOSIS — D6859 Other primary thrombophilia: Secondary | ICD-10-CM | POA: Diagnosis not present

## 2023-10-30 DIAGNOSIS — F419 Anxiety disorder, unspecified: Secondary | ICD-10-CM | POA: Diagnosis not present

## 2023-10-30 DIAGNOSIS — R634 Abnormal weight loss: Secondary | ICD-10-CM | POA: Diagnosis not present

## 2023-10-31 ENCOUNTER — Other Ambulatory Visit: Payer: Self-pay | Admitting: Cardiology

## 2023-11-03 ENCOUNTER — Other Ambulatory Visit: Payer: Self-pay | Admitting: Cardiology

## 2023-12-01 DIAGNOSIS — R634 Abnormal weight loss: Secondary | ICD-10-CM | POA: Diagnosis not present

## 2023-12-01 DIAGNOSIS — Z7901 Long term (current) use of anticoagulants: Secondary | ICD-10-CM | POA: Diagnosis not present

## 2023-12-01 DIAGNOSIS — I4891 Unspecified atrial fibrillation: Secondary | ICD-10-CM | POA: Diagnosis not present

## 2023-12-01 DIAGNOSIS — F419 Anxiety disorder, unspecified: Secondary | ICD-10-CM | POA: Diagnosis not present

## 2023-12-01 DIAGNOSIS — G4700 Insomnia, unspecified: Secondary | ICD-10-CM | POA: Diagnosis not present

## 2023-12-01 DIAGNOSIS — D6859 Other primary thrombophilia: Secondary | ICD-10-CM | POA: Diagnosis not present

## 2023-12-08 DIAGNOSIS — H43812 Vitreous degeneration, left eye: Secondary | ICD-10-CM | POA: Diagnosis not present

## 2023-12-08 DIAGNOSIS — H35351 Cystoid macular degeneration, right eye: Secondary | ICD-10-CM | POA: Diagnosis not present

## 2023-12-08 DIAGNOSIS — H353134 Nonexudative age-related macular degeneration, bilateral, advanced atrophic with subfoveal involvement: Secondary | ICD-10-CM | POA: Diagnosis not present

## 2023-12-08 DIAGNOSIS — H353212 Exudative age-related macular degeneration, right eye, with inactive choroidal neovascularization: Secondary | ICD-10-CM | POA: Diagnosis not present

## 2023-12-08 DIAGNOSIS — H353221 Exudative age-related macular degeneration, left eye, with active choroidal neovascularization: Secondary | ICD-10-CM | POA: Diagnosis not present

## 2023-12-16 DIAGNOSIS — L218 Other seborrheic dermatitis: Secondary | ICD-10-CM | POA: Diagnosis not present

## 2023-12-16 DIAGNOSIS — L980 Pyogenic granuloma: Secondary | ICD-10-CM | POA: Diagnosis not present

## 2023-12-16 DIAGNOSIS — L308 Other specified dermatitis: Secondary | ICD-10-CM | POA: Diagnosis not present

## 2024-01-07 DIAGNOSIS — D6859 Other primary thrombophilia: Secondary | ICD-10-CM | POA: Diagnosis not present

## 2024-01-07 DIAGNOSIS — Z7901 Long term (current) use of anticoagulants: Secondary | ICD-10-CM | POA: Diagnosis not present

## 2024-01-07 DIAGNOSIS — R634 Abnormal weight loss: Secondary | ICD-10-CM | POA: Diagnosis not present

## 2024-01-07 DIAGNOSIS — I4891 Unspecified atrial fibrillation: Secondary | ICD-10-CM | POA: Diagnosis not present

## 2024-01-19 DIAGNOSIS — H353134 Nonexudative age-related macular degeneration, bilateral, advanced atrophic with subfoveal involvement: Secondary | ICD-10-CM | POA: Diagnosis not present

## 2024-01-19 DIAGNOSIS — H35351 Cystoid macular degeneration, right eye: Secondary | ICD-10-CM | POA: Diagnosis not present

## 2024-01-19 DIAGNOSIS — H43812 Vitreous degeneration, left eye: Secondary | ICD-10-CM | POA: Diagnosis not present

## 2024-01-19 DIAGNOSIS — H353221 Exudative age-related macular degeneration, left eye, with active choroidal neovascularization: Secondary | ICD-10-CM | POA: Diagnosis not present

## 2024-01-19 DIAGNOSIS — H353212 Exudative age-related macular degeneration, right eye, with inactive choroidal neovascularization: Secondary | ICD-10-CM | POA: Diagnosis not present

## 2024-02-02 DIAGNOSIS — D6859 Other primary thrombophilia: Secondary | ICD-10-CM | POA: Diagnosis not present

## 2024-02-02 DIAGNOSIS — R634 Abnormal weight loss: Secondary | ICD-10-CM | POA: Diagnosis not present

## 2024-02-02 DIAGNOSIS — I4891 Unspecified atrial fibrillation: Secondary | ICD-10-CM | POA: Diagnosis not present

## 2024-02-02 DIAGNOSIS — Z7901 Long term (current) use of anticoagulants: Secondary | ICD-10-CM | POA: Diagnosis not present

## 2024-02-17 ENCOUNTER — Telehealth: Payer: Self-pay | Admitting: Cardiology

## 2024-02-17 MED ORDER — AMLODIPINE BESYLATE 5 MG PO TABS: 5.0000 mg | ORAL_TABLET | Freq: Two times a day (BID) | ORAL | 0 refills | Status: DC

## 2024-02-17 NOTE — Telephone Encounter (Signed)
 Pt's medication was sent to pt's pharmacy as requested. Confirmation received.

## 2024-02-17 NOTE — Telephone Encounter (Signed)
*  STAT* If patient is at the pharmacy, call can be transferred to refill team.   1. Which medications need to be refilled? (please list name of each medication and dose if known)   amLODipine  (NORVASC ) 5 MG tablet    2. Which pharmacy/location (including street and city if local pharmacy) is medication to be sent to?  [facility] [address], [place] - [address]      3. Do they need a 30 day or 90 day supply? 90 day    Pt is out of medication and has appt scheduled for 03/17/24.

## 2024-03-01 DIAGNOSIS — H353221 Exudative age-related macular degeneration, left eye, with active choroidal neovascularization: Secondary | ICD-10-CM | POA: Diagnosis not present

## 2024-03-01 DIAGNOSIS — H353212 Exudative age-related macular degeneration, right eye, with inactive choroidal neovascularization: Secondary | ICD-10-CM | POA: Diagnosis not present

## 2024-03-01 DIAGNOSIS — H35351 Cystoid macular degeneration, right eye: Secondary | ICD-10-CM | POA: Diagnosis not present

## 2024-03-01 DIAGNOSIS — H43812 Vitreous degeneration, left eye: Secondary | ICD-10-CM | POA: Diagnosis not present

## 2024-03-01 DIAGNOSIS — H353134 Nonexudative age-related macular degeneration, bilateral, advanced atrophic with subfoveal involvement: Secondary | ICD-10-CM | POA: Diagnosis not present

## 2024-03-16 ENCOUNTER — Ambulatory Visit: Attending: Cardiology | Admitting: Cardiology

## 2024-03-16 VITALS — BP 130/64 | HR 66 | Ht 61.0 in | Wt 140.8 lb

## 2024-03-16 DIAGNOSIS — I4821 Permanent atrial fibrillation: Secondary | ICD-10-CM | POA: Diagnosis not present

## 2024-03-16 DIAGNOSIS — I350 Nonrheumatic aortic (valve) stenosis: Secondary | ICD-10-CM | POA: Diagnosis not present

## 2024-03-16 DIAGNOSIS — I10 Essential (primary) hypertension: Secondary | ICD-10-CM | POA: Diagnosis not present

## 2024-03-16 DIAGNOSIS — I2510 Atherosclerotic heart disease of native coronary artery without angina pectoris: Secondary | ICD-10-CM | POA: Insufficient documentation

## 2024-03-16 NOTE — Progress Notes (Signed)
 Cardiology Office Note:  .   Date:  03/16/2024  ID:  Sylvia [name], DOB 07/28/1932, MRN [id number] PCP: Yolande Toribio MATSU, MD  [facility] Providers Cardiologist:  Redell Shallow, MD {  History of Present Illness: .   Sylvia [name] is a 89 y.o. female with history of permanent atrial fibrillation, SVT, nonobstructive CAD by heart catheterization 03/2002.     Atrial fibrillation History of failed ablation 2011 now permanent.  EF has been preserved.  CAD 03/2002 20% first obtuse marginal, otherwise no obstructive disease. 07/2016 low risk stress test  Social history  No history of tobacco, alcohol, drug use.  Physically active 5+ days per week.  Ambulates with a cane. Married and still lives at home with her husband.  Has grandchildren. She is hard of hearing     Patient with history of permanent atrial fibrillation last seen 10/2022 and without any acute complaints.  We have pursued rate control strategy and she has been chronically managed on Coumadin  for her atrial fibrillation given financial limitations.  Amlodipine  was increased to 5 mg twice daily.  No recurrences of SVT.  Today patient presents for annual follow-up.  She continues to do very excellent and has no complaints today.  She is still functional and ambulating around the house independently with a walker.  Does not have any falls and denied any exertional symptoms or shortness of breath.  She remains on Coumadin  and compliant with all of her educations.  Has not noticed any fast heart rates or concerning symptoms for her.  ROS: Denies: Chest pain, shortness of breath, orthopnea, peripheral edema, palpitations, decreased exercise intolerance, fatigue, lightheadedness.   Studies Reviewed: SABRA    EKG Interpretation Date/Time:  Wednesday March 16 2024 14:15:11 EST Ventricular Rate:  66 PR Interval:    QRS Duration:  82 QT Interval:  424 QTC Calculation: 444 R Axis:   25  Text Interpretation: Atrial  fibrillation Nonspecific T wave abnormality When compared with ECG of 21-Oct-2022 09:00, Nonspecific T wave abnormality, improved in Lateral leads Confirmed by Darryle Currier [phone]) on 03/16/2024 2:22:12 PM    Risk Assessment/Calculations:            Physical Exam:   VS:  BP 130/64   Pulse 66   Ht 5' 1 (1.549 m)   Wt 140 lb 12.8 oz (63.9 kg)   SpO2 99%   BMI 26.60 kg/m    Wt Readings from Last 3 Encounters:  03/16/24 140 lb 12.8 oz (63.9 kg)  10/21/22 147 lb (66.7 kg)  08/15/21 148 lb (67.1 kg)    GEN: Well nourished, well developed in no acute distress NECK: No JVD; No carotid bruits CARDIAC: IRRR, 3 out of 6 murmur RSB RESPIRATORY:  Clear to auscultation without rales, wheezing or rhonchi  ABDOMEN: Soft, non-tender, non-distended EXTREMITIES:  No edema; No deformity   ASSESSMENT AND PLAN: .    Permanent atrial fibrillation She remains to be rate controlled heart rate in the 60s. Continue warfarin per Coumadin  clinic.  Unable to afford DOAC. Continue Lopressor 50 mg twice daily  SVT Remote issue, continue beta-blocker.  Nonobstructive CAD - 03/2022 reported by heart catheterization No anginal complaints or equivalents.  LDL is elevated at 167 03/2023, with advanced age not sure how much she would benefit from statin therapy at this point.  Has not had history of heart attack or MI.  Murmur Suspect that she has aortic stenosis, does not exhibit any reduction in functional capacity at this  time.  Getting echocardiogram.  Echocardiogram 2023 also noted mild MR/AR.    Dispo: 41-month follow-up.  Need to be monitoring changes in functional capacity.  Signed, Thom LITTIE Sluder, PA-C

## 2024-03-16 NOTE — Patient Instructions (Signed)
 Medication Instructions:  Your physician recommends that you continue on your current medications as directed. Please refer to the Current Medication list given to you today.  *If you need a refill on your cardiac medications before your next appointment, please call your pharmacy*  Lab Work: None If you have labs (blood work) drawn today and your tests are completely normal, you will receive your results only by: MyChart Message (if you have MyChart) OR A paper copy in the mail If you have any lab test that is abnormal or we need to change your treatment, we will call you to review the results.  Testing/Procedures: Echocardiogram Your physician has requested that you have an echocardiogram. Echocardiography is a painless test that uses sound waves to create images of your heart. It provides your doctor with information about the size and shape of your heart and how well your heart's chambers and valves are working. This procedure takes approximately one hour. There are no restrictions for this procedure. Please do NOT wear cologne, perfume, aftershave, or lotions (deodorant is allowed). Please arrive 15 minutes prior to your appointment time.  Please note: We ask at that you not bring children with you during ultrasound (echo/ vascular) testing. Due to room size and safety concerns, children are not allowed in the ultrasound rooms during exams. Our front office staff cannot provide observation of children in our lobby area while testing is being conducted. An adult accompanying a patient to their appointment will only be allowed in the ultrasound room at the discretion of the ultrasound technician under special circumstances. We apologize for any inconvenience.   Follow-Up: At [facility], you and your health needs are our priority.  As part of our continuing mission to provide you with exceptional heart care, our providers are all part of one team.  This team includes your primary  Cardiologist (physician) and Advanced Practice Providers or APPs (Physician Assistants and Nurse Practitioners) who all work together to provide you with the care you need, when you need it.  Your next appointment:   6 month(s)  Provider:   Redell Shallow, MD    We recommend signing up for the patient portal called MyChart.  Sign up information is provided on this After Visit Summary.  MyChart is used to connect with patients for Virtual Visits (Telemedicine).  Patients are able to view lab/test results, encounter notes, upcoming appointments, etc.  Non-urgent messages can be sent to your provider as well.   To learn more about what you can do with MyChart, go to [email or web address].   Other Instructions None

## 2024-03-17 ENCOUNTER — Ambulatory Visit: Admitting: Cardiology

## 2024-03-17 ENCOUNTER — Other Ambulatory Visit: Payer: Self-pay | Admitting: Cardiology

## 2024-03-29 DIAGNOSIS — I4891 Unspecified atrial fibrillation: Secondary | ICD-10-CM | POA: Diagnosis not present

## 2024-05-02 ENCOUNTER — Ambulatory Visit (HOSPITAL_COMMUNITY)

## 2024-05-02 ENCOUNTER — Telehealth (HOSPITAL_COMMUNITY): Payer: Self-pay | Admitting: Cardiology

## 2024-05-02 NOTE — Telephone Encounter (Signed)
 Patients daughter called and cancelled echocardiogram due to  patients spouse just passed away. She will call us  back when pt is able to reschedule due to she is very fragile at this moment. Order will be removed from the echo WQ and when patient calls back we will reinstate the order.
